# Patient Record
Sex: Male | Born: 1975 | Race: White | Hispanic: No | Marital: Single | Smoking: Current every day smoker
Health system: Southern US, Community
[De-identification: ages and names within clinical notes are randomized; demographics above are authoritative.]

## PROBLEM LIST (undated history)

## (undated) DIAGNOSIS — E785 Hyperlipidemia, unspecified: Secondary | ICD-10-CM

## (undated) DIAGNOSIS — E039 Hypothyroidism, unspecified: Secondary | ICD-10-CM

## (undated) DIAGNOSIS — E079 Disorder of thyroid, unspecified: Secondary | ICD-10-CM

## (undated) DIAGNOSIS — E119 Type 2 diabetes mellitus without complications: Secondary | ICD-10-CM

## (undated) DIAGNOSIS — F900 Attention-deficit hyperactivity disorder, predominantly inattentive type: Secondary | ICD-10-CM

## (undated) HISTORY — DX: Type 2 diabetes mellitus without complications: E11.9

## (undated) HISTORY — DX: Hyperlipidemia, unspecified: E78.5

## (undated) HISTORY — DX: Attention-deficit hyperactivity disorder, predominantly inattentive type: F90.0

## (undated) HISTORY — PX: NO PAST SURGERIES: SHX2092

## (undated) HISTORY — DX: Disorder of thyroid, unspecified: E07.9

## (undated) HISTORY — PX: DENTAL SURGERY: SHX609

---
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
## 2006-11-04 ENCOUNTER — Other Ambulatory Visit: Payer: Self-pay

## 2006-11-04 ENCOUNTER — Emergency Department: Payer: Self-pay | Admitting: Emergency Medicine

## 2007-01-31 ENCOUNTER — Emergency Department: Payer: Self-pay | Admitting: Emergency Medicine

## 2018-02-07 ENCOUNTER — Ambulatory Visit: Payer: Self-pay | Admitting: Family Medicine

## 2018-02-11 ENCOUNTER — Ambulatory Visit: Payer: Medicaid Other | Attending: Family Medicine | Admitting: Family Medicine

## 2018-02-11 ENCOUNTER — Encounter: Payer: Self-pay | Admitting: Family Medicine

## 2018-02-11 ENCOUNTER — Telehealth: Payer: Self-pay | Admitting: Family Medicine

## 2018-02-11 VITALS — BP 108/66 | HR 62 | Temp 98.7°F | Resp 18 | Ht 70.0 in | Wt 228.0 lb

## 2018-02-11 DIAGNOSIS — G8929 Other chronic pain: Secondary | ICD-10-CM | POA: Insufficient documentation

## 2018-02-11 DIAGNOSIS — Z79899 Other long term (current) drug therapy: Secondary | ICD-10-CM | POA: Diagnosis not present

## 2018-02-11 DIAGNOSIS — E1042 Type 1 diabetes mellitus with diabetic polyneuropathy: Secondary | ICD-10-CM

## 2018-02-11 DIAGNOSIS — M25511 Pain in right shoulder: Secondary | ICD-10-CM | POA: Diagnosis not present

## 2018-02-11 DIAGNOSIS — Z833 Family history of diabetes mellitus: Secondary | ICD-10-CM | POA: Diagnosis not present

## 2018-02-11 DIAGNOSIS — E109 Type 1 diabetes mellitus without complications: Secondary | ICD-10-CM | POA: Diagnosis present

## 2018-02-11 DIAGNOSIS — E1065 Type 1 diabetes mellitus with hyperglycemia: Secondary | ICD-10-CM | POA: Diagnosis not present

## 2018-02-11 DIAGNOSIS — F419 Anxiety disorder, unspecified: Secondary | ICD-10-CM

## 2018-02-11 DIAGNOSIS — F17210 Nicotine dependence, cigarettes, uncomplicated: Secondary | ICD-10-CM | POA: Diagnosis not present

## 2018-02-11 DIAGNOSIS — Z794 Long term (current) use of insulin: Secondary | ICD-10-CM | POA: Insufficient documentation

## 2018-02-11 DIAGNOSIS — M5441 Lumbago with sciatica, right side: Secondary | ICD-10-CM

## 2018-02-11 DIAGNOSIS — E039 Hypothyroidism, unspecified: Secondary | ICD-10-CM | POA: Insufficient documentation

## 2018-02-11 DIAGNOSIS — E785 Hyperlipidemia, unspecified: Secondary | ICD-10-CM | POA: Diagnosis not present

## 2018-02-11 HISTORY — DX: Other chronic pain: G89.29

## 2018-02-11 LAB — POCT URINALYSIS DIP (CLINITEK)
Bilirubin, UA: NEGATIVE
Blood, UA: NEGATIVE
Glucose, UA: 500 mg/dL — AB
Ketones, POC UA: NEGATIVE mg/dL
Leukocytes, UA: NEGATIVE
Nitrite, UA: NEGATIVE
POC PROTEIN,UA: NEGATIVE
Spec Grav, UA: 1.015
Urobilinogen, UA: 1 U/dL
pH, UA: 6

## 2018-02-11 LAB — GLUCOSE, POCT (MANUAL RESULT ENTRY): POC Glucose: 300 mg/dL — AB (ref 70–99)

## 2018-02-11 MED ORDER — VENLAFAXINE HCL ER 75 MG PO CP24: 75.0000 mg | ORAL_CAPSULE | Freq: Every day | ORAL | 1 refills | Status: DC

## 2018-02-11 MED ORDER — GLUCOSE BLOOD VI STRP: ORAL_STRIP | 3 refills | Status: DC

## 2018-02-11 MED ORDER — DULOXETINE HCL 60 MG PO CPEP: 60.0000 mg | ORAL_CAPSULE | Freq: Every day | ORAL | 1 refills | Status: DC

## 2018-02-11 MED ORDER — ATORVASTATIN CALCIUM 40 MG PO TABS: 40.0000 mg | ORAL_TABLET | Freq: Every day | ORAL | 1 refills | Status: DC

## 2018-02-11 MED ORDER — METHOCARBAMOL 500 MG PO TABS: 1000.0000 mg | ORAL_TABLET | Freq: Three times a day (TID) | ORAL | 0 refills | Status: DC

## 2018-02-11 MED ORDER — LEVOTHYROXINE SODIUM 175 MCG PO TABS: 175.0000 ug | ORAL_TABLET | Freq: Every day | ORAL | 5 refills | Status: DC

## 2018-02-11 MED ORDER — LISINOPRIL 5 MG PO TABS: 5.0000 mg | ORAL_TABLET | Freq: Every day | ORAL | 3 refills | Status: DC

## 2018-02-11 MED ORDER — GABAPENTIN 300 MG PO CAPS: 300.0000 mg | ORAL_CAPSULE | Freq: Every day | ORAL | 3 refills | Status: DC

## 2018-02-11 MED ORDER — INSULIN NPH ISOPHANE & REGULAR (70-30) 100 UNIT/ML ~~LOC~~ SUSP: SUBCUTANEOUS | 4 refills | Status: DC

## 2018-02-11 NOTE — Telephone Encounter (Signed)
Pt called to request a refill on his test strips, call transferred to [facility]
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
## 2018-02-12 ENCOUNTER — Telehealth: Payer: Self-pay | Admitting: Family Medicine

## 2018-02-12 ENCOUNTER — Other Ambulatory Visit: Payer: Self-pay

## 2018-02-12 DIAGNOSIS — E109 Type 1 diabetes mellitus without complications: Secondary | ICD-10-CM

## 2018-02-12 LAB — COMPREHENSIVE METABOLIC PANEL WITH GFR
ALT: 17 IU/L (ref 0–44)
AST: 31 IU/L (ref 0–40)
Albumin/Globulin Ratio: 1.8 (ref 1.2–2.2)
Albumin: 4.2 g/dL (ref 4.0–5.0)
Alkaline Phosphatase: 55 IU/L (ref 39–117)
BUN/Creatinine Ratio: 11 (ref 9–20)
BUN: 16 mg/dL (ref 6–24)
Bilirubin Total: 0.5 mg/dL (ref 0.0–1.2)
CO2: 25 mmol/L (ref 20–29)
Calcium: 9.5 mg/dL (ref 8.7–10.2)
Chloride: 95 mmol/L — ABNORMAL LOW (ref 96–106)
Creatinine, Ser: 1.4 mg/dL — ABNORMAL HIGH (ref 0.76–1.27)
GFR calc Af Amer: 70 mL/min/1.73
GFR calc non Af Amer: 61 mL/min/1.73
Globulin, Total: 2.4 g/dL (ref 1.5–4.5)
Glucose: 279 mg/dL — ABNORMAL HIGH (ref 65–99)
Potassium: 4.6 mmol/L (ref 3.5–5.2)
Sodium: 134 mmol/L (ref 134–144)
Total Protein: 6.6 g/dL (ref 6.0–8.5)

## 2018-02-12 LAB — HEMOGLOBIN A1C
Est. average glucose Bld gHb Est-mCnc: 229 mg/dL
Hgb A1c MFr Bld: 9.6 % — ABNORMAL HIGH (ref 4.8–5.6)

## 2018-02-12 LAB — TSH+FREE T4
Free T4: <0.11 ng/dL — ABNORMAL LOW (ref 0.82–1.77)
TSH: 124.6 u[IU]/mL — ABNORMAL HIGH (ref 0.450–4.500)

## 2018-02-12 MED ORDER — ONETOUCH VERIO W/DEVICE KIT: PACK | 0 refills | Status: DC

## 2018-02-12 MED ORDER — ONETOUCH DELICA LANCETS 33G MISC: 2 refills | Status: DC

## 2018-02-12 MED ORDER — GLUCOSE BLOOD VI STRP: ORAL_STRIP | 2 refills | Status: DC

## 2018-02-15 ENCOUNTER — Telehealth (INDEPENDENT_AMBULATORY_CARE_PROVIDER_SITE_OTHER): Payer: Self-pay

## 2018-02-15 NOTE — Telephone Encounter (Signed)
Patient is aware of all results per Dr. [name]. Nat Christen, CMA

## 2018-02-15 NOTE — Telephone Encounter (Signed)
-----   Message from [name], MD sent at 02/15/2018 12:36 AM EST ----- Please notify patient that his glucose was elevated at 279 on his recent blood work.  Patient also with an elevation in his serum creatinine which is a measurement of his kidney function.  Patient's creatinine was 1.40 with normal being 0.76-1.27.  Patient's recent hemoglobin A1c was 9.6 which indicates poor control of his blood sugars over the past 90 days.  Patient's goal hemoglobin A1c is 7.0 which is an average blood sugar of around 140.  Patient's TSH is very elevated at 124 with normal being 0.82-4.50.  Patient needs to make sure that he is taking the levothyroxine 175 mcg daily.  If he has been taking his thyroid medication every day then he will need an increase in the dose of this medication.  Patient should make sure that he is taking the medication on an empty stomach and then waiting at least 30 minutes to an hour before he has any other food.  If patient cannot take the medication first thing in the morning then he should wait and take the medication about 2 to 3 hours after eating his evening meal.  Patient should keep follow-up appointment and if no appointment is scheduled and he may wish to make an appointment in the next 4 to 6 weeks

## 2018-02-16 ENCOUNTER — Encounter: Payer: Self-pay | Admitting: Family Medicine

## 2018-02-19 ENCOUNTER — Encounter (INDEPENDENT_AMBULATORY_CARE_PROVIDER_SITE_OTHER): Payer: Self-pay | Admitting: Family Medicine

## 2018-02-19 ENCOUNTER — Telehealth: Payer: Self-pay | Admitting: Licensed Clinical Social Worker

## 2018-02-19 ENCOUNTER — Ambulatory Visit (INDEPENDENT_AMBULATORY_CARE_PROVIDER_SITE_OTHER): Payer: Self-pay

## 2018-02-19 ENCOUNTER — Ambulatory Visit (INDEPENDENT_AMBULATORY_CARE_PROVIDER_SITE_OTHER): Payer: Medicaid Other | Admitting: Family Medicine

## 2018-02-19 DIAGNOSIS — M5441 Lumbago with sciatica, right side: Secondary | ICD-10-CM | POA: Diagnosis not present

## 2018-02-19 DIAGNOSIS — M25511 Pain in right shoulder: Secondary | ICD-10-CM | POA: Diagnosis not present

## 2018-02-19 MED ORDER — BACLOFEN 10 MG PO TABS: 10.0000 mg | ORAL_TABLET | Freq: Three times a day (TID) | ORAL | 1 refills | Status: DC | PRN

## 2018-02-19 NOTE — Telephone Encounter (Signed)
Call placed to patient to follow up on behavioral health consult. LCSWA left a message requesting a return call.

## 2018-02-19 NOTE — Progress Notes (Signed)
I saw and examined the patient with Dr. Okey Dupre and agree with assessment and plan as outlined.  Probable right shoulder impingement and mechanical low back pain.  Diabetes is poorly controlled.  We will avoid injection for now, refer him to physical therapy.  Baclofen as needed.  Could contemplate subacromial injection in the future if pain persist.  Incidentally he notes that his shoulder dislocated several times when he was younger, treated with close reduction.  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  Lumbar x-rays are unremarkable.
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
## 2018-02-22 ENCOUNTER — Telehealth: Payer: Self-pay | Admitting: Licensed Clinical Social Worker

## 2018-02-22 NOTE — Telephone Encounter (Signed)
Call placed to patient. LCSWA introduced self and explained role at [facility]. Pt was informed of consult from PCP to address financial and food insecurity. Pt shared that he has been experiencing financial strain and is interested in resources. LCSWA provided supportive resources for financial stress and food insecurity via mail, per pt request. No additional concerns noted.

## 2018-03-04 ENCOUNTER — Telehealth: Payer: Self-pay | Admitting: Pharmacist

## 2018-03-04 ENCOUNTER — Ambulatory Visit: Payer: Medicaid Other | Attending: Family Medicine | Admitting: Pharmacist

## 2018-03-04 ENCOUNTER — Other Ambulatory Visit: Payer: Self-pay | Admitting: Family Medicine

## 2018-03-04 ENCOUNTER — Encounter: Payer: Self-pay | Admitting: Pharmacist

## 2018-03-04 DIAGNOSIS — Z7901 Long term (current) use of anticoagulants: Secondary | ICD-10-CM | POA: Diagnosis not present

## 2018-03-04 DIAGNOSIS — Z833 Family history of diabetes mellitus: Secondary | ICD-10-CM | POA: Diagnosis not present

## 2018-03-04 DIAGNOSIS — Z23 Encounter for immunization: Secondary | ICD-10-CM | POA: Diagnosis not present

## 2018-03-04 DIAGNOSIS — E109 Type 1 diabetes mellitus without complications: Secondary | ICD-10-CM | POA: Diagnosis present

## 2018-03-04 DIAGNOSIS — Z794 Long term (current) use of insulin: Secondary | ICD-10-CM | POA: Insufficient documentation

## 2018-03-04 DIAGNOSIS — E1040 Type 1 diabetes mellitus with diabetic neuropathy, unspecified: Secondary | ICD-10-CM | POA: Diagnosis not present

## 2018-03-04 DIAGNOSIS — E1042 Type 1 diabetes mellitus with diabetic polyneuropathy: Secondary | ICD-10-CM

## 2018-03-04 DIAGNOSIS — E785 Hyperlipidemia, unspecified: Secondary | ICD-10-CM | POA: Insufficient documentation

## 2018-03-04 MED ORDER — INSULIN NPH ISOPHANE & REGULAR (70-30) 100 UNIT/ML ~~LOC~~ SUSP: SUBCUTANEOUS | 4 refills | Status: DC

## 2018-03-04 MED ORDER — PNEUMOCOCCAL VAC POLYVALENT 25 MCG/0.5ML IJ INJ
0.5000 mL | INJECTION | Freq: Once | INTRAMUSCULAR | Status: AC
  Administered 2018-03-04: 0.5 mL via INTRAMUSCULAR

## 2018-03-04 MED ORDER — TETANUS-DIPHTH-ACELL PERTUSSIS 5-2.5-18.5 LF-MCG/0.5 IM SUSP
0.5000 mL | Freq: Once | INTRAMUSCULAR | Status: AC
  Administered 2018-03-04: 0.5 mL via INTRAMUSCULAR

## 2018-03-04 NOTE — Telephone Encounter (Signed)
OK. I will place the referrals. Thank you for your assistance with the patients

## 2018-03-04 NOTE — Telephone Encounter (Signed)
Dr. [name],   Saw this patient today for DM management. He reported hypoglycemia overnight with higher sugars during the day. Adjusted his Novolin 70/30 accordingly. We also administered Adacel and Pneumovax.   Of note, pt expressed interest in CBGM and an insulin pump. I told him I would make you aware. He would benefit from an Endo referral. Additionally, he is requesting a referral to an ophthalmologist.   Thanks for all you do!

## 2018-03-04 NOTE — Progress Notes (Signed)
    S:    PCP: Dr. [name]  No chief complaint on file.  43 YO caucasian male with PMH of T1DM, hyperlipidemia, neuropathy.  Patient arrives in good spirits. Presents for diabetes management at the request of Dr. [name]. Patient was referred on 02/11/18. He has a history of very labile blood sugars - reports that he has tried Lantus in the past. Reports that his sugars would not come down out of the 600s with Lantus 60 units daily.   Patient reports diabetes was diagnosed between age [age].   Family/Social History:  - DM (sister, mother, MGM) - 0.5 PPD - Denies using alcohol  Insurance coverage/medication affordability:  [place] [employer]  Patient denies adherence with medications.  Current diabetes medications include:  - Novolin 70/30 38 units qAM and 40 units qPM (pt reports taking 45 qAM and 43 qPM) Current hypertension/ASCVD benefit medications include: - Lisinopril 2.5 mg daily - Atorvastatin 20 mg daily (takes 1/2 of the 40 mg tablet)  Patient reports hypoglycemic events. Pt reports overnight sugars 20s - 60s.   Patient reported dietary habits: Eats 3 meals/day - Fast food based diet  Patient-reported exercise habits:  - Walks throughout the day   Patient denies nocturia.  Patient reports neuropathy.  Patient denies visual changes. Patient reports self foot exams.   O:  POCT glucose: 372 Home fasting CBG: 60 - 200s Home before lunch: 300s Home before dinner: 100s - 300s Home before bedtime/overnight: 20s - 100s  Lab Results  Component Value Date   HGBA1C 9.6 (H) 02/11/2018   There were no vitals filed for this visit.  Lipid Panel  No results found for: CHOL, TRIG, HDL, CHOLHDL, VLDL, LDLCALC, LDLDIRECT   Clinical ASCVD: No  The ASCVD Risk score Mikey Bussing DC Jr., et al., 2013) failed to calculate for the following reasons:   Cannot find a previous HDL lab   Cannot find a previous total cholesterol lab   A/P: Diabetes longstanding currently uncontrolled. Patient is  able to verbalize appropriate hypoglycemia management plan. Patient is not adherent with medication and takes insulin differently than prescribed. Control is suboptimal due to dietary indiscretion.   Discussed need for Endo referral with patient. Will message his PCP regarding this. Novolin 70/30 is not ideal but pt does not wish to change insulins at this time.   -Decreased dose of Novolin 70/30 in the evening from 43 to 40 units.  -Increased dose of Novolin 70/30 in the morning from 45 to 47 units.  -Extensively discussed pathophysiology of DM, recommended lifestyle interventions, dietary effects on glycemic control -Counseled on s/sx of and management of hypoglycemia -HM: tetanus, flu, PNA (pneumovax) indicated; Pneumovax and Adacel given -Next A1C anticipated 04/2018.   ASCVD risk - primary prevention in patient with DM. Will get lipid panel - at least moderate intensity indicated at this time. Pt is tolerating atorvastatin well.  -Continued atorvastatin 20 mg.   Written patient instructions provided.  Total time in face to face counseling 30 minutes.   Follow up with PCP 04/08/2018.     Patient seen with: Beckey Rutter, PharmD Candidate  [employer]  Class of 2022  Benard Halsted, PharmD, [employer] [phone]

## 2018-03-04 NOTE — Progress Notes (Signed)
Patient ID: Ronald Banks, male   DOB: [birth year], 43 y.o.   MRN: [id number]   Patient was seen earlier today by clinical pharmacist in follow-up of type 1 diabetes.  Per note from clinical pharmacist, patient is interested in obtaining an insulin pump and patient also expressed need for diabetic eye exam.  Orders will be placed for endocrinology referral and ophthalmology referral.

## 2018-03-04 NOTE — Patient Instructions (Signed)
Thank you for coming to see me today. Please do the following:  1. Please take 47 units in the morning before breakfast and 40 units before dinner.  2. Continue checking blood sugars at home.  3. Continue making the lifestyle changes we've discussed together during our visit. Diet and exercise play a significant role in improving your blood sugars.  4. Follow-up with your PCP.    Hypoglycemia or low blood sugar:   Low blood sugar can happen quickly and may become an emergency if not treated right away.   While this shouldn't happen often, it can be brought upon if you skip a meal or do not eat enough. Also, if your insulin or other diabetes medications are dosed too high, this can cause your blood sugar to go to low.   Warning signs of low blood sugar include: 1. Feeling shaky or dizzy 2. Feeling weak or tired  3. Excessive hunger 4. Feeling anxious or upset  5. Sweating even when you aren't exercising  What to do if I experience low blood sugar? 1. Check your blood sugar with your meter. If lower than 70, proceed to step 2.  2. Treat with 3-4 glucose tablets or 3 packets of regular sugar. If these aren't around, you can try hard candy. Yet another option would be to drink 4 ounces of fruit juice or 6 ounces of REGULAR soda.  3. Re-check your sugar in 15 minutes. If it is still below 70, do what you did in step 2 again. If has come back up, go ahead and eat a snack or small meal at this time.

## 2018-03-05 ENCOUNTER — Other Ambulatory Visit: Payer: Self-pay

## 2018-03-05 ENCOUNTER — Ambulatory Visit: Payer: Medicaid Other | Attending: Family Medicine

## 2018-03-05 ENCOUNTER — Other Ambulatory Visit: Payer: Self-pay | Admitting: Pharmacist

## 2018-03-05 DIAGNOSIS — M25511 Pain in right shoulder: Secondary | ICD-10-CM | POA: Diagnosis not present

## 2018-03-05 DIAGNOSIS — R293 Abnormal posture: Secondary | ICD-10-CM | POA: Insufficient documentation

## 2018-03-05 DIAGNOSIS — M62830 Muscle spasm of back: Secondary | ICD-10-CM

## 2018-03-05 DIAGNOSIS — M545 Low back pain, unspecified: Secondary | ICD-10-CM

## 2018-03-05 DIAGNOSIS — G8929 Other chronic pain: Secondary | ICD-10-CM | POA: Insufficient documentation

## 2018-03-05 LAB — LIPID PANEL
Chol/HDL Ratio: 5.1 ratio — ABNORMAL HIGH (ref 0.0–5.0)
Cholesterol, Total: 298 mg/dL — ABNORMAL HIGH (ref 100–199)
HDL: 59 mg/dL
LDL Calculated: 214 mg/dL — ABNORMAL HIGH (ref 0–99)
Triglycerides: 124 mg/dL (ref 0–149)
VLDL Cholesterol Cal: 25 mg/dL (ref 5–40)

## 2018-03-05 LAB — MICROALBUMIN / CREATININE URINE RATIO
Creatinine, Urine: 91.2 mg/dL
Microalb/Creat Ratio: 7 mg/g{creat} (ref 0–29)
Microalbumin, Urine: 6 ug/mL

## 2018-03-05 MED ORDER — ACCU-CHEK GUIDE W/DEVICE KIT: 1.0000 | PACK | Freq: Every day | 0 refills | Status: DC

## 2018-03-05 MED ORDER — ACCU-CHEK FASTCLIX LANCETS MISC: 11 refills | Status: DC

## 2018-03-05 MED ORDER — GLUCOSE BLOOD VI STRP: ORAL_STRIP | 11 refills | Status: DC

## 2018-03-05 NOTE — Therapy (Signed)
[place], [place], [address] Phone: [phone]   Fax:  [phone]  Physical Therapy Evaluation  Patient Details  Name: Ronald Banks MRN: [id number] Date of Birth: 08-26-1975 Referring Provider (PT): Eunice Blase, MD   Encounter Date: 03/05/2018  PT End of Session - 03/05/18 1057    Visit Number  1    Number of Visits  12    Date for PT Re-Evaluation  04/26/18    Authorization Type  MCD    PT Start Time  1050    PT Stop Time  1140    PT Time Calculation (min)  50 min    Activity Tolerance  Patient tolerated treatment well    Behavior During Therapy  [facility] for tasks assessed/performed       Past Medical History:  Diagnosis Date  . Diabetes mellitus without complication ([place])    type 1     Past Surgical History:  Procedure Laterality Date  . NO PAST SURGERIES      There were no vitals filed for this visit.   Subjective Assessment - 03/05/18 1106    Subjective  He reports no injury.  Pain runs down RT arm.   MRI to assess this.       Limitations  Lifting    How long can you sit comfortably?  10 min    How long can you stand comfortably?  10 min    How long can you walk comfortably?  30-60 min    Diagnostic tests  xray:     Patient Stated Goals  He wants to get it right.     Currently in Pain?  Yes    Pain Score  8     Pain Location  Back    Pain Orientation  Lower   center   Pain Descriptors / Indicators  --   painful   Pain Type  Chronic pain    Pain Onset  More than a month ago    Pain Frequency  Constant    Aggravating Factors   lifting walk/stand /sit    Pain Relieving Factors  Does not do anything.  better with arm across chest    Multiple Pain Sites  Yes    Pain Score  7    Pain Location  Shoulder    Pain Orientation  Right    Pain Descriptors / Indicators  Radiating    Pain Type  Chronic pain    Pain Radiating Towards  RT arm along radial aspe t RT forarm to tips ot RT  fingers    Pain Onset  More than a month ago    Pain Frequency  Constant    Aggravating Factors   reaching and generaly using RT arm    Pain Relieving Factors  nothing         [facility] PT Assessment - 03/05/18 0001      Assessment   Medical Diagnosis  chronic RT shoulder and back pain    Referring Provider (PT)  Eunice Blase, MD    Onset Date/Surgical Date  --   5 months ago shoulder, 9 months ago   Hand Dominance  Right    Next MD Visit  04/08/2018    Prior Therapy  No      Precautions   Precautions  None      Restrictions   Weight Bearing Restrictions  No      Balance Screen   Has the patient fallen  in the past 6 months  Yes    How many times?  1   climbing a ladder   Has the patient had a decrease in activity level because of a fear of falling?   No    Is the patient reluctant to leave their home because of a fear of falling?   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      Cognition   Overall Cognitive Status  Within Functional Limits for tasks assessed      Posture/Postural Control   Posture Comments  LT shoulder higher than RT, appears slight shift to Lt  possible scoliosis      ROM / Strength   AROM / PROM / Strength  AROM;Strength;PROM      AROM   AROM Assessment Site  Lumbar;Shoulder;Cervical    Right/Left Shoulder  Right    Right Shoulder Flexion  110 Degrees    Right Shoulder ABduction  85 Degrees    Right Shoulder Internal Rotation  57 Degrees    Right Shoulder External Rotation  45 Degrees    Cervical Flexion  60    Cervical Extension  50    Cervical - Right Side Bend  42    Cervical - Left Side Bend  40    Cervical - Right Rotation  62   with SB and ext pain RT neck and to shoulder   Cervical - Left Rotation  62    Lumbar Flexion  55    Lumbar Extension  20    Lumbar - Right Side Bend  10     Lumbar - Left Side Bend  10      Strength   Overall Strength Comments  weakness of rT trap with RT trap elongated.    UE strength appears WNL thouugh hesitant with biceps testing  probably due to pain    poor to fair abdominals         Flexibility   Soft Tissue Assessment /Muscle Length  yes    Hamstrings  70 degrees bilaterally                Objective measurements completed on examination: See above findings.              PT Education - 03/05/18 1149    Education Details  POc , HEP    Person(s) Educated  Patient    Methods  Explanation;Tactile cues;Verbal cues;Handout;Demonstration    Comprehension  Verbalized understanding;Returned demonstration       PT Short Term Goals - 03/05/18 1059      PT SHORT TERM GOAL #1   Title  He will be independent with all initial HEP issued    Baseline  no progfram    Time  2    Period  Weeks    Status  New        PT Long Term Goals - 03/05/18 1059      PT LONG TERM GOAL #1   Title  He will be indpendent with all hEp issued    Baseline  He is independent with initial HEP     Time  6    Period  Weeks    Status  New      PT LONG TERM GOAL #2   Title  He will report 50% decr RT shoulder pain and able to reach overhead smoothly    Baseline  unable to each overhead and has severe pain    Time  6    Period  Weeks    Status  New      PT LONG TERM GOAL #3   Title  He will report 50% decr back pain    Baseline  severe back pain with sitting for 5 min    Time  6    Period  Weeks    Status  New      PT LONG TERM GOAL #4   Title  He will be able to sit with support without incr back pain     Baseline  severe at 30 min    Time  6    Period  Weeks    Status  New      PT LONG TERM GOAL #5   Title  He will be able to stand and walk for 20-30 min before back pain increases    Baseline  5 min at eval    Time  6    Period  Weeks    Status  New             Plan - 03/05/18 1057    Clinical Impression  Statement  Mr Reasons  presents with chronic pain limiting use of RT arm and all activity due to back pain.  He has limited motion of RT shoulder and lower back .  He is stiff in lowest lumbar spine but with good mobility in  part of spine above this.  Pelvis and spine appear level.  He  has fair abdominals and strength WNL in arm and legs.   He does have some asymetry with slpoing RT shoulder compared to LT shoulder and forward head posture.  Affect does not indicate high pain level. Skilled PT should help improve function in shoulder and back.     History and Personal Factors relevant to plan of care:  diabetes chronic pain, does not medicate even with higher rpeorted levels of pain.  pevious dislocations RT shoulder when younger.     Clinical Presentation  Unstable    Clinical Presentation due to:  shoulder and back pain limitiing fucntion     Clinical Decision Making  Low    Rehab Potential  Good    PT Frequency  --   3 visits   PT Duration  2 weeks   then 2x/week for 4 weeks    PT Treatment/Interventions  Taping;Passive range of motion;Manual techniques;Patient/family education;Therapeutic exercise;Cryotherapy;Moist Heat;Ultrasound;Iontophoresis 18m/ml Dexamethasone;Electrical Stimulation;Dry needling    PT Next Visit Plan  progress HEP and use modalities /manual as needed for pain    PT Home Exercise Plan  shoulder table and wall slides, reaching behind back       Consulted and Agree with Plan of Care  Patient       Patient will benefit from skilled therapeutic intervention in order to improve the following deficits and impairments:  Pain, Postural dysfunction, Impaired UE functional use, Decreased activity tolerance, Decreased strength, Increased muscle spasms, Improper body mechanics, Decreased range of motion  Visit Diagnosis: Chronic right shoulder pain  Chronic bilateral low back pain without sciatica  Muscle spasm of back  Abnormal posture     Problem List Patient Active  Problem List   Diagnosis Date Noted  . Diabetic peripheral neuropathy associated with type 1 diabetes mellitus ([place] 02/11/2018  .  Hyperlipidemia LDL goal <70 02/11/2018  . Hypothyroidism 02/11/2018  . Chronic right shoulder pain 02/11/2018  . Chronic midline low back pain with right-sided sciatica 02/11/2018  . Anxiety 02/11/2018    Darrel Hoover  PT 03/05/2018, 11:50 AM  [facility] [address] [place], [place], [address] Phone: [phone]   Fax:  [phone]  Name: Ozan Maclay MRN: [id number] Date of Birth: 1976/01/05

## 2018-03-05 NOTE — Patient Instructions (Signed)
Wall and table slides x 3-5 rps 3x/day for 5-10 sec,  Behind back 3-5 reps  RT arm  3x/day 5-010 sec

## 2018-03-07 ENCOUNTER — Other Ambulatory Visit: Payer: Self-pay | Admitting: Family Medicine

## 2018-03-07 DIAGNOSIS — E785 Hyperlipidemia, unspecified: Secondary | ICD-10-CM

## 2018-03-07 MED ORDER — ATORVASTATIN CALCIUM 40 MG PO TABS: 40.0000 mg | ORAL_TABLET | Freq: Every day | ORAL | 1 refills | Status: DC

## 2018-03-07 NOTE — Progress Notes (Signed)
Patient ID: Ronald Banks, male   DOB: April 09, 1975, 43 y.o.   MRN: [id number]   Patient's recent lipid panel with elevated total cholesterol and LDL and patient will be notified by CMA to increase his lipitor to 40 mg and new RX will be sent into patient's pharmacy.

## 2018-03-08 ENCOUNTER — Telehealth: Payer: Self-pay | Admitting: *Deleted

## 2018-03-08 NOTE — Telephone Encounter (Signed)
-----   Message from [name], MD sent at 03/07/2018 11:02 PM EST ----- Normal urine microalbumin; lipid panel with elevated cholesterol of 298 and LDLelevated at 214. Please increase atorvastatin to 40 mg -one whole pill daily. New RX will be sent to patient's pharmacy

## 2018-03-08 NOTE — Telephone Encounter (Signed)
Patient verified DOB Patient is aware of kidney function being normal but needing to increase to 40 mg of atorvastatin daily. Patient is aware of this change and will follow up as planned.

## 2018-03-12 ENCOUNTER — Ambulatory Visit: Payer: Medicaid Other

## 2018-03-14 ENCOUNTER — Ambulatory Visit: Payer: Medicaid Other

## 2018-03-14 LAB — HM DIABETES EYE EXAM

## 2018-03-19 ENCOUNTER — Ambulatory Visit: Payer: Medicaid Other

## 2018-03-19 DIAGNOSIS — M545 Low back pain: Secondary | ICD-10-CM

## 2018-03-19 DIAGNOSIS — M25511 Pain in right shoulder: Secondary | ICD-10-CM | POA: Diagnosis not present

## 2018-03-19 DIAGNOSIS — G8929 Other chronic pain: Secondary | ICD-10-CM

## 2018-03-19 DIAGNOSIS — M62830 Muscle spasm of back: Secondary | ICD-10-CM

## 2018-03-19 DIAGNOSIS — R293 Abnormal posture: Secondary | ICD-10-CM

## 2018-03-19 NOTE — Therapy (Addendum)
[place], [place], [address] Phone: [phone]   Fax:  [phone]  Physical Therapy Treatment  Patient Details  Name: Ronald Banks MRN: [id number] Date of Birth: [birth year] Referring Provider (PT): Eunice Blase, MD   Encounter Date: 03/19/2018  PT End of Session - 03/19/18 1014    Visit Number  2   Number of Visits  12    Date for PT Re-Evaluation  04/26/18    Authorization Type  MCD    Authorization Time Period  2/17 to 03/31/18    Authorization - Visit Number  1    Authorization - Number of Visits  3    PT Start Time  5188    PT Stop Time  1112    PT Time Calculation (min)  57 min    Activity Tolerance  Patient tolerated treatment well    Behavior During Therapy  [facility] for tasks assessed/performed       Past Medical History:  Diagnosis Date  . Diabetes mellitus without complication ([place])    type 1     Past Surgical History:  Procedure Laterality Date  . NO PAST SURGERIES      There were no vitals filed for this visit.  Subjective Assessment - 03/19/18 1017    Subjective  Shoulder is some better but still limited in ROM.  HAve worked on exercise    Pain Score  7     Pain Location  Back    Pain Orientation  Lower    Pain Descriptors / Indicators  --   pain   Pain Type  Chronic pain    Pain Onset  More than a month ago    Pain Frequency  Constant    Aggravating Factors   lift stand walk    Pain Relieving Factors  None    Pain Score  6    Pain Location  Shoulder    Pain Orientation  Right    Pain Descriptors / Indicators  Radiating    Pain Type  Chronic pain    Pain Onset  More than a month ago    Pain Frequency  Constant    Aggravating Factors   using R arm    Pain Relieving Factors  exercise                       [facility] Adult PT Treatment/Exercise - 03/19/18 0001      Exercises   Exercises  Shoulder;Lumbar      Lumbar Exercises: Stretches   Single Knee to  Chest Stretch  Right;Left;30 seconds      Lumbar Exercises: Supine   Ab Set  10 reps    Pelvic Tilt  10 reps    Pelvic Tilt Limitations  much cuing initially    Clam  10 reps    Clam Limitations  RT and left cued to keep abs set      Lumbar Exercises: Prone   Straight Leg Raise  10 reps    Straight Leg Raises Limitations  RT/Lt       Lumbar Exercises: Quadruped   Madcat/Old Horse  10 reps    Madcat/Old Horse Limitations  verbal and manual cues.       Shoulder Exercises: Prone   Other Prone Exercises  scapula retraction  x 12 and scapula elevatioth arm over side of bed and with arm by side n x 12 RT  Shoulder Exercises: Standing   Extension  15 reps    Theraband Level (Shoulder Extension)  Level 2 (Red)    Row  Both;15 reps    Theraband Level (Shoulder Row)  Level 2 (Red)    Other Standing Exercises  scapula elevations and retraction x 15 each      Shoulder Exercises: Pulleys   Flexion  2 minutes      Shoulder Exercises: ROM/Strengthening   UBE (Upper Arm Bike)  L1 5 min forward      Modalities   Modalities  Moist Heat      Moist Heat Therapy   Number Minutes Moist Heat  15 Minutes    Moist Heat Location  Shoulder;Lumbar Spine   supine     Manual Therapy   Manual Therapy  Joint mobilization    Manual therapy comments  Gr 2-3  throacic and lumbar spine               PT Short Term Goals - 03/05/18 1059      PT SHORT TERM GOAL #1   Title  He will be independent with all initial HEP issued    Baseline  no progfram    Time  2    Period  Weeks    Status  New        PT Long Term Goals - 03/05/18 1059      PT LONG TERM GOAL #1   Title  He will be indpendent with all hEp issued    Baseline  He is independent with initial HEP     Time  6    Period  Weeks    Status  New      PT LONG TERM GOAL #2   Title  He will report 50% decr RT shoulder pain and able to reach overhead smoothly    Baseline  unable to each overhead and has severe pain    Time   6    Period  Weeks    Status  New      PT LONG TERM GOAL #3   Title  He will report 50% decr back pain    Baseline  severe back pain with sitting for 5 min    Time  6    Period  Weeks    Status  New      PT LONG TERM GOAL #4   Title  He will be able to sit with support without incr back pain     Baseline  severe at 30 min    Time  6    Period  Weeks    Status  New      PT LONG TERM GOAL #5   Title  He will be able to stand and walk for 20-30 min before back pain increases    Baseline  5 min at eval    Time  6    Period  Weeks    Status  New            Plan - 03/19/18 1015    Clinical Impression Statement  He was able to do all exercise without incr pain. Shoulder ROm improved    Will add to HEp for back and shoulder next session    PT Treatment/Interventions  Taping;Passive range of motion;Manual techniques;Patient/family education;Therapeutic exercise;Cryotherapy;Moist Heat;Ultrasound;Iontophoresis 54m/ml Dexamethasone;Electrical Stimulation;Dry needling    PT Next Visit Plan  Cont heat and strengthening , Sdd to HEP for back and  bands to shoulder ,  prone scapula    PT Home Exercise Plan  shoulder table and wall slides, reaching behind back       Consulted and Agree with Plan of Care  Patient       Patient will benefit from skilled therapeutic intervention in order to improve the following deficits and impairments:  Pain, Postural dysfunction, Impaired UE functional use, Decreased activity tolerance, Decreased strength, Increased muscle spasms, Improper body mechanics, Decreased range of motion, Impaired vision/preception  Visit Diagnosis: Chronic right shoulder pain  Chronic bilateral low back pain without sciatica  Muscle spasm of back  Abnormal posture     Problem List Patient Active Problem List   Diagnosis Date Noted  . Diabetic peripheral neuropathy associated with type 1 diabetes mellitus ([place]) 02/11/2018  . Hyperlipidemia LDL goal <70 02/11/2018   . Hypothyroidism 02/11/2018  . Chronic right shoulder pain 02/11/2018  . Chronic midline low back pain with right-sided sciatica 02/11/2018  . Anxiety 02/11/2018    Darrel Hoover  PT 03/19/2018, 11:05 AM  [facility] [address] [place], [place], [address] Phone: [phone]   Fax:  [phone]  Name: Ronald Banks MRN: [id number] Date of Birth: 30-Oct-1975

## 2018-03-21 ENCOUNTER — Ambulatory Visit: Payer: Medicaid Other

## 2018-03-21 DIAGNOSIS — M62830 Muscle spasm of back: Secondary | ICD-10-CM

## 2018-03-21 DIAGNOSIS — R293 Abnormal posture: Secondary | ICD-10-CM

## 2018-03-21 DIAGNOSIS — M25511 Pain in right shoulder: Secondary | ICD-10-CM | POA: Diagnosis not present

## 2018-03-21 DIAGNOSIS — M545 Low back pain: Secondary | ICD-10-CM

## 2018-03-21 DIAGNOSIS — G8929 Other chronic pain: Secondary | ICD-10-CM

## 2018-03-21 NOTE — Therapy (Addendum)
[place], [place], [address] Phone: [phone]   Fax:  [phone]  Physical Therapy Treatment/Discharge   Patient Details  Name: Ronald Banks MRN: [id number] Date of Birth: Sep 18, 1975 Referring Provider (PT): Eunice Blase, MD   Encounter Date: 03/21/2018  PT End of Session - 03/21/18 0919    Visit Number  3    Number of Visits  12    Date for PT Re-Evaluation  04/26/18    Authorization Type  MCD    Authorization Time Period  2/17 to 03/31/18    Authorization - Visit Number  2    Authorization - Number of Visits  3    PT Start Time  0920    PT Stop Time  1012    PT Time Calculation (min)  52 min    Activity Tolerance  Patient tolerated treatment well    Behavior During Therapy  [name] for tasks assessed/performed       Past Medical History:  Diagnosis Date  . Diabetes mellitus without complication ([place])    type 1     Past Surgical History:  Procedure Laterality Date  . NO PAST SURGERIES      There were no vitals filed for this visit.  Subjective Assessment - 03/21/18 0921    Subjective  Shoulder still better. Did the exercises back about same. Sore from exercises. Stretches don't help much for back.  Dislocated RT shoulder many times but not in past 5 years.          [facility] PT Assessment - 03/21/18 0001      Assessment   Medical Diagnosis  chronic RT shoulder and back pain    Referring Provider (PT)  Eunice Blase, MD      AROM   Right Shoulder Flexion  125 Degrees    Right Shoulder ABduction  95 Degrees    Right Shoulder Internal Rotation  60 Degrees    Right Shoulder External Rotation  52 Degrees                   [facility] Adult PT Treatment/Exercise - 03/21/18 0001      Lumbar Exercises: Aerobic   UBE (Upper Arm Bike)  l2 3 min forward , 3 min back      Lumbar Exercises: Supine   Other Supine Lumbar Exercises  isometric adduction x 10 10 sec post manual      Shoulder Exercises: Supine   Protraction  Right    Protraction Weight (lbs)  5    Protraction Limitations  25 reps      Shoulder Exercises: Standing   ABduction  Both;20 reps    Theraband Level (Shoulder ABduction)  Level 2 (Red)    Extension  Both;20 reps    Theraband Level (Shoulder Extension)  Level 3 (Green)    Row  Both;20 reps    Theraband Level (Shoulder Row)  Level 3 (Green)      Moist Heat Therapy   Number Minutes Moist Heat  12 Minutes   lumbar also with exercises   Moist Heat Location  Shoulder;Lumbar Spine      Manual Therapy   Manual Therapy  Muscle Energy Technique;Manual Traction    Manual therapy comments  Gr 2-3  lower lumbar spine, and LT lateral sacrum for rotated sacrum    Manual Traction  each leg long axis  both x 100 reps and RT 30 extra.     Muscle Energy Technique  For RT  anterior rotation , andd out flare             PT Education - 03/21/18 0932    Education Details  Improtance of strength to orient Gibbon joint and decr tissue strain in shoulder    Person(s) Educated  Patient    Methods  Explanation;Other (comment)   used model   Comprehension  Verbalized understanding       PT Short Term Goals - 03/21/18 1007      PT SHORT TERM GOAL #1   Title  He will be independent with all initial HEP issued    Status  Achieved        PT Long Term Goals - 03/21/18 1008      PT LONG TERM GOAL #1   Title  He will be indpendent with all hEp issued    Baseline  independnet with initial HEp  Need to progress strength RT shoulder and add for lower back    Time  6    Period  Weeks    Status  On-going      PT LONG TERM GOAL #2   Title  He will report 50% decr RT shoulder pain and able to reach overhead smoothly    Baseline  able to reach overhead but still painful and reports easy fatigue    Time  6    Period  Weeks    Status  On-going      PT LONG TERM GOAL #3   Title  He will report 50% decr back pain    Baseline  no change    Time  6     Period  Weeks    Status  On-going      PT LONG TERM GOAL #4   Title  He will be able to sit with support without incr back pain     Baseline  severe at 30 min    Status  On-going      PT LONG TERM GOAL #5   Title  He will be able to stand and walk for 20-30 min before back pain increases    Baseline  5 min at eval    Status  On-going            Plan - 03/21/18 0920    Clinical Impression Statement  Shoulder improving and he needs to continue strength and scapula stability . Lower back no change. Pelvic asymetries noted and treated. No worse.   May benefit from  dry needling.  Should benefitr from continued PT . 8-12 sessions    PT Treatment/Interventions  Taping;Passive range of motion;Manual techniques;Patient/family education;Therapeutic exercise;Cryotherapy;Moist Heat;Ultrasound;Iontophoresis 35m/ml Dexamethasone;Electrical Stimulation;Dry needling    PT Next Visit Plan  Cont heat and strengthening , add to HEP for back and  bands to shoulder , prone scapula    PT Home Exercise Plan  shoulder table and wall slides, reaching behind back       Consulted and Agree with Plan of Care  Patient       Patient will benefit from skilled therapeutic intervention in order to improve the following deficits and impairments:  Pain, Postural dysfunction, Impaired UE functional use, Decreased activity tolerance, Decreased strength, Increased muscle spasms, Improper body mechanics, Decreased range of motion, Impaired vision/preception  Visit Diagnosis: Chronic right shoulder pain  Chronic bilateral low back pain without sciatica  Muscle spasm of back  Abnormal posture   PHYSICAL THERAPY DISCHARGE SUMMARY  Visits from Start of Care: 3  Current  functional level related to goals / functional outcomes: Patient reports shoulder was better     Remaining deficits: Unknown/ declined to come back    Education / Equipment: HEP   Plan: Patient agrees to discharge.  Patient goals were met.  Patient is being discharged due to not returning since the last visit.  ?????       Problem List Patient Active Problem List   Diagnosis Date Noted  . Diabetic peripheral neuropathy associated with type 1 diabetes mellitus ([place]) 02/11/2018  . Hyperlipidemia LDL goal <70 02/11/2018  . Hypothyroidism 02/11/2018  . Chronic right shoulder pain 02/11/2018  . Chronic midline low back pain with right-sided sciatica 02/11/2018  . Anxiety 02/11/2018    Ronald Banks  PT 03/21/2018, 10:11 AM  [facility] [address] [place], [place], [address] Phone: [phone]   Fax:  [phone]  Name: Ronald Banks MRN: [id number] Date of Birth: [birth year]

## 2018-04-01 ENCOUNTER — Encounter: Payer: Self-pay | Admitting: Emergency Medicine

## 2018-04-01 ENCOUNTER — Emergency Department
Admission: EM | Admit: 2018-04-01 | Discharge: 2018-04-01 | Disposition: A | Discharge status: Home / Self Care | Payer: Medicaid Other | Attending: Emergency Medicine | Admitting: Emergency Medicine

## 2018-04-01 ENCOUNTER — Other Ambulatory Visit: Payer: Self-pay

## 2018-04-01 ENCOUNTER — Emergency Department: Payer: Medicaid Other

## 2018-04-01 DIAGNOSIS — Z79899 Other long term (current) drug therapy: Secondary | ICD-10-CM | POA: Diagnosis not present

## 2018-04-01 DIAGNOSIS — B349 Viral infection, unspecified: Secondary | ICD-10-CM | POA: Diagnosis not present

## 2018-04-01 DIAGNOSIS — E109 Type 1 diabetes mellitus without complications: Secondary | ICD-10-CM | POA: Diagnosis not present

## 2018-04-01 DIAGNOSIS — F17200 Nicotine dependence, unspecified, uncomplicated: Secondary | ICD-10-CM | POA: Diagnosis not present

## 2018-04-01 DIAGNOSIS — R0789 Other chest pain: Secondary | ICD-10-CM | POA: Diagnosis not present

## 2018-04-01 DIAGNOSIS — E039 Hypothyroidism, unspecified: Secondary | ICD-10-CM | POA: Insufficient documentation

## 2018-04-01 LAB — BASIC METABOLIC PANEL
Anion gap: 9 (ref 5–15)
BUN: 13 mg/dL (ref 6–20)
CO2: 25 mmol/L (ref 22–32)
Calcium: 8.7 mg/dL — ABNORMAL LOW (ref 8.9–10.3)
Chloride: 98 mmol/L (ref 98–111)
Creatinine, Ser: 1.23 mg/dL (ref 0.61–1.24)
GFR calc Af Amer: >60 mL/min (ref >60)
GFR calc non Af Amer: >60 mL/min (ref >60)
Glucose, Bld: 277 mg/dL — ABNORMAL HIGH (ref 70–99)
Potassium: 4.4 mmol/L (ref 3.5–5.1)
SODIUM: 132 mmol/L — AB (ref 135–145)

## 2018-04-01 LAB — CBC
HCT: 49 % (ref 39.0–52.0)
Hemoglobin: 16.5 g/dL (ref 13.0–17.0)
MCH: 31.6 pg (ref 26.0–34.0)
MCHC: 33.7 g/dL (ref 30.0–36.0)
MCV: 93.9 fL (ref 80.0–100.0)
NRBC: 0 % (ref 0.0–0.2)
Platelets: 265 10*3/uL (ref 150–400)
RBC: 5.22 MIL/uL (ref 4.22–5.81)
RDW: 13.3 % (ref 11.5–15.5)
WBC: 16.7 10*3/uL — AB (ref 4.0–10.5)

## 2018-04-01 LAB — GLUCOSE, CAPILLARY: Glucose-Capillary: 287 mg/dL — ABNORMAL HIGH (ref 70–99)

## 2018-04-01 LAB — TROPONIN I: Troponin I: <0.03 ng/mL (ref <0.03)

## 2018-04-01 IMAGING — CR CHEST - 2 VIEW
1 series · 2 of 2 positions shown · non-contrast
Comparison: None.

CLINICAL DATA: Chest pain

EXAM:
CHEST - 2 VIEW

[Series 1: dg chest 2 view · 0.14mm/px · 2 of 2 slices shown]
[im 1/2]
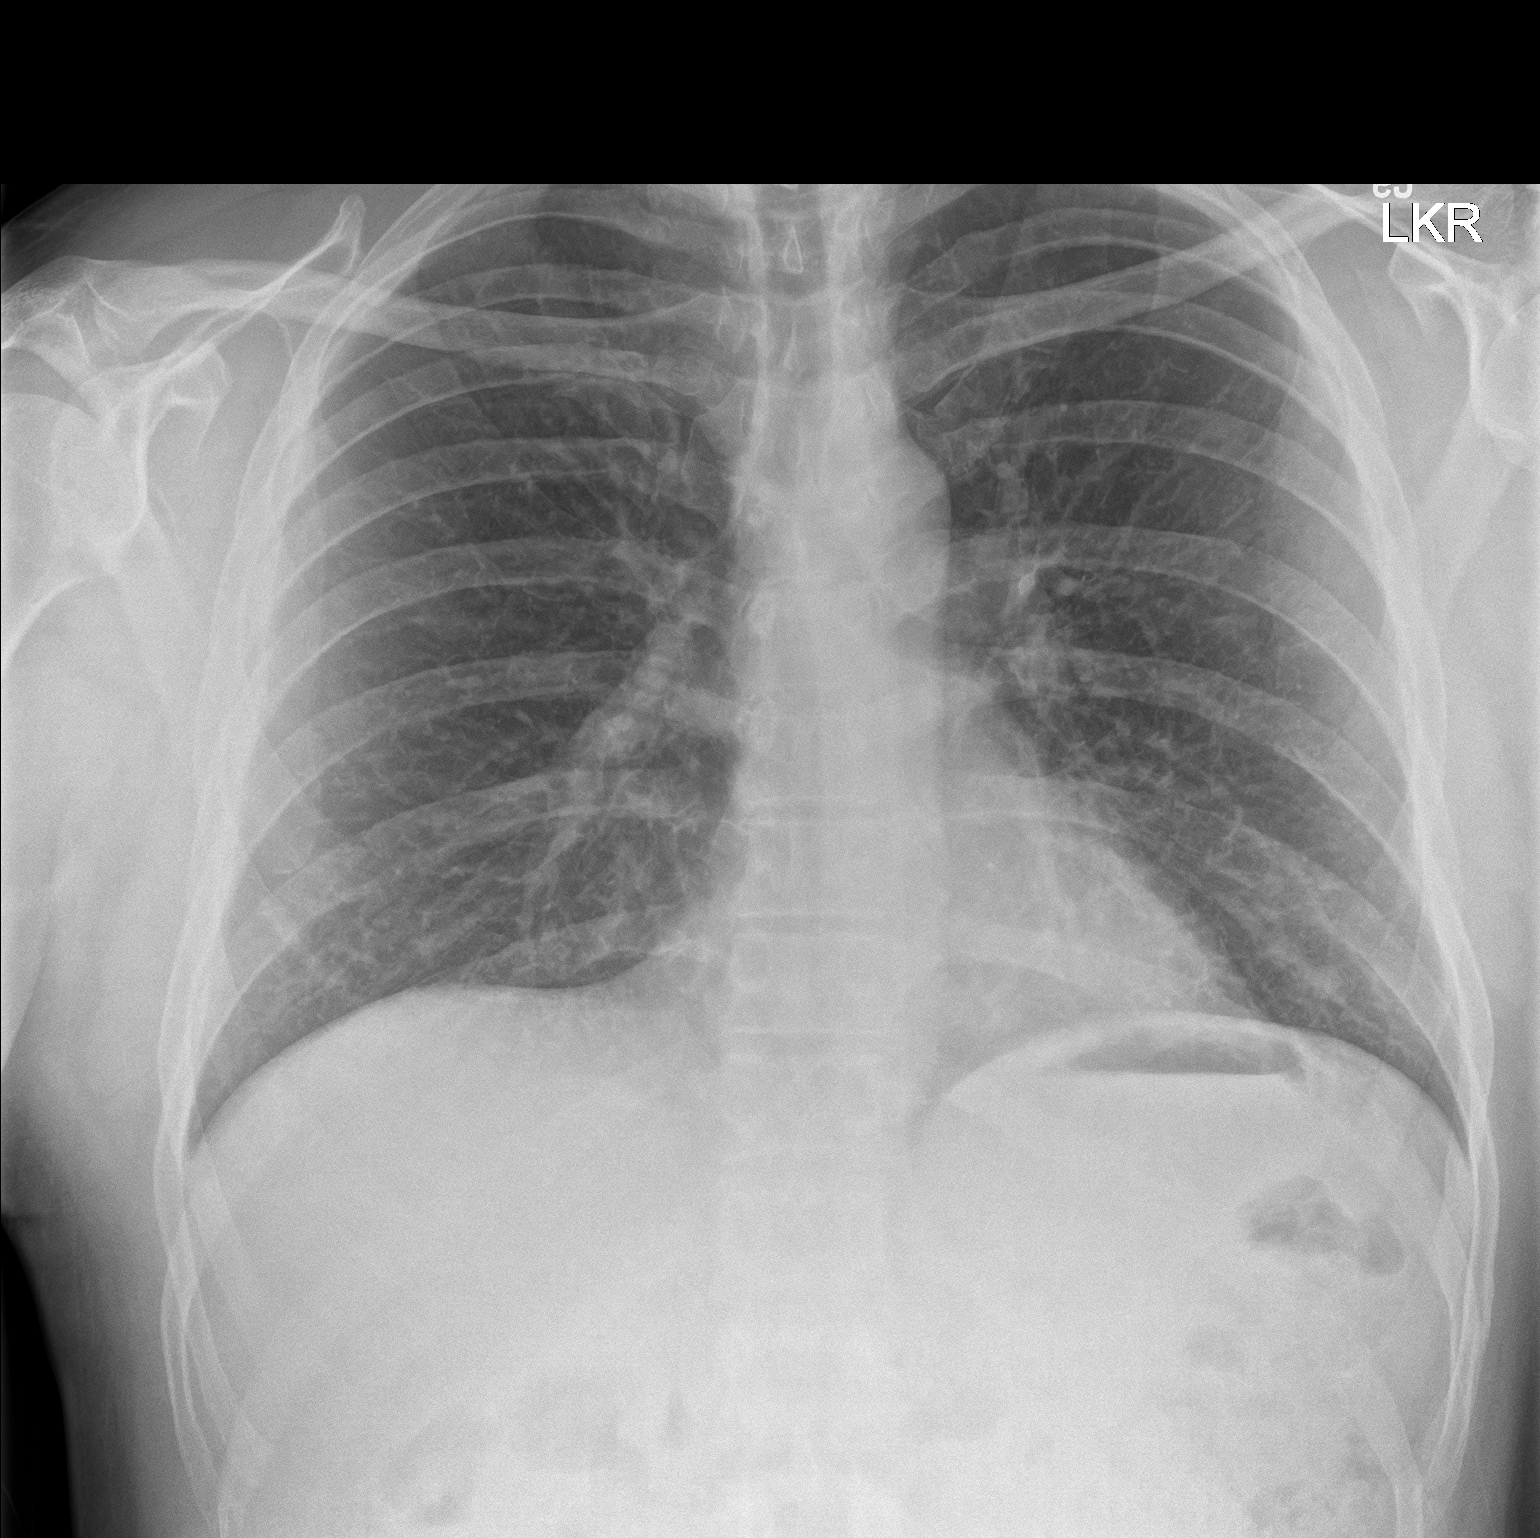
[im 2/2]
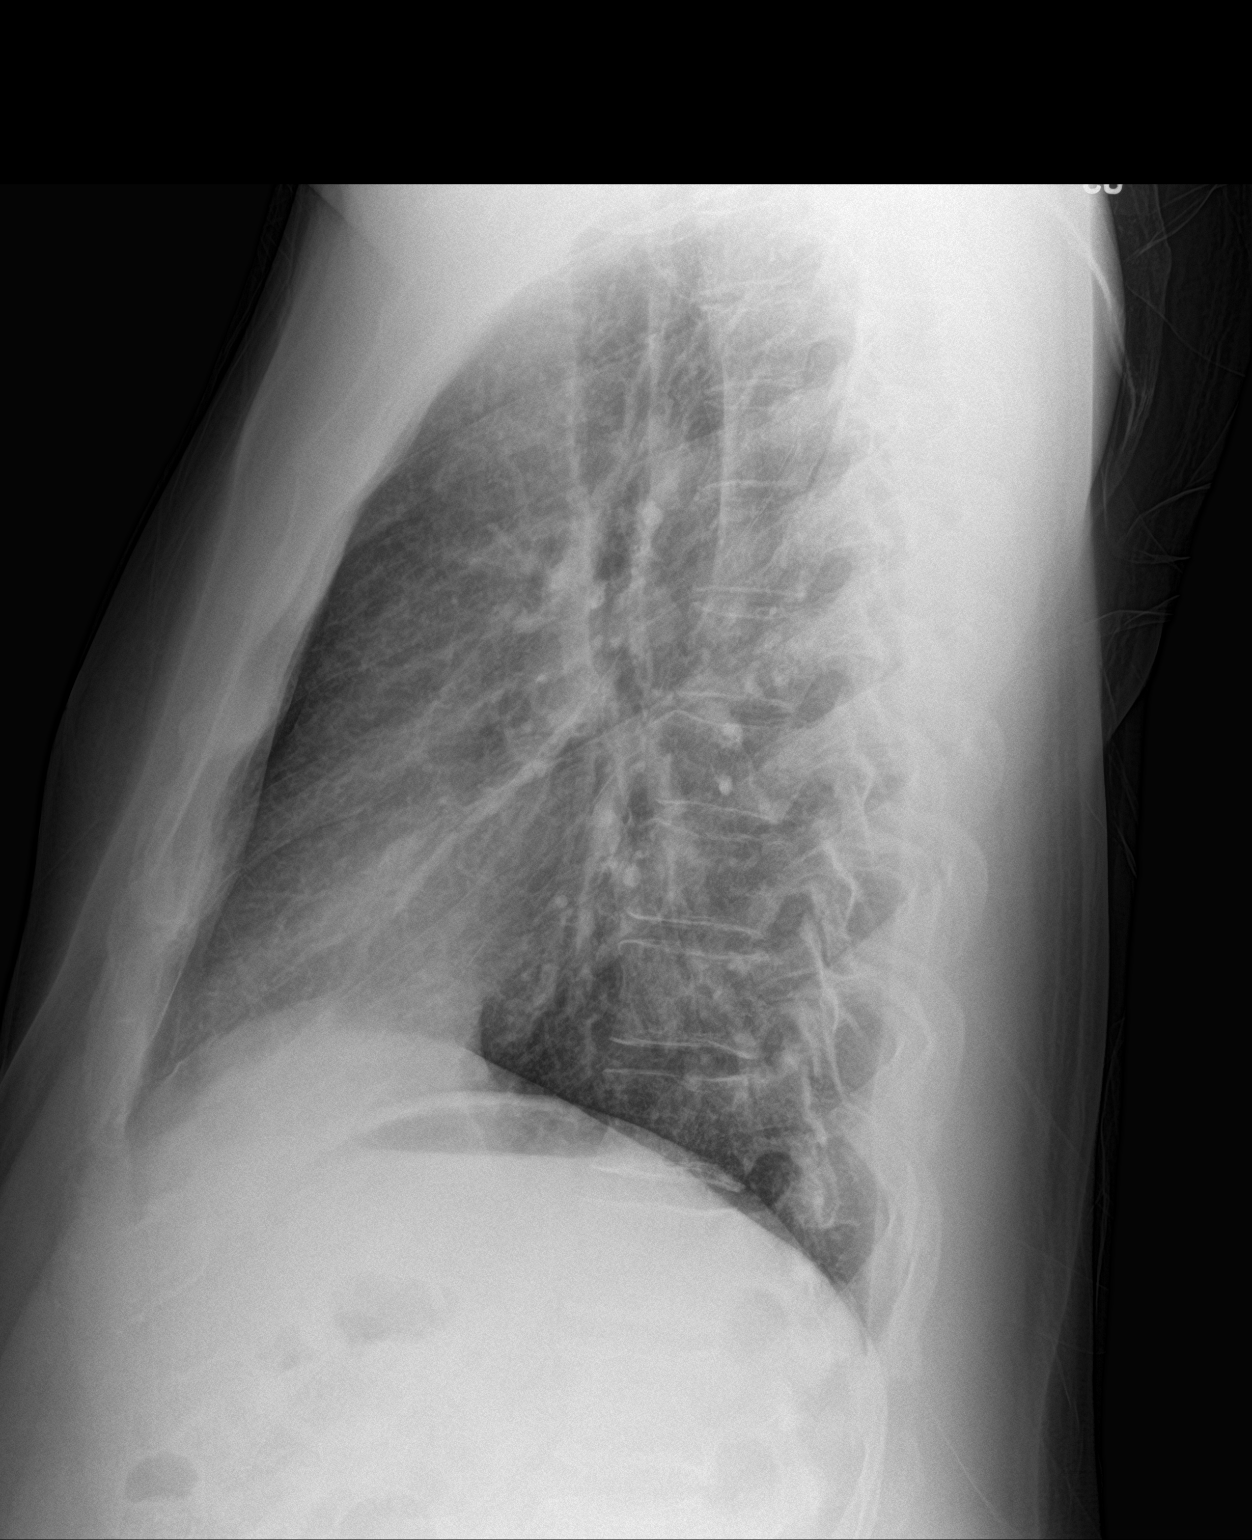

[2 of 2 positions shown; findings below may reference images not displayed]

FINDINGS: Lungs are clear. Heart size and pulmonary vascularity are normal. No
adenopathy. No pneumothorax. No bone lesions.
IMPRESSION: No edema or consolidation.

## 2018-04-01 MED ORDER — SODIUM CHLORIDE 0.9% FLUSH: 3.0000 mL | Freq: Once | INTRAVENOUS | Status: DC

## 2018-04-01 MED ORDER — SODIUM CHLORIDE 0.9 % IV SOLN
1000.0000 mL | Freq: Once | INTRAVENOUS | Status: AC
  Administered 2018-04-01: 1000 mL via INTRAVENOUS

## 2018-04-01 MED ORDER — ONDANSETRON HCL 4 MG/2ML IJ SOLN
4.0000 mg | Freq: Once | INTRAMUSCULAR | Status: AC
  Administered 2018-04-01: 4 mg via INTRAVENOUS
  Filled 2018-04-01: qty 2

## 2018-04-01 MED ORDER — KETOROLAC TROMETHAMINE 30 MG/ML IJ SOLN
30.0000 mg | Freq: Once | INTRAMUSCULAR | Status: AC
  Administered 2018-04-01: 30 mg via INTRAVENOUS
  Filled 2018-04-01: qty 1

## 2018-04-01 NOTE — ED Triage Notes (Signed)
Pt to ED via POV c/o chest pain x 1 hour and feeling like his blood sugar is high. Pt states that the pain is located in the upper right side of his chest. Pt denies radiation of pain. Pt denies N/V. Pt states that he is short of breath, pt is able to speak in complete sentences at this time. Pt is in NAD.

## 2018-04-01 NOTE — ED Provider Notes (Signed)
[facility] Emergency Department Provider Note   ____________________________________________    I have reviewed the triage vital signs and the nursing notes.   HISTORY  Chief Complaint Chest Pain     HPI Ronald Banks is a 43 y.o. male who presents with multiple complaints.  Patient describes chills, body aches, mild cough, mild nausea as well as some right-sided chest discomfort which apparently i occurs whenever his glucose gets elevated.  Denies pleurisy.  No recent travel.  No calf pain or swelling.  Has not take anything for the symptoms.  No sick contacts reported.  Did not get a flu shot this year.  Did get a pneumonia shot  Past Medical History:  Diagnosis Date  . Diabetes mellitus without complication ([place])    type 1     Patient Active Problem List   Diagnosis Date Noted  . Diabetic peripheral neuropathy associated with type 1 diabetes mellitus ([place]) 02/11/2018  . Hyperlipidemia LDL goal <70 02/11/2018  . Hypothyroidism 02/11/2018  . Chronic right shoulder pain 02/11/2018  . Chronic midline low back pain with right-sided sciatica 02/11/2018  . Anxiety 02/11/2018    Past Surgical History:  Procedure Laterality Date  . NO PAST SURGERIES      Prior to Admission medications   Medication Sig Start Date End Date Taking? Authorizing Provider  ACCU-CHEK FASTCLIX LANCETS MISC Use to check blood sugar up to 5 times daily. 03/05/18   Fulp, Cammie, MD  atorvastatin (LIPITOR) 40 MG tablet Take 1 tablet (40 mg total) by mouth at bedtime. 03/07/18   Fulp, Cammie, MD  baclofen (LIORESAL) 10 MG tablet Take 1 tablet (10 mg total) by mouth 3 (three) times daily as needed for muscle spasms. 02/19/18   Hilts, Legrand Como, MD  Blood Glucose Monitoring Suppl (ACCU-CHEK GUIDE) w/Device KIT 1 kit by Does not apply route 5 (five) times daily. 03/05/18   Fulp, Cammie, MD  DULoxetine (CYMBALTA) 60 MG capsule Take 1 capsule (60 mg total) by mouth daily. 02/11/18   Fulp,  Cammie, MD  gabapentin (NEURONTIN) 300 MG capsule Take 1 capsule (300 mg total) by mouth at bedtime. To help with pain in feet/hands; may increase to twice daily after 7 days 02/11/18   Fulp, Cammie, MD  glucose blood (ACCU-CHEK GUIDE) test strip Use as instructed to check blood sugar up to 5 times daily. 03/05/18   Fulp, Cammie, MD  ibuprofen (ADVIL,MOTRIN) 600 MG tablet Take 600 mg by mouth every 6 (six) hours as needed.    [provider]  insulin NPH-regular Human (70-30) 100 UNIT/ML injection 47 units in the morning and 40 units before evening meal 03/04/18   Fulp, Cammie, MD  levothyroxine (SYNTHROID, LEVOTHROID) 175 MCG tablet Take 1 tablet (175 mcg total) by mouth daily before breakfast. 02/11/18   Fulp, Cammie, MD  lisinopril (PRINIVIL,ZESTRIL) 5 MG tablet Take 1 tablet (5 mg total) by mouth daily. 1/2 TABLET daily 02/11/18   Fulp, Cammie, MD  methocarbamol (ROBAXIN) 500 MG tablet Take 2 tablets (1,000 mg total) by mouth 3 (three) times daily. As needed for muscle spasm 02/11/18   Fulp, Cammie, MD  venlafaxine XR (EFFEXOR-XR) 75 MG 24 hr capsule Take 1 capsule (75 mg total) by mouth daily with breakfast. 02/11/18   [name], MD     Allergies Patient has no known allergies.  Family History  Problem Relation Age of Onset  . Diabetes Mother   . Cancer Mother   . Diabetes Sister   .  Diabetes Maternal Grandmother     Social History Social History   Tobacco Use  . Smoking status: Current Every Day Smoker    Packs/day: 0.50    Types: Cigarettes  . Smokeless tobacco: Never Used  Substance Use Topics  . Alcohol use: Not Currently  . Drug use: Yes    Types: Marijuana    Comment: Several times per week    Review of Systems  Constitutional: Chills Eyes: No visual changes.  ENT: No sore throat. Cardiovascular: Right-sided aching chest discomfort, mild Respiratory: Denies shortness of breath. Gastrointestinal: As above Genitourinary: Negative for  dysuria. Musculoskeletal: Myalgias Skin: Negative for rash. Neurological: Negative for headaches    ____________________________________________   PHYSICAL EXAM:  VITAL SIGNS: ED Triage Vitals  Enc Vitals Group     BP 04/01/18 1326 103/64     Pulse Rate 04/01/18 1326 (!) 103     Resp 04/01/18 1326 16     Temp 04/01/18 1326 98 F (36.7 C)     Temp Source 04/01/18 1326 Oral     SpO2 04/01/18 1326 100 %     Weight 04/01/18 1324 102.1 kg (225 lb)     Height 04/01/18 1324 1.778 m (5' 10")     Head Circumference --      Peak Flow --      Pain Score 04/01/18 1324 7     Pain Loc --      Pain Edu? --      Excl. in [place]? --     Constitutional: Alert and oriented.  Eyes: Conjunctivae are normal.   Nose: No congestion/rhinnorhea. Mouth/Throat: Mucous membranes are moist.    Cardiovascular: Normal rate, regular rhythm. Grossly normal heart sounds.  Good peripheral circulation. Respiratory: Normal respiratory effort.  No retractions. Lungs CTAB. Gastrointestinal: Soft and nontender. No distention.  No CVA tenderness.  Musculoskeletal: No lower extremity tenderness nor edema.  Warm and well perfused Neurologic:  Normal speech and language. No gross focal neurologic deficits are appreciated.  Skin:  Skin is warm, dry and intact. No rash noted. Psychiatric: Mood and affect are normal. Speech and behavior are normal.  ____________________________________________   LABS (all labs ordered are listed, but only abnormal results are displayed)  Labs Reviewed  BASIC METABOLIC PANEL - Abnormal; Notable for the following components:      Result Value   Sodium 132 (*)    Glucose, Bld 277 (*)    Calcium 8.7 (*)    All other components within normal limits  CBC - Abnormal; Notable for the following components:   WBC 16.7 (*)    All other components within normal limits  GLUCOSE, CAPILLARY - Abnormal; Notable for the following components:   Glucose-Capillary 287 (*)    All other  components within normal limits  TROPONIN I   ____________________________________________  EKG  ED ECG REPORT I, Lavonia Drafts, the attending physician, personally viewed and interpreted this ECG.  Date: 04/01/2018  Rhythm: normal sinus rhythm QRS Axis: normal Intervals: normal ST/T Wave abnormalities: normal Narrative Interpretation: no evidence of acute ischemia  ____________________________________________  RADIOLOGY  X-ray normal ____________________________________________   PROCEDURES  Procedure(s) performed: No  Procedures   Critical Care performed: No ____________________________________________   INITIAL IMPRESSION / ASSESSMENT AND PLAN / ED COURSE  Pertinent labs & imaging results that were available during my care of the patient were reviewed by me and considered in my medical decision making (see chart for details).  Overall well-appearing in no acute distress.  Symptoms most  consistent with likely viral illness causing his chills myalgias fatigue.  Glucose is minimally elevated, normal anion gap.  Patient treated with IV fluids, IV Zofran and IV Toradol with significant symptom improvement  Lab work overall is reassuring, normal troponin, kidney function normal, nonspecific elevation in white blood cell count likely related to viral illness.  After treatment the patient reported that he wanted to go home and get in bed to rest which seems appropriate, we discussed return precautions   ____________________________________________   FINAL CLINICAL IMPRESSION(S) / ED DIAGNOSES  Final diagnoses:  Atypical chest pain  Viral syndrome        Note:  This document was prepared using Dragon voice recognition software and may include unintentional dictation errors.   Lavonia Drafts, MD 04/01/18 2135

## 2018-04-03 ENCOUNTER — Ambulatory Visit: Payer: Medicaid Other | Attending: Family Medicine

## 2018-04-05 ENCOUNTER — Ambulatory Visit: Payer: Medicaid Other | Admitting: Physical Therapy

## 2018-04-05 ENCOUNTER — Encounter: Payer: Self-pay | Admitting: Physical Therapy

## 2018-04-05 ENCOUNTER — Telehealth: Payer: Self-pay | Admitting: Physical Therapy

## 2018-04-05 NOTE — Telephone Encounter (Signed)
Spoke with patient regarding missed visit. Patient reports his shoulder is better and no longer needs PT. He requested therapy D/C the rest of his visits.

## 2018-04-08 ENCOUNTER — Ambulatory Visit: Payer: Medicaid Other | Attending: Family Medicine | Admitting: Family Medicine

## 2018-04-08 ENCOUNTER — Other Ambulatory Visit: Payer: Self-pay

## 2018-04-08 ENCOUNTER — Encounter: Payer: Self-pay | Admitting: Family Medicine

## 2018-04-08 VITALS — BP 122/68 | HR 57 | Temp 98.6°F | Resp 18 | Ht 70.0 in | Wt 219.0 lb

## 2018-04-08 DIAGNOSIS — E871 Hypo-osmolality and hyponatremia: Secondary | ICD-10-CM

## 2018-04-08 DIAGNOSIS — G8929 Other chronic pain: Secondary | ICD-10-CM

## 2018-04-08 DIAGNOSIS — M25511 Pain in right shoulder: Secondary | ICD-10-CM

## 2018-04-08 DIAGNOSIS — R0789 Other chest pain: Secondary | ICD-10-CM

## 2018-04-08 DIAGNOSIS — E109 Type 1 diabetes mellitus without complications: Secondary | ICD-10-CM | POA: Diagnosis not present

## 2018-04-08 DIAGNOSIS — M6530 Trigger finger, unspecified finger: Secondary | ICD-10-CM

## 2018-04-08 DIAGNOSIS — D72829 Elevated white blood cell count, unspecified: Secondary | ICD-10-CM

## 2018-04-08 LAB — GLUCOSE, POCT (MANUAL RESULT ENTRY): POC Glucose: 215 mg/dL — AB (ref 70–99)

## 2018-04-08 NOTE — Progress Notes (Signed)
Established Patient Office Visit  Subjective:  Patient ID: Ronald Banks, male    DOB: 11/27/1975  Age: 43 y.o. MRN: [id number]  CC:  Chief Complaint  Patient presents with  . Follow-up    HPI Ronald Banks presents for ED follow-up of atypical chest pain on 04/01/2018.  Patient reports that on the day of his emergency department visit he had acute onset of sharp pain in his right upper chest.  Patient denies any shortness of breath or cough.  Patient does have history of smoking approximately half pack per day of cigarettes since the age of 61 or 87.  Patient reports that he did have chest x-ray done during his emergency department evaluation.  Patient this morning however had acute onset of pain in his left upper chest.  Patient states that the pain was brief lasting less than a minute.  Patient does not believe that changes in position of his arms increases pain but patient does have difficulty with range of motion in the right shoulder especially if he attempts to reach behind his back.  Patient reports that he believes he did see a specialist about this in the past and had physical therapy however he continues to have chronic pain in his right shoulder and difficulty with range of motion of his right shoulder/arm.  Patient also reports that a few months ago he fell off of a ladder.  Patient is not sure if he had any significant injuries as he got back up quickly after falling.      Patient also with complaint of numbness in his middle and ring finger of the left hand as well as decreased ability to bend these fingers.  Patient states that the colder he gets, the more difficulty has been doing these 2 fingers.  Patient has had past treatment/steroid injection into the palm of the left hand as he would have issues in the past with the middle and index finger becoming stuck in a downward position and patient also had a nodule in the palm of his left hand.        Patient reports his blood sugars  continue to be up and down.  Blood sugars are generally in the mid 100s - 200s.  Patient states that he would like to have an insulin pump.  Patient received a phone call regarding an appointment in [place] to see someone about an insulin pump however he lost this information.  Patient states that he lives close to the [place] and as his mother provides transportation for him, he would like to see a specialist closer to the [place] area if possible.  Past Medical History:  Diagnosis Date  . Diabetes mellitus without complication ([place])    type 1     Past Surgical History:  Procedure Laterality Date  . NO PAST SURGERIES      Family History  Problem Relation Age of Onset  . Diabetes Mother   . Cancer Mother   . Diabetes Sister   . Diabetes Maternal Grandmother     Social History   Tobacco Use  . Smoking status: Current Every Day Smoker    Packs/day: 0.50    Types: Cigarettes  . Smokeless tobacco: Never Used  Substance Use Topics  . Alcohol use: Not Currently  . Drug use: Yes    Types: Marijuana    Comment: Several times per week    Outpatient Medications Prior to Visit  Medication Sig Dispense Refill  . ACCU-CHEK FASTCLIX LANCETS MISC  Use to check blood sugar up to 5 times daily. 102 each 11  . atorvastatin (LIPITOR) 40 MG tablet Take 1 tablet (40 mg total) by mouth at bedtime. 90 tablet 1  . baclofen (LIORESAL) 10 MG tablet Take 1 tablet (10 mg total) by mouth 3 (three) times daily as needed for muscle spasms. 90 tablet 1  . Blood Glucose Monitoring Suppl (ACCU-CHEK GUIDE) w/Device KIT 1 kit by Does not apply route 5 (five) times daily. 1 kit 0  . DULoxetine (CYMBALTA) 60 MG capsule Take 1 capsule (60 mg total) by mouth daily. 30 capsule 1  . gabapentin (NEURONTIN) 300 MG capsule Take 1 capsule (300 mg total) by mouth at bedtime. To help with pain in feet/hands; may increase to twice daily after 7 days 60 capsule 3  . glucose blood (ACCU-CHEK GUIDE) test strip Use  as instructed to check blood sugar up to 5 times daily. 100 each 11  . ibuprofen (ADVIL,MOTRIN) 600 MG tablet Take 600 mg by mouth every 6 (six) hours as needed.    . insulin NPH-regular Human (70-30) 100 UNIT/ML injection 47 units in the morning and 40 units before evening meal 10 mL 4  . levothyroxine (SYNTHROID, LEVOTHROID) 175 MCG tablet Take 1 tablet (175 mcg total) by mouth daily before breakfast. 30 tablet 5  . lisinopril (PRINIVIL,ZESTRIL) 5 MG tablet Take 1 tablet (5 mg total) by mouth daily. 1/2 TABLET daily 45 tablet 3  . methocarbamol (ROBAXIN) 500 MG tablet Take 2 tablets (1,000 mg total) by mouth 3 (three) times daily. As needed for muscle spasm 90 tablet 0  . venlafaxine XR (EFFEXOR-XR) 75 MG 24 hr capsule Take 1 capsule (75 mg total) by mouth daily with breakfast. 30 capsule 1   No facility-administered medications prior to visit.     No Known Allergies  ROS Review of Systems  Constitutional: Negative for chills, fatigue and fever.  HENT: Negative for sore throat and trouble swallowing.   Eyes: Negative for photophobia and visual disturbance.  Respiratory: Negative for cough and shortness of breath.   Cardiovascular: Positive for chest pain. Negative for palpitations and leg swelling.  Gastrointestinal: Negative for abdominal pain, constipation, diarrhea and nausea.  Endocrine: Positive for polydipsia (when blood sugar is high) and polyuria (when glucose is high). Negative for polyphagia.  Genitourinary: Negative for dysuria and flank pain.  Musculoskeletal: Positive for arthralgias and myalgias. Negative for neck pain and neck stiffness.  Neurological: Negative for dizziness and headaches.  Hematological: Negative for adenopathy. Does not bruise/bleed easily.      Objective:    Physical Exam  BP 122/68 (BP Location: Left Arm, Patient Position: Sitting, Cuff Size: Normal)   Pulse (!) 57   Temp 98.6 F (37 C) (Oral)   Resp 18   Ht _0  (1.778 m)   Wt 219 lb  (99.3 kg)   SpO2 99%   BMI 31.42 kg/m Nurse's notes and vital signs reviewed General-well-nourished, well-developed overweight for height male in no acute distress Neck-supple, no lymphadenopathy, no thyromegaly Lungs-clear to auscultation bilaterally, breathing is nonlabored Cardiovascular-regular rate and regular rhythm Abdomen-mild truncal obesity, soft and nontender Back- no CVA tenderness, patient with mild lumbosacral discomfort to palpation and mild lumbar paraspinous spasm Musculoskeletal- patient with decreased ability to lift the right arm above horizontal and patient with decreased range of motion when attempting to reach the right arm behind his back.  Patient with discomfort to palpation at the right lateral posterior shoulder.  Patient with some contracture  of the tendons in the left palm.  Patient is able to flex and extend all fingers but does have trouble flexing and extending the middle and index fingers as quickly as the other fingers on the left hand Extremities-no edema Psych-normal mood and judgment  Wt Readings from Last 3 Encounters:  04/08/18 219 lb (99.3 kg)  04/01/18 225 lb (102.1 kg)  02/11/18 228 lb (103.4 kg)     Health Maintenance Due  Topic Date Due  . FOOT EXAM  09/20/1983  . OPHTHALMOLOGY EXAM  09/20/1983  . HIV Screening  09/19/1988  . INFLUENZA VACCINE  08/23/2017      Lab Results  Component Value Date   TSH 124.600 (H) 02/11/2018   Lab Results  Component Value Date   WBC 16.7 (H) 04/01/2018   HGB 16.5 04/01/2018   HCT 49.0 04/01/2018   MCV 93.9 04/01/2018   PLT 265 04/01/2018   Lab Results  Component Value Date   NA 132 (L) 04/01/2018   K 4.4 04/01/2018   CO2 25 04/01/2018   GLUCOSE 277 (H) 04/01/2018   BUN 13 04/01/2018   CREATININE 1.23 04/01/2018   BILITOT 0.5 02/11/2018   ALKPHOS 55 02/11/2018   AST 31 02/11/2018   ALT 17 02/11/2018   PROT 6.6 02/11/2018   ALBUMIN 4.2 02/11/2018   CALCIUM 8.7 (L) 04/01/2018   ANIONGAP  9 04/01/2018   Lab Results  Component Value Date   CHOL 298 (H) 03/04/2018   Lab Results  Component Value Date   HDL 59 03/04/2018   Lab Results  Component Value Date   LDLCALC 214 (H) 03/04/2018   Lab Results  Component Value Date   TRIG 124 03/04/2018   Lab Results  Component Value Date   CHOLHDL 5.1 (H) 03/04/2018   Lab Results  Component Value Date   HGBA1C 9.6 (H) 02/11/2018      Assessment & Plan:  1. Type 1 diabetes mellitus without complication ([place]) Patient reports that his blood sugars remain up and down.  Patient has been referred to endocrinology as patient is interested in having an insulin pump.  Patient will have BMP at today's visit in follow-up of his blood sugar and electrolytes.  Patient will follow-up next month for hemoglobin A1c, diabetic foot exam, referral for diabetic eye exam unless any of these have already been performed by endocrinology. - Glucose (CBG) - Basic Metabolic Panel - Ambulatory referral to Endocrinology  2. Leukocytosis, unspecified type We will repeat CBC as patient had leukocytosis with white blood cell count of 16.7 at recent emergency department visit on 04/01/2018. - CBC with Differential  3. Atypical chest pain Patient with atypical chest pain.  I discussed with the patient that I suspect the chest pain is musculoskeletal based on the location.  Patient did have work-up for chest pain and emergency department visit on 04/01/2018 in [place] at [facility].  Patient with negative troponin and normal EKG.  Chest x-ray was also done and negative.  Patient did have leukocytosis with white blood cell count of 16.7 and patient was told that he may have a viral illness as he also reported some chills, body aches and fatigue at that time patient was given IV fluids, IV Zofran and IV Toradol with significant symptom relief.  Patient also with glucose of 287 at his ED visit.  Patient reports that he did feel better after the  emergency department visit but was concerned today after brief episode of left-sided chest pain.  4. Hyponatremia On review of past blood work, patient with sodium level of 132 during his recent emergency department visit and BMP will be repeated.  Patient denies any dizziness or loss of balance at today's visit. - Basic Metabolic Panel  5. Chronic right shoulder pain Patient with chronic right shoulder pain with decreased range of motion.  Patient will be referred to orthopedics for further evaluation and treatment - AMB referral to orthopedics  6. Trigger finger of left hand, unspecified finger Patient with a trigger finger of the left hand and palmar contraction.  Patient will be referred to orthopedic/hand specialist - Ambulatory referral to Orthopedic Surgery  An After Visit Summary was printed and given to the patient.  Allergies as of 04/08/2018   No Known Allergies     Medication List       Accurate as of April 08, 2018  5:41 PM. Always use your most recent med list.        Accu-Chek FastClix Lancets Misc Use to check blood sugar up to 5 times daily.   Accu-Chek Guide w/Device Kit 1 kit by Does not apply route 5 (five) times daily.   atorvastatin 40 MG tablet Commonly known as:  LIPITOR Take 1 tablet (40 mg total) by mouth at bedtime.   baclofen 10 MG tablet Commonly known as:  LIORESAL Take 1 tablet (10 mg total) by mouth 3 (three) times daily as needed for muscle spasms.   DULoxetine 60 MG capsule Commonly known as:  CYMBALTA Take 1 capsule (60 mg total) by mouth daily.   gabapentin 300 MG capsule Commonly known as:  NEURONTIN Take 1 capsule (300 mg total) by mouth at bedtime. To help with pain in feet/hands; may increase to twice daily after 7 days   glucose blood test strip Commonly known as:  Accu-Chek Guide Use as instructed to check blood sugar up to 5 times daily.   ibuprofen 600 MG tablet Commonly known as:  ADVIL,MOTRIN Take 600 mg by mouth  every 6 (six) hours as needed.   insulin NPH-regular Human (70-30) 100 UNIT/ML injection 47 units in the morning and 40 units before evening meal   levothyroxine 175 MCG tablet Commonly known as:  SYNTHROID, LEVOTHROID Take 1 tablet (175 mcg total) by mouth daily before breakfast.   lisinopril 5 MG tablet Commonly known as:  PRINIVIL,ZESTRIL Take 1 tablet (5 mg total) by mouth daily. 1/2 TABLET daily   methocarbamol 500 MG tablet Commonly known as:  ROBAXIN Take 2 tablets (1,000 mg total) by mouth 3 (three) times daily. As needed for muscle spasm   venlafaxine XR 75 MG 24 hr capsule Commonly known as:  EFFEXOR-XR Take 1 capsule (75 mg total) by mouth daily with breakfast.        Follow-up: Return in about 4 weeks (around 05/06/2018) for labs/follow-up DM/thyroid/chest pain.   [name], MD

## 2018-04-09 LAB — CBC WITH DIFFERENTIAL/PLATELET
Basophils Absolute: 0.1 x10E3/uL (ref 0.0–0.2)
Basos: 1 %
EOS (ABSOLUTE): 0.1 x10E3/uL (ref 0.0–0.4)
Eos: 1 %
Hematocrit: 45.5 % (ref 37.5–51.0)
Hemoglobin: 15.4 g/dL (ref 13.0–17.7)
Immature Grans (Abs): 0.1 x10E3/uL (ref 0.0–0.1)
Immature Granulocytes: 1 %
Lymphocytes Absolute: 3.7 x10E3/uL — ABNORMAL HIGH (ref 0.7–3.1)
Lymphs: 27 %
MCH: 32 pg (ref 26.6–33.0)
MCHC: 33.8 g/dL (ref 31.5–35.7)
MCV: 94 fL (ref 79–97)
Monocytes Absolute: 0.4 x10E3/uL (ref 0.1–0.9)
Monocytes: 3 %
Neutrophils Absolute: 9.1 x10E3/uL — ABNORMAL HIGH (ref 1.4–7.0)
Neutrophils: 67 %
Platelets: 356 x10E3/uL (ref 150–450)
RBC: 4.82 x10E6/uL (ref 4.14–5.80)
RDW: 13 % (ref 11.6–15.4)
WBC: 13.6 x10E3/uL — ABNORMAL HIGH (ref 3.4–10.8)

## 2018-04-09 LAB — BASIC METABOLIC PANEL WITH GFR
BUN/Creatinine Ratio: 4 — ABNORMAL LOW (ref 9–20)
BUN: 6 mg/dL (ref 6–24)
CO2: 23 mmol/L (ref 20–29)
Calcium: 8.9 mg/dL (ref 8.7–10.2)
Chloride: 96 mmol/L (ref 96–106)
Creatinine, Ser: 1.38 mg/dL — ABNORMAL HIGH (ref 0.76–1.27)
GFR calc Af Amer: 71 mL/min/1.73
GFR calc non Af Amer: 62 mL/min/1.73
Glucose: 218 mg/dL — ABNORMAL HIGH (ref 65–99)
Potassium: 4.3 mmol/L (ref 3.5–5.2)
Sodium: 135 mmol/L (ref 134–144)

## 2018-04-10 ENCOUNTER — Telehealth: Payer: Self-pay | Admitting: *Deleted

## 2018-04-10 NOTE — Telephone Encounter (Signed)
On hold just means the RX was sent to the pharmacy and it was placed on hold on the patients profile. This is usually done because the prescription it is too soon to fill the prescription. They should call the pharmacy when they are ready for the refill.

## 2018-04-10 NOTE — Telephone Encounter (Signed)
He called.... he only has a half a bottle. I just got off the phone with him. He was here yesterday to be seen and went to pick them up.Marland Kitchen He needs to be advised on if he can come and get them today.

## 2018-04-10 NOTE — Telephone Encounter (Signed)
Patient verified DOB Patient is aware of WBC being improved but still elevated and creatinine also being elevated and needing to remain hydrated. Patient states the pharmacy still has his strips and insulin on hold. MA will reach out to Pharm tech and inquire.

## 2018-04-12 ENCOUNTER — Encounter: Payer: Medicaid Other | Admitting: Physical Therapy

## 2018-04-12 NOTE — Telephone Encounter (Signed)
The insulin an test strips

## 2018-04-16 ENCOUNTER — Telehealth: Payer: Self-pay | Admitting: Family Medicine

## 2018-04-16 DIAGNOSIS — E109 Type 1 diabetes mellitus without complications: Secondary | ICD-10-CM

## 2018-04-16 NOTE — Telephone Encounter (Signed)
Patient called to let us know that Medicaid don't cover  insulin NPH-regular Human (70-30) 100 UNIT/ML injection . Thank You

## 2018-04-17 ENCOUNTER — Telehealth (INDEPENDENT_AMBULATORY_CARE_PROVIDER_SITE_OTHER): Payer: Self-pay | Admitting: Radiology

## 2018-04-17 ENCOUNTER — Telehealth (INDEPENDENT_AMBULATORY_CARE_PROVIDER_SITE_OTHER): Payer: Self-pay | Admitting: Orthopedic Surgery

## 2018-04-17 MED ORDER — INSULIN NPH ISOPHANE & REGULAR (70-30) 100 UNIT/ML ~~LOC~~ SUSP: SUBCUTANEOUS | 4 refills | Status: DC

## 2018-04-17 NOTE — Telephone Encounter (Signed)
RX resent for Medicaid preferred product, pharmacy was given my direct line to call with any further issues.

## 2018-04-17 NOTE — Telephone Encounter (Signed)
Patient returned call and I did Screening Questions and ALL answers NO per pt. Adv of time of appt

## 2018-04-17 NOTE — Telephone Encounter (Signed)
Unable to contact to ask screening questions. Cell phone no longer available. Home phone no voicemail set up, only rings.  Do you have now or have you had in the past 7 days a fever and/or chills? Do you have now or have you had in the past 7 days a cough? Do you have now or have you had in the last 7 days nausea, vomiting or abdominal pain? Have you been exposed to anyone who has tested positive for COVID-19? Have you or anyone who lives with you traveled within the last month?

## 2018-04-18 ENCOUNTER — Telehealth (INDEPENDENT_AMBULATORY_CARE_PROVIDER_SITE_OTHER): Payer: Self-pay | Admitting: *Deleted

## 2018-04-18 ENCOUNTER — Other Ambulatory Visit: Payer: Self-pay

## 2018-04-18 ENCOUNTER — Ambulatory Visit: Payer: Medicaid Other

## 2018-04-18 ENCOUNTER — Other Ambulatory Visit: Payer: Self-pay | Admitting: Pharmacist

## 2018-04-18 ENCOUNTER — Encounter (INDEPENDENT_AMBULATORY_CARE_PROVIDER_SITE_OTHER): Payer: Self-pay | Admitting: Orthopedic Surgery

## 2018-04-18 ENCOUNTER — Ambulatory Visit (INDEPENDENT_AMBULATORY_CARE_PROVIDER_SITE_OTHER): Payer: Medicaid Other | Admitting: Orthopedic Surgery

## 2018-04-18 DIAGNOSIS — M25511 Pain in right shoulder: Secondary | ICD-10-CM

## 2018-04-18 DIAGNOSIS — I10 Essential (primary) hypertension: Secondary | ICD-10-CM | POA: Insufficient documentation

## 2018-04-18 DIAGNOSIS — E785 Hyperlipidemia, unspecified: Secondary | ICD-10-CM

## 2018-04-18 DIAGNOSIS — E1159 Type 2 diabetes mellitus with other circulatory complications: Secondary | ICD-10-CM | POA: Insufficient documentation

## 2018-04-18 DIAGNOSIS — E1042 Type 1 diabetes mellitus with diabetic polyneuropathy: Secondary | ICD-10-CM | POA: Insufficient documentation

## 2018-04-18 DIAGNOSIS — E039 Hypothyroidism, unspecified: Secondary | ICD-10-CM

## 2018-04-18 DIAGNOSIS — E1069 Type 1 diabetes mellitus with other specified complication: Secondary | ICD-10-CM | POA: Insufficient documentation

## 2018-04-18 DIAGNOSIS — F17210 Nicotine dependence, cigarettes, uncomplicated: Secondary | ICD-10-CM

## 2018-04-18 DIAGNOSIS — R202 Paresthesia of skin: Secondary | ICD-10-CM

## 2018-04-18 DIAGNOSIS — E1040 Type 1 diabetes mellitus with diabetic neuropathy, unspecified: Secondary | ICD-10-CM

## 2018-04-18 DIAGNOSIS — M7501 Adhesive capsulitis of right shoulder: Secondary | ICD-10-CM | POA: Diagnosis not present

## 2018-04-18 DIAGNOSIS — I152 Hypertension secondary to endocrine disorders: Secondary | ICD-10-CM

## 2018-04-18 HISTORY — DX: Type 1 diabetes mellitus with diabetic polyneuropathy: E10.42

## 2018-04-18 HISTORY — DX: Type 1 diabetes mellitus with other specified complication: E10.69

## 2018-04-18 HISTORY — DX: Hyperlipidemia, unspecified: E78.5

## 2018-04-18 HISTORY — DX: Hypertension secondary to endocrine disorders: I15.2

## 2018-04-18 HISTORY — DX: Type 2 diabetes mellitus with other circulatory complications: E11.59

## 2018-04-18 MED ORDER — "INSULIN SYRINGE 29G X 1/2"" 0.5 ML MISC": 11 refills | Status: DC

## 2018-04-18 MED ORDER — LIDOCAINE HCL 1 % IJ SOLN
5.0000 mL | INTRAMUSCULAR | Status: AC | PRN
  Administered 2018-04-18: 5 mL

## 2018-04-18 MED ORDER — BUPIVACAINE HCL 0.5 % IJ SOLN
9.0000 mL | INTRAMUSCULAR | Status: AC | PRN
  Administered 2018-04-18: 9 mL via INTRA_ARTICULAR

## 2018-04-18 NOTE — Telephone Encounter (Signed)
Lee with [facility] pharmacy called stating he just received a rx for tramadol but there was not signature or mg written on it and just needs clarification as to what is needed.   CB [phone]

## 2018-04-18 NOTE — Telephone Encounter (Signed)
IC advised ultram 75m

## 2018-04-18 NOTE — Progress Notes (Signed)
Office Visit Note   Patient: Ronald Banks           Date of Birth: Apr 22, 1975           MRN: [id number] Visit Date: 04/18/2018 Requested by: [name], MD [place], [place] [address] PCP: [name], MD  Subjective: Chief Complaint  Patient presents with  . Right Shoulder - Pain    HPI: Ronald Banks is a 43 year old patient with right shoulder pain.  Is been going on for several months but worse over the past month.  He describes a history remotely of shoulder dislocations but currently he does not report any instability.  He reports primarily pain and stiffness in that right arm along with some mild numbness and tingling in the fingertips.  He does have diabetes which runs in the 200-250 blood glucose range.  He was seen in December.  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  No significant glenohumeral arthritis is present on his radiographs.  He does work with [employer].  Takes ibuprofen for symptoms.              ROS: All systems reviewed are negative as they relate to the chief complaint within the history of present illness.  Patient denies  fevers or chills.   Assessment & Plan: Visit Diagnoses:  1. Adhesive capsulitis of right shoulder     Plan: Impression is difference in passive range of motion right shoulder versus left consistent with early frozen shoulder.  Rotator cuff strength is intact and there is no real instability issues.  Plan is I would like to do a cortisone injection into the glenohumeral joint but because of his blood glucose I do not think that is practical.  We are going to try a Toradol injection along with Marcaine into the shoulder joint to see if that helps.  That will be performed and then will see him back in about 3 months to decide if we need to do any type of further intervention.  He does do daily home stretching.  Follow-Up Instructions: Return in about 3 months (around 07/19/2018).   Orders:  No  orders of the defined types were placed in this encounter.  No orders of the defined types were placed in this encounter.     Procedures: Large Joint Inj: R glenohumeral on 04/18/2018 11:31 AM Indications: diagnostic evaluation and pain Details: 18 G 1.5 in needle, posterior approach  Arthrogram: No  Medications: 9 mL bupivacaine 0.5 %; 5 mL lidocaine 1 % Outcome: tolerated well, no immediate complications Procedure, treatment alternatives, risks and benefits explained, specific risks discussed. Consent was given by the patient. Immediately prior to procedure a time out was called to verify the correct patient, procedure, equipment, support staff and site/side marked as required. Patient was prepped and draped in the usual sterile fashion.     1 cc of Toradol is injected into the shoulder along with the 9 cc of Marcaine.  Clinical Data: No additional findings.  Objective: Vital Signs: There were no vitals taken for this visit.  Physical Exam:   Constitutional: Patient appears well-developed HEENT:  Head: Normocephalic Eyes:EOM are normal Neck: Normal range of motion Cardiovascular: Normal rate Pulmonary/chest: Effort normal Neurologic: Patient is alert Skin: Skin is warm Psychiatric: Patient has normal mood and affect    Ortho Exam: Ortho exam demonstrates forward flexion on the left 180 on the right 165.  External rotation on the left at 15 degrees of abduction  is about 45 compared to 20 on the right.  Rotator cuff strength is excellent infraspinatus supraspinatus subscap muscle testing on the right left-hand side.  No coarse grinding or crepitus.  He has good shoulder strength.  No AC joint tenderness to direct palpation.  Motor sensory function to the hand is intact.  Does describe mild paresthesias on the fingertips.  This is on the right-hand side.  Specialty Comments:  No specialty comments available.  Imaging: No results found.   PMFS History: Patient Active  Problem List   Diagnosis Date Noted  . Diabetic peripheral neuropathy associated with type 1 diabetes mellitus ([place]) 02/11/2018  . Hyperlipidemia LDL goal <70 02/11/2018  . Hypothyroidism 02/11/2018  . Chronic right shoulder pain 02/11/2018  . Chronic midline low back pain with right-sided sciatica 02/11/2018  . Anxiety 02/11/2018   Past Medical History:  Diagnosis Date  . Diabetes mellitus without complication ([place])    type 1     Family History  Problem Relation Age of Onset  . Diabetes Mother   . Cancer Mother   . Diabetes Sister   . Diabetes Maternal Grandmother     Past Surgical History:  Procedure Laterality Date  . NO PAST SURGERIES     Social History   Occupational History  . Not on file  Tobacco Use  . Smoking status: Current Every Day Smoker    Packs/day: 0.50    Types: Cigarettes  . Smokeless tobacco: Never Used  Substance and Sexual Activity  . Alcohol use: Not Currently  . Drug use: Yes    Types: Marijuana    Comment: Several times per week  . Sexual activity: Yes

## 2018-05-10 ENCOUNTER — Encounter: Payer: Self-pay | Admitting: Family Medicine

## 2018-05-10 ENCOUNTER — Other Ambulatory Visit: Payer: Self-pay

## 2018-05-10 ENCOUNTER — Ambulatory Visit: Payer: Medicaid Other | Attending: Family Medicine | Admitting: Family Medicine

## 2018-05-10 DIAGNOSIS — E1069 Type 1 diabetes mellitus with other specified complication: Secondary | ICD-10-CM | POA: Diagnosis not present

## 2018-05-10 DIAGNOSIS — M25511 Pain in right shoulder: Secondary | ICD-10-CM | POA: Diagnosis not present

## 2018-05-10 DIAGNOSIS — G8929 Other chronic pain: Secondary | ICD-10-CM

## 2018-05-10 DIAGNOSIS — M5441 Lumbago with sciatica, right side: Secondary | ICD-10-CM

## 2018-05-10 DIAGNOSIS — E1042 Type 1 diabetes mellitus with diabetic polyneuropathy: Secondary | ICD-10-CM

## 2018-05-10 MED ORDER — INSULIN NPH ISOPHANE & REGULAR (70-30) 100 UNIT/ML ~~LOC~~ SUSP: SUBCUTANEOUS | 1 refills | Status: DC

## 2018-05-10 NOTE — Progress Notes (Signed)
DM Med refill  Per pt he just checked his sugar and it was 48. Per pt his sugar check for yesterday was   16th AM 188         7:38pm 297          9:30 pm 254    17th 8:59am 307          11:23am246           2:56pm(have not eaten): 48 (patient just drank a Mtn Dew at 3:03pm today while staff on the phone)  Blood sugar at 3:39pm today (04/09/18) is 139  Back (center) and shoulder(rt) pain   Per pt the doctor that he see for his diabetes is wanting to change the Humulin but have not done so

## 2018-05-10 NOTE — Progress Notes (Signed)
Virtual Visit via Telephone Note  I connected with Ronald Banks on 05/10/18 at  3:10 PM EDT by telephone and verified that I am speaking with the correct person using two identifiers.   I discussed the limitations, risks, security and privacy concerns of performing an evaluation and management service by telephone and the availability of in person appointments. I also discussed with the patient that there may be a patient responsible charge related to this service. The patient expressed understanding and agreed to proceed.   History of Present Illness:       43 yo male with type 1 Diabetes with peripheral neuropathy with most recent Hgb A1c of 9.6 on 02/11/2018. Patient reports that he has followed up with endocrinology and he thought that he was going to have a new type of insulin sent in but he has not heard anything from pharmacy and he is about to run out of his current 70/30 and needs a refill.  Patient continues to have issues with painful numbness in his feet but states that the use of gabapentin has helped.       Patient reports that he has seen orthopedics regarding his chronic right shoulder pain as well as chronic low back pain.  Gabapentin is helping with the pain that radiates down his right leg.  Patient reports some improvement in his shoulder pain after injection was done by orthopedics.  Patient continues to take ibuprofen as needed for back and shoulder pain.  He denies any issues with stomach upset and no blood in the stool or dark stools with his use of ibuprofen.  Patient also has a medicine for muscle spasm but he is not sure which medication he is currently taking to help with muscle spasms.  Past Medical History:  Diagnosis Date  . Diabetes mellitus without complication ([place])    type 1    Past Surgical History:  Procedure Laterality Date  . NO PAST SURGERIES     Family History  Problem Relation Age of Onset  . Diabetes Mother   . Cancer Mother   . Diabetes Sister   .  Diabetes Maternal Grandmother    Social History   Tobacco Use  . Smoking status: Current Every Day Smoker    Packs/day: 0.50    Types: Cigarettes  . Smokeless tobacco: Never Used  Substance Use Topics  . Alcohol use: Not Currently  . Drug use: Yes    Types: Marijuana    Comment: Several times per week   No Known Allergies  Review of Systems  Constitutional: Positive for malaise/fatigue (occasional). Negative for chills and fever.  HENT: Negative for congestion and sore throat.   Eyes: Negative for blurred vision and double vision.  Respiratory: Negative for cough and shortness of breath.   Cardiovascular: Negative for chest pain and palpitations.  Gastrointestinal: Negative for abdominal pain, constipation, diarrhea, nausea and vomiting.  Musculoskeletal: Positive for back pain and joint pain.  Skin: Negative for itching and rash.  Neurological: Positive for tingling. Negative for dizziness and headaches.  Endo/Heme/Allergies: Negative for polydipsia. Does not bruise/bleed easily.     Observations/Objective: No vital signs or physical exam done at today's visit as visit is done via telephone due to limitations/restrictions due to current COVID-19 pandemic  Assessment and Plan: 1. Type 1 diabetes mellitus with other specified complication [facility]) Patient is provided with refill of his 70/30 Humulin and patient is encouraged to contact his endocrinologist regarding change in dose.  Continue monitoring blood sugars.  Patient apparently was to be switched to Levemir per his note from his endocrinologist but patient should call to confirm medication and dose. - insulin NPH-regular Human (HUMULIN 70/30) (70-30) 100 UNIT/ML injection; Inject 47 units into the skin in the morning and 40 units before the evening meal; decrease nighttime dose to 35 units if blood sugar is 70 or less overnight  Dispense: 30 mL; Refill: 1  2. Diabetic peripheral neuropathy associated with type 1 diabetes  mellitus ([place]) Continue use of gabapentin for treatment of peripheral neuropathy  3. Chronic right shoulder pain Continue Orthopedic follow-up of adhesive capsulitis as per Dr. Marlou Sa in Orthopedics  4. Chronic midline low back pain with right-sided sciatica Continue use of currently prescribed medications for pain and muscle relaxants.  Follow-up with orthopedics   Follow Up Instructions:Return in about 4 months (around 09/09/2018) for chronic issues and as needed.    I discussed the assessment and treatment plan with the patient. The patient was provided an opportunity to ask questions and all were answered. The patient agreed with the plan and demonstrated an understanding of the instructions.   The patient was advised to call back or seek an in-person evaluation if the symptoms worsen or if the condition fails to improve as anticipated.  I provided 14  minutes of non-face-to-face time during this encounter.   [name], MD

## 2018-06-10 ENCOUNTER — Encounter: Payer: Medicaid Other | Attending: "Endocrinology | Admitting: Dietician

## 2018-06-10 ENCOUNTER — Encounter: Payer: Self-pay | Admitting: Dietician

## 2018-06-10 ENCOUNTER — Other Ambulatory Visit: Payer: Self-pay

## 2018-06-10 VITALS — BP 128/76 | Ht 70.0 in | Wt 218.9 lb

## 2018-06-10 DIAGNOSIS — E1042 Type 1 diabetes mellitus with diabetic polyneuropathy: Secondary | ICD-10-CM | POA: Insufficient documentation

## 2018-06-10 NOTE — Progress Notes (Signed)
Sent email to [facility] asking them to contact patient to schedule a counseling appointment

## 2018-06-10 NOTE — Patient Instructions (Addendum)
  Check blood sugars 4 x day before each meal and before bed every day  Bring blood sugar records to the next appointment  Eat 3 meals day and 1-2  snacks a day in afternoon and at bedtime  Eat 3 carbohydrate servings/meal + protein  Eat 1 carbohydrate serving/snack + protein  Space meals 4-5 hours apart  Avoid sugar sweetened drinks (soda, tea, coffee, sports drinks, juices)  Limit intake of sweets, fried foods and snack foods  Drink plenty of water  Complete 3 Day Food Record and bring to next appt-estimate carb grams  Make a dentist appointment as soon as possible   Carry fast acting glucose and a snack at all times  Carry medical alert ID at all times  Rotate injection sites  Take R sliding scale insulin before meals as directed by MD  Get urine ketone strips and test if BG's >300  Review insulin pump information  Consider free smoking cessation classes at [facility]  Return for appointment on:  06-26-18 at 10:30a

## 2018-06-10 NOTE — Progress Notes (Addendum)
Diabetes Self-Management Education  Visit Type: First/Initial  Appt. Start Time: 1315 Appt. End Time: 2446  06/10/2018  [name], identified by name and date of birth, is a 43 y.o. male with a diagnosis of Diabetes: Type 1.   ASSESSMENT  Blood pressure 128/76, height 5' 10"  (1.778 m), weight 218 lb 14.4 oz (99.3 kg). Body mass index is 31.41 kg/m.  Diabetes Self-Management Education - 06/10/18 1554      Visit Information   Visit Type  First/Initial      Initial Visit   Diabetes Type  Type 1      Psychosocial Assessment   Patient Belief/Attitude about Diabetes  Motivated to manage diabetes    Self-care barriers  None    Self-management support  Doctor's office;Family    Other persons present  Patient    Patient Concerns  Healthy Lifestyle;Glycemic Control   prevent complications, become more fit   Special Needs  None    Preferred Learning Style  Hands on    Learning Readiness  Ready    What is the last grade level you completed in school?  9th      Pre-Education Assessment   Patient understands the diabetes disease and treatment process.  Demonstrates understanding / competency    Patient understands incorporating nutritional management into lifestyle.  Needs Review    Patient undertands incorporating physical activity into lifestyle.  Needs Review    Patient understands using medications safely.  Needs Review -currently takes humulin 70/30 BID and R sliding scale-reports Medicaid has not approved Levemir and Novolog yet   Patient understands monitoring blood glucose, interpreting and using results  Needs Review    Patient understands prevention, detection, and treatment of acute complications.  Needs Review    Patient understands prevention, detection, and treatment of chronic complications.  Needs Review    Patient understands how to develop strategies to address psychosocial issues.  Needs Review    Patient understands how to develop strategies to promote  health/change behavior.  Needs Review      Complications   Last HgB A1C per patient/outside source  9.6 %   01-2018   How often do you check your blood sugar?  1-2 times/day   2-3x/day   Fasting Blood glucose range (mg/dL)  70-129;>200;180-200; ac lunch and supper varies 50's-200's    Postprandial Blood glucose range (mg/dL)  >200    Have you had a dilated eye exam in the past 12 months?  Yes   04-2018   Have you had a dental exam in the past 12 months?  Yes   2019   Are you checking your feet?  Yes    How many days per week are you checking your feet?  7      Dietary Intake   Breakfast  eats breakfast at 8a-8:30a; eats fast food 4x/wk    Snack (morning)  eats chips or crackers with peanut butter for AM snack    Lunch  eats lunch at 12:30p-1p; eats fast foods 4x/wk     Snack (afternoon)  eats chips or crackers with peanut butter in afternoon    Dinner  eats supper at 6:30p; eats fast food 3x/wk    Snack (evening)  eats chips or crackers with peanut butter     Beverage(s)  drinks water 8+x/day, diet sodas 2-3x/day, milk 1x/day and 0-1 fruit juice/day for treating low BG       Exercise   Exercise Type  ADL's  Patient Education   Previous Diabetes Education  Yes (please comment)   at [facility]   Nutrition management   Role of diet in the treatment of diabetes and the relationship between the three main macronutrients and blood glucose level;Carbohydrate counting;Food label reading, portion sizes and measuring food.    Medications  Taught/reviewed insulin injection, site rotation, insulin storage and needle disposal.;Reviewed patients medication for diabetes, action, purpose, timing of dose and side effects.   currently taking humulin 70/30 BID and R sliding scale; discussed basics of insulin pump therapy-gave pt infor packets on Omnipod, Tandem 2x, Medtronic pumps    Monitoring  Purpose and frequency of SMBG.;Identified appropriate SMBG and/or A1C goals.;Taught/discussed recording of test  results and interpretation of SMBG.;Taught/evaluated SMBG meter.    Acute complications  Taught treatment of hypoglycemia - the 15 rule.;Discussed and identified patients' treatment of hyperglycemia.    Chronic complications  Relationship between chronic complications and blood glucose control    Personal strategies to promote health  Lifestyle issues that need to be addressed for better diabetes care;Review risk of smoking and offered smoking cessation;Helped patient develop diabetes management plan for (enter comment)      Outcomes   Expected Outcomes  Demonstrated interest in learning. Expect positive outcomes       Individualized Plan for Diabetes Self-Management Training:   Learning Objective:  Patient will have a greater understanding of diabetes self-management. Patient education plan is to attend individual and/or group sessions per assessed needs and concerns.   Plan:   Patient Instructions   Check blood sugars 4 x day before each meal and before bed every day and record on log sheet  Bring blood sugar records to the next appointment  Eat 3 meals day and 1-2  snacks a day in afternoon and at bedtime  Eat 3 carbohydrate servings/meal + protein  Eat 1 carbohydrate serving/snack + protein  Space meals 4-5 hours apart  Avoid sugar sweetened drinks (soda, tea, coffee, sports drinks, juices)  Limit intake of sweets, fried foods and snack foods  Drink plenty of water  Complete 3 Day Food Record and bring to next appt-estimate carb grams  Make a dentist appointment as soon as possible   Carry fast acting glucose and a snack at all times  Carry medical alert ID at all times  Rotate injection sites  Take R sliding scale insulin before meals as directed by MD  Get urine ketone strips and test if BG's >300  Review insulin pump information  Quit smoking-consider free smoking cessation classes at [facility]  Return for appointment on:  06-26-18 at 10:30a   Expected  Outcomes:  Demonstrated interest in learning. Expect positive outcomes  Education material provided: General meal planning guidelines, Food Group handout, low and high BG handouts, Type 1 Diabetes 101 booklet, medical alert ID card and necklace coupon, smoking cessation class info handout, 1 tube glucose tablets for PRN use, info packets on Medtronic, Omnipod and Tandem 2x insulin pumps  If problems or questions, patient to contact team via: [phone]  Future DSME appointment:  06-26-18

## 2018-06-16 ENCOUNTER — Other Ambulatory Visit: Payer: Self-pay | Admitting: Family Medicine

## 2018-06-16 DIAGNOSIS — F419 Anxiety disorder, unspecified: Secondary | ICD-10-CM

## 2018-06-26 ENCOUNTER — Ambulatory Visit: Payer: Medicaid Other | Admitting: Dietician

## 2018-07-03 ENCOUNTER — Telehealth: Payer: Self-pay | Admitting: Dietician

## 2018-07-03 NOTE — Telephone Encounter (Signed)
Called patient to reschedule his cancelled appointment from 06/26/18, no answer x2.

## 2018-07-05 ENCOUNTER — Telehealth: Payer: Self-pay | Admitting: Dietician

## 2018-07-05 NOTE — Telephone Encounter (Signed)
Called patient's home to reschedule his cancelled appointment from 06/26/18. Spoke with his mother who rescheduled Ronald Banks appointment to 07/18/18 at 11:00am.

## 2018-07-18 ENCOUNTER — Encounter: Payer: Self-pay | Admitting: Dietician

## 2018-07-18 ENCOUNTER — Encounter: Payer: Medicaid Other | Attending: "Endocrinology | Admitting: Dietician

## 2018-07-18 ENCOUNTER — Other Ambulatory Visit: Payer: Self-pay

## 2018-07-18 VITALS — Ht 70.0 in | Wt 217.5 lb

## 2018-07-18 DIAGNOSIS — E1042 Type 1 diabetes mellitus with diabetic polyneuropathy: Secondary | ICD-10-CM

## 2018-07-18 NOTE — Progress Notes (Signed)
Diabetes Self-Management Education  Visit Type:   1:1  Appt. Start Time: 1045 Appt. End Time: 5885  07/18/2018  [name], identified by name and date of birth, is a 43 y.o. male with a diagnosis of Diabetes:  Type 1   ASSESSMENT Patient reports hypoglycemic episode at 1:30 am 07/18/18 -- woke up to his dog licking his face and tested BG at 31m/dl with no physical symptoms. He treated with 1/2 can soda and 2 cupcakes, then retested at 83, resumed sleep, and reports pre-breakfast BG of 142.   Height 5' 10"  (1.778 m), weight 217 lb 8 oz (98.7 kg). Body mass index is 31.21 kg/m.   Diabetes Self-Management Education - [id number]     Complications   How often do you check your blood sugar?  1-2 times/day  (Pended)     Fasting Blood glucose range (mg/dL)  70-129;>200;130-179;180-200   48-291   Postprandial Blood glucose range (mg/dL)  130-179;180-200;>200   usually elevated at nighttime, highest recently was 315 per patient; lowest 146   Number of hypoglycemic episodes per month  2    Can you tell when your blood sugar is low?  No   dog wakes him up   What do you do if your blood sugar is low?  drink regular soda followed by carbs cake, candy    Have you had a dilated eye exam in the past 12 months?  Yes    Have you had a dental exam in the past 12 months?  Yes    Are you checking your feet?  Yes    How many days per week are you checking your feet?  7   numbness in feet at times, and cramping in legs     Dietary Intake   Breakfast  2-3 meals and 3 snacks daily    Beverage(s)  water, diet soda      Exercise   Exercise Type  Light (walking / raking leaves)   works in [employer](mom has cancer)   How many days per week to you exercise?  4    How many minutes per day do you exercise?  120    Total minutes per week of exercise  480       Learning Objective:  Patient will have a greater understanding of diabetes self-management. Patient education plan is to attend  individual and/or group sessions per assessed needs and concerns. NOTE:  Patient reports estimating carb portions using his hand; estimates 10-15g carbohydrate per "handful".   Instructed patient on carb counting and advised using standard measurements for portions and accurate references including food lists, food labels, and online nutrition facts.   Discussed factors affecting BGs and insulin requirement, and importance of preventing rather than treating low and high BGs; discussed hypoglycmeia unawareness. Advised consistent and adequate carbohydrate intake and balanced meals to help lessen BG fluctuations.   Plan:   Patient Instructions   Use a measuring cup to measure out portions of starchy foods. Aim for 1/2 - 1 cup. For potatoes corn, peas, and beans and cereal, each 1/2 cup = 15 grams carb (1 cup = 30 grams).       For rice and pasta/ noodles, each 1/3 cup = 15grams carb (1 cup = 45grams).  Aim for about 45 grams of carbohydrate with each meal, 3 times a day.   Eat an extra snack on days you are doing gardening work.      Expected Outcomes:  (  P) Demonstrated interest in learning. Expect positive outcomes  Education material provided: Planning a Balanced Meal  If problems or questions, patient to contact team via:  Phone and Email  Future DSME appointment: - (P) 2 wks

## 2018-07-18 NOTE — Patient Instructions (Signed)
   Use a measuring cup to measure out portions of starchy foods. Aim for 1/2 - 1 cup. For potatoes corn, peas, and beans and cereal, each 1/2 cup = 15 grams carb (1 cup = 30 grams).       For rice and pasta/ noodles, each 1/3 cup = 15grams carb (1 cup = 45grams).  Aim for about 45 grams of carbohydrate with each meal, 3 times a day.   Eat an extra snack on days you are doing gardening work.

## 2018-07-19 ENCOUNTER — Encounter: Payer: Self-pay | Admitting: Orthopedic Surgery

## 2018-07-19 ENCOUNTER — Ambulatory Visit (INDEPENDENT_AMBULATORY_CARE_PROVIDER_SITE_OTHER): Payer: Medicaid Other | Admitting: Orthopedic Surgery

## 2018-07-19 DIAGNOSIS — M79641 Pain in right hand: Secondary | ICD-10-CM | POA: Diagnosis not present

## 2018-07-19 DIAGNOSIS — M79642 Pain in left hand: Secondary | ICD-10-CM

## 2018-07-19 DIAGNOSIS — G5603 Carpal tunnel syndrome, bilateral upper limbs: Secondary | ICD-10-CM | POA: Diagnosis not present

## 2018-07-19 DIAGNOSIS — M720 Palmar fascial fibromatosis [Dupuytren]: Secondary | ICD-10-CM

## 2018-07-19 NOTE — Progress Notes (Signed)
Office Visit Note   Patient: Ronald Banks           Date of Birth: 1975/02/21           MRN: [id number] Visit Date: 07/19/2018 Requested by: [name], MD [place],  [place] [address] PCP: [name], MD  Subjective: Chief Complaint  Patient presents with  . Right Shoulder - Pain    HPI: [name] is a patient here for follow-up of right frozen shoulder.  Had an injection last clinic visit and he is doing much better with that.  His range of motion has improved some and this pain has decreased.  Patient also describes bilateral hand numbness and tingling left worse than right.  He is right-hand dominant but he does a lot of things with the left hand.  He is concerned about some contractures developing in his palm.  Also reports numbness and tingling and loss of dexterity left more than right.  States that he has both volar and dorsal numbness in the hands but denies any neck pain or radicular symptoms.              ROS: All systems reviewed are negative as they relate to the chief complaint within the history of present illness.  Patient denies  fevers or chills.   Assessment & Plan: Visit Diagnoses:  1. Bilateral hand pain   2. Carpal tunnel syndrome, bilateral upper limbs   3. Dupuytren's disease of palm of both hands     Plan: Impression is bilateral hand Dupuytren's disease and carpal tunnel syndrome.  Right shoulder has improved motion and increasingly functional pain-free motion.  Plan is no further action on the shoulder but continue with his daily rehab exercises of primarily stretching.  For the hands he has fairly debilitating carpal tunnel syndromes.  I think he needs nerve conduction study both hands to evaluate carpal tunnel.  He is got little pitting in early cord formation in the palm but no significant flexion contracture yet of the MCP or PIP joints.  That something we can follow.  Follow-Up Instructions: No follow-ups on file.   Orders:   Orders Placed This Encounter  Procedures  . Ambulatory referral to Physical Medicine Rehab   No orders of the defined types were placed in this encounter.     Procedures: No procedures performed   Clinical Data: No additional findings.  Objective: Vital Signs: There were no vitals taken for this visit.  Physical Exam:   Constitutional: Patient appears well-developed HEENT:  Head: Normocephalic Eyes:EOM are normal Neck: Normal range of motion Cardiovascular: Normal rate Pulmonary/chest: Effort normal Neurologic: Patient is alert Skin: Skin is warm Psychiatric: Patient has normal mood and affect    Ortho Exam: Ortho exam demonstrates good shoulder range of motion lacking only about 15 degrees of full flexion on the right compared to the left.  External rotation is also only about 15 degrees difference side to side.  He also has some early Dupuytren's contractures in the palm affecting MCP joints 3 and 4 in both hands.  It is only about 5 to 10 degrees most of a flexion contracture with no PIP or DIP joint involvement.  He also reports numbness and paresthesias on both the dorsal and palmar aspect of the hands.  He does have bilateral subluxating ulnar nerve however negative Tinel's in this region is present.  Radial pulse intact bilaterally.  Specialty Comments:  No specialty comments available.  Imaging: No results found.  PMFS History: Patient Active Problem List   Diagnosis Date Noted  . Diabetic peripheral neuropathy associated with type 1 diabetes mellitus ([place]) 02/11/2018  . Hyperlipidemia LDL goal <70 02/11/2018  . Hypothyroidism 02/11/2018  . Chronic right shoulder pain 02/11/2018  . Chronic midline low back pain with right-sided sciatica 02/11/2018  . Anxiety 02/11/2018   Past Medical History:  Diagnosis Date  . Diabetes mellitus without complication ([place])    type 1   . Hyperlipidemia   . Thyroid disease     Family History  Problem Relation Age of  Onset  . Diabetes Mother   . Cancer Mother   . Diabetes Sister   . Diabetes Maternal Grandmother     Past Surgical History:  Procedure Laterality Date  . NO PAST SURGERIES     Social History   Occupational History  . Not on file  Tobacco Use  . Smoking status: Current Every Day Smoker    Packs/day: 0.50    Types: Cigarettes  . Smokeless tobacco: Never Used  Substance and Sexual Activity  . Alcohol use: Not Currently  . Drug use: Yes    Types: Marijuana    Comment: Several times per week  . Sexual activity: Yes

## 2018-08-08 ENCOUNTER — Telehealth: Payer: Self-pay | Admitting: Dietician

## 2018-08-08 ENCOUNTER — Encounter: Payer: Self-pay | Admitting: Dietician

## 2018-08-08 NOTE — Progress Notes (Signed)
Sent letter to patient along with review of his food dairy. Will schedule RN follow-up.

## 2018-08-08 NOTE — Telephone Encounter (Signed)
Called patient to schedule follow-up visit with RN, no answer.

## 2018-08-09 ENCOUNTER — Encounter: Payer: Self-pay | Admitting: Physical Medicine and Rehabilitation

## 2018-08-09 ENCOUNTER — Ambulatory Visit (INDEPENDENT_AMBULATORY_CARE_PROVIDER_SITE_OTHER): Payer: Medicaid Other | Admitting: Physical Medicine and Rehabilitation

## 2018-08-09 ENCOUNTER — Other Ambulatory Visit: Payer: Self-pay

## 2018-08-09 DIAGNOSIS — R202 Paresthesia of skin: Secondary | ICD-10-CM | POA: Diagnosis not present

## 2018-08-09 NOTE — Progress Notes (Signed)
 .  Numeric Pain Rating Scale and Functional Assessment Average Pain 8   In the last MONTH (on 0-10 scale) has pain interfered with the following?  1. General activity like being  able to carry out your everyday physical activities such as walking, climbing stairs, carrying groceries, or moving a chair?  Rating(8)

## 2018-08-12 ENCOUNTER — Encounter: Payer: Self-pay | Admitting: Dietician

## 2018-08-12 NOTE — Procedures (Signed)
EMG & NCV Findings: Evaluation of the left median motor nerve showed decreased conduction velocity (Elbow-Wrist, 42 m/s).  The right median motor nerve showed prolonged distal onset latency (4.4 ms) and decreased conduction velocity (Elbow-Wrist, 49 m/s).  The left ulnar motor nerve showed reduced amplitude (1.5 mV) and decreased conduction velocity (B Elbow-Wrist, 44 m/s).  The left median (across palm) sensory nerve showed no response (Palm) and prolonged distal peak latency (3.7 ms).  The right median (across palm) sensory and the right radial sensory nerves showed prolonged distal peak latency (R3.9, R4.1 ms).  The left ulnar sensory nerve showed prolonged distal peak latency (4.2 ms), reduced amplitude (5.8 V), and decreased conduction velocity (Wrist-5th Digit, 33 m/s).  The right ulnar sensory nerve showed reduced amplitude (13.8 V).  All remaining nerves (as indicated in the following tables) were within normal limits.  Left vs. Right side comparison data for the ulnar motor nerve indicates abnormal L-R latency difference (0.9 ms), abnormal L-R amplitude difference (80.3 %), and abnormal L-R velocity difference (B Elbow-Wrist, 12 m/s).  The ulnar sensory nerve indicates abnormal L-R latency difference (0.7 ms).  All remaining left vs. right side differences were within normal limits.    Needle evaluation of the left first dorsal interosseous muscle showed diminished recruitment.  All remaining muscles (as indicated in the following table) showed no evidence of electrical instability.    Impression: The above electrodiagnostic study is ABNORMAL and reveals evidence of:  1.  A moderate right median nerve entrapment at the wrist (carpal tunnel syndrome) affecting sensory and motor components.    2. A mild to moderate left median nerve entrapment at the wrist which seems to be asymptomatic given that his left-sided symptoms are more ring finger and fifth digit.    3.  **Highly suggestive of  underlying peripheral neuropathy of bilateral upper extremities.   There is no significant electrodiagnostic evidence of any other focal nerve entrapment, brachial plexopathy or cervical radiculopathy.   Recommendations: 1.  Follow-up with referring physician. 2.  Continue current management of symptoms. 3.  Suggest potential for diagnostic carpal tunnel injection and surgical evaluation but clinical correlation is paramount given the likely underlying peripheral polyneuropathy.  ___________________________ Laurence Spates FAAPMR Board Certified, American Board of Physical Medicine and Rehabilitation    Nerve Conduction Studies Anti Sensory Summary Table   Stim Site NR Peak (ms) Norm Peak (ms) P-T Amp (V) Norm P-T Amp Site1 Site2 Delta-P (ms) Dist (cm) Vel (m/s) Norm Vel (m/s)  Left Median Acr Palm Anti Sensory (2nd Digit)  32.5C  Wrist    *3.7 <3.6 14.8 >10 Wrist Palm  0.0    Palm *NR  <2.0          Right Median Acr Palm Anti Sensory (2nd Digit)  31.6C  Wrist    *3.9 <3.6 10.8 >10        Left Radial Anti Sensory (Base 1st Digit)  35.3C  Wrist    2.5 <3.1 8.4  Wrist Base 1st Digit 2.5 0.0    Right Radial Anti Sensory (Base 1st Digit)  32.4C  Wrist    *4.1 <3.1 3.9  Wrist Base 1st Digit 4.1 0.0    Left Ulnar Anti Sensory (5th Digit)  33.2C  Wrist    *4.2 <3.7 *5.8 >15.0 Wrist 5th Digit 4.2 14.0 *33 >38  Right Ulnar Anti Sensory (5th Digit)  32.3C  Wrist    3.5 <3.7 *13.8 >15.0 Wrist 5th Digit 3.5 14.0 40 >38   Motor Summary  Table   Stim Site NR Onset (ms) Norm Onset (ms) O-P Amp (mV) Norm O-P Amp Site1 Site2 Delta-0 (ms) Dist (cm) Vel (m/s) Norm Vel (m/s)  Left Median Motor (Abd Poll Brev)  35.6C  Wrist    3.9 <4.2 7.8 >5 Elbow Wrist 5.2 22.0 *42 >50  Elbow    9.1  7.3         Right Median Motor (Abd Poll Brev)  32.3C  Wrist    *4.4 <4.2 5.3 >5 Elbow Wrist 4.8 23.5 *49 >50  Elbow    9.2  4.7         Left Ulnar Motor (Abd Dig Min)  34.9C  Wrist    4.1 <4.2 *1.5 >3 B  Elbow Wrist 5.0 22.0 *44 >53  B Elbow    9.1  1.2  A Elbow B Elbow 1.7 9.0 53 >53  A Elbow    10.8  1.4         Right Ulnar Motor (Abd Dig Min)  31.7C  Wrist    3.2 <4.2 7.6 >3 B Elbow Wrist 4.3 24.0 56 >53  B Elbow    7.5  6.6  A Elbow B Elbow 1.6 10.0 63 >53  A Elbow    9.1  6.2          EMG   Side Muscle Nerve Root Ins Act Fibs Psw Amp Dur Poly Recrt Int Fraser Din Comment  Left Abd Poll Brev Median C8-T1 Nml Nml Nml Nml Nml 0 Nml Nml   Left 1stDorInt Ulnar C8-T1 Nml Nml Nml Nml Nml 0 *Reduced Nml   Left PronatorTeres Median C6-7 Nml Nml Nml Nml Nml 0 Nml Nml   Left Biceps Musculocut C5-6 Nml Nml Nml Nml Nml 0 Nml Nml   Left Deltoid Axillary C5-6 Nml Nml Nml Nml Nml 0 Nml Nml     Nerve Conduction Studies Anti Sensory Left/Right Comparison   Stim Site L Lat (ms) R Lat (ms) L-R Lat (ms) L Amp (V) R Amp (V) L-R Amp (%) Site1 Site2 L Vel (m/s) R Vel (m/s) L-R Vel (m/s)  Median Acr Palm Anti Sensory (2nd Digit)  32.5C  Wrist *3.7 *3.9 0.2 14.8 10.8 27.0 Wrist Palm     Palm             Radial Anti Sensory (Base 1st Digit)  35.3C  Wrist 2.5 *4.1 1.6 8.4 3.9 53.6 Wrist Base 1st Digit     Ulnar Anti Sensory (5th Digit)  33.2C  Wrist *4.2 3.5 *0.7 *5.8 *13.8 58.0 Wrist 5th Digit *33 40 7   Motor Left/Right Comparison   Stim Site L Lat (ms) R Lat (ms) L-R Lat (ms) L Amp (mV) R Amp (mV) L-R Amp (%) Site1 Site2 L Vel (m/s) R Vel (m/s) L-R Vel (m/s)  Median Motor (Abd Poll Brev)  35.6C  Wrist 3.9 *4.4 0.5 7.8 5.3 32.1 Elbow Wrist *42 *49 7  Elbow 9.1 9.2 0.1 7.3 4.7 35.6       Ulnar Motor (Abd Dig Min)  34.9C  Wrist 4.1 3.2 *0.9 *1.5 7.6 *80.3 B Elbow Wrist *44 56 *12  B Elbow 9.1 7.5 1.6 1.2 6.6 81.8 A Elbow B Elbow 53 63 10  A Elbow 10.8 9.1 1.7 1.4 6.2 77.4          Waveforms:

## 2018-08-12 NOTE — Progress Notes (Signed)
Called pt's home number  for FU but no answer x2 and unable to leave a message. Also called pt's cell number x3 but line busy x3

## 2018-08-12 NOTE — Progress Notes (Signed)
Ronald Banks - 43 y.o. male MRN [id number]  Date of birth: 1975-01-25  Office Visit Note: Visit Date: 08/09/2018 PCP: [name], MD Referred by: [name], MD  Subjective: Chief Complaint  Patient presents with  . Right Arm - Numbness, Pain  . Left Arm - Numbness, Pain  . Right Hand - Numbness, Pain  . Left Hand - Pain, Numbness   HPI: Ronald Banks is a 43 y.o. male who comes in today At the request of Dr. Anderson Malta for electrodiagnostic study of the bilateral upper limbs.  Patient reports 10 months of significant symptoms in both hands and in particular the arm and left hand into the fourth and fifth digits.  Patient is left-hand dominant.  He also reports right hand symptoms and feels this is in all of his fingers nondermatomal he.  He reports cold weather makes the pain worse and nothing is really helped with the symptoms.  He currently takes Cymbalta as well as gabapentin.  He is a type I diabetic.  Last hemoglobin A1c was 9.0.  He does report burning and numbness in the feet.  ROS Otherwise per HPI.  Assessment & Plan: Visit Diagnoses:  1. Paresthesia of skin     Plan: Impression: The above electrodiagnostic study is ABNORMAL and reveals evidence of:  1.  A moderate right median nerve entrapment at the wrist (carpal tunnel syndrome) affecting sensory and motor components.    2. A mild to moderate left median nerve entrapment at the wrist which seems to be asymptomatic given that his left-sided symptoms are more ring finger and fifth digit.    3.  **Highly suggestive of underlying peripheral neuropathy of bilateral upper extremities.   There is no significant electrodiagnostic evidence of any other focal nerve entrapment, brachial plexopathy or cervical radiculopathy.   Recommendations: 1.  Follow-up with referring physician. 2.  Continue current management of symptoms. 3.  Suggest potential for diagnostic carpal tunnel injection and surgical evaluation but  clinical correlation is paramount given the likely underlying peripheral polyneuropathy.  Meds & Orders: No orders of the defined types were placed in this encounter.   Orders Placed This Encounter  Procedures  . NCV with EMG (electromyography)    Follow-up: Return for  Anderson Malta, M.D..   Procedures: No procedures performed  EMG & NCV Findings: Evaluation of the left median motor nerve showed decreased conduction velocity (Elbow-Wrist, 42 m/s).  The right median motor nerve showed prolonged distal onset latency (4.4 ms) and decreased conduction velocity (Elbow-Wrist, 49 m/s).  The left ulnar motor nerve showed reduced amplitude (1.5 mV) and decreased conduction velocity (B Elbow-Wrist, 44 m/s).  The left median (across palm) sensory nerve showed no response (Palm) and prolonged distal peak latency (3.7 ms).  The right median (across palm) sensory and the right radial sensory nerves showed prolonged distal peak latency (R3.9, R4.1 ms).  The left ulnar sensory nerve showed prolonged distal peak latency (4.2 ms), reduced amplitude (5.8 V), and decreased conduction velocity (Wrist-5th Digit, 33 m/s).  The right ulnar sensory nerve showed reduced amplitude (13.8 V).  All remaining nerves (as indicated in the following tables) were within normal limits.  Left vs. Right side comparison data for the ulnar motor nerve indicates abnormal L-R latency difference (0.9 ms), abnormal L-R amplitude difference (80.3 %), and abnormal L-R velocity difference (B Elbow-Wrist, 12 m/s).  The ulnar sensory nerve indicates abnormal L-R latency difference (0.7 ms).  All remaining left vs. right side differences were  within normal limits.    Needle evaluation of the left first dorsal interosseous muscle showed diminished recruitment.  All remaining muscles (as indicated in the following table) showed no evidence of electrical instability.    Impression: The above electrodiagnostic study is ABNORMAL and reveals evidence  of:  1.  A moderate right median nerve entrapment at the wrist (carpal tunnel syndrome) affecting sensory and motor components.    2. A mild to moderate left median nerve entrapment at the wrist which seems to be asymptomatic given that his left-sided symptoms are more ring finger and fifth digit.    3.  **Highly suggestive of underlying peripheral neuropathy of bilateral upper extremities.   There is no significant electrodiagnostic evidence of any other focal nerve entrapment, brachial plexopathy or cervical radiculopathy.   Recommendations: 1.  Follow-up with referring physician. 2.  Continue current management of symptoms. 3.  Suggest potential for diagnostic carpal tunnel injection and surgical evaluation but clinical correlation is paramount given the likely underlying peripheral polyneuropathy.  ___________________________ Laurence Spates FAAPMR Board Certified, American Board of Physical Medicine and Rehabilitation    Nerve Conduction Studies Anti Sensory Summary Table   Stim Site NR Peak (ms) Norm Peak (ms) P-T Amp (V) Norm P-T Amp Site1 Site2 Delta-P (ms) Dist (cm) Vel (m/s) Norm Vel (m/s)  Left Median Acr Palm Anti Sensory (2nd Digit)  32.5C  Wrist    *3.7 <3.6 14.8 >10 Wrist Palm  0.0    Palm *NR  <2.0          Right Median Acr Palm Anti Sensory (2nd Digit)  31.6C  Wrist    *3.9 <3.6 10.8 >10        Left Radial Anti Sensory (Base 1st Digit)  35.3C  Wrist    2.5 <3.1 8.4  Wrist Base 1st Digit 2.5 0.0    Right Radial Anti Sensory (Base 1st Digit)  32.4C  Wrist    *4.1 <3.1 3.9  Wrist Base 1st Digit 4.1 0.0    Left Ulnar Anti Sensory (5th Digit)  33.2C  Wrist    *4.2 <3.7 *5.8 >15.0 Wrist 5th Digit 4.2 14.0 *33 >38  Right Ulnar Anti Sensory (5th Digit)  32.3C  Wrist    3.5 <3.7 *13.8 >15.0 Wrist 5th Digit 3.5 14.0 40 >38   Motor Summary Table   Stim Site NR Onset (ms) Norm Onset (ms) O-P Amp (mV) Norm O-P Amp Site1 Site2 Delta-0 (ms) Dist (cm) Vel (m/s) Norm Vel  (m/s)  Left Median Motor (Abd Poll Brev)  35.6C  Wrist    3.9 <4.2 7.8 >5 Elbow Wrist 5.2 22.0 *42 >50  Elbow    9.1  7.3         Right Median Motor (Abd Poll Brev)  32.3C  Wrist    *4.4 <4.2 5.3 >5 Elbow Wrist 4.8 23.5 *49 >50  Elbow    9.2  4.7         Left Ulnar Motor (Abd Dig Min)  34.9C  Wrist    4.1 <4.2 *1.5 >3 B Elbow Wrist 5.0 22.0 *44 >53  B Elbow    9.1  1.2  A Elbow B Elbow 1.7 9.0 53 >53  A Elbow    10.8  1.4         Right Ulnar Motor (Abd Dig Min)  31.7C  Wrist    3.2 <4.2 7.6 >3 B Elbow Wrist 4.3 24.0 56 >53  B Elbow    7.5  6.6  A  Elbow B Elbow 1.6 10.0 63 >53  A Elbow    9.1  6.2          EMG   Side Muscle Nerve Root Ins Act Fibs Psw Amp Dur Poly Recrt Int Fraser Din Comment  Left Abd Poll Brev Median C8-T1 Nml Nml Nml Nml Nml 0 Nml Nml   Left 1stDorInt Ulnar C8-T1 Nml Nml Nml Nml Nml 0 *Reduced Nml   Left PronatorTeres Median C6-7 Nml Nml Nml Nml Nml 0 Nml Nml   Left Biceps Musculocut C5-6 Nml Nml Nml Nml Nml 0 Nml Nml   Left Deltoid Axillary C5-6 Nml Nml Nml Nml Nml 0 Nml Nml     Nerve Conduction Studies Anti Sensory Left/Right Comparison   Stim Site L Lat (ms) R Lat (ms) L-R Lat (ms) L Amp (V) R Amp (V) L-R Amp (%) Site1 Site2 L Vel (m/s) R Vel (m/s) L-R Vel (m/s)  Median Acr Palm Anti Sensory (2nd Digit)  32.5C  Wrist *3.7 *3.9 0.2 14.8 10.8 27.0 Wrist Palm     Palm             Radial Anti Sensory (Base 1st Digit)  35.3C  Wrist 2.5 *4.1 1.6 8.4 3.9 53.6 Wrist Base 1st Digit     Ulnar Anti Sensory (5th Digit)  33.2C  Wrist *4.2 3.5 *0.7 *5.8 *13.8 58.0 Wrist 5th Digit *33 40 7   Motor Left/Right Comparison   Stim Site L Lat (ms) R Lat (ms) L-R Lat (ms) L Amp (mV) R Amp (mV) L-R Amp (%) Site1 Site2 L Vel (m/s) R Vel (m/s) L-R Vel (m/s)  Median Motor (Abd Poll Brev)  35.6C  Wrist 3.9 *4.4 0.5 7.8 5.3 32.1 Elbow Wrist *42 *49 7  Elbow 9.1 9.2 0.1 7.3 4.7 35.6       Ulnar Motor (Abd Dig Min)  34.9C  Wrist 4.1 3.2 *0.9 *1.5 7.6 *80.3 B Elbow Wrist *44  56 *12  B Elbow 9.1 7.5 1.6 1.2 6.6 81.8 A Elbow B Elbow 53 63 10  A Elbow 10.8 9.1 1.7 1.4 6.2 77.4          Waveforms:                     Clinical History: No specialty comments available.   He reports that he has been smoking cigarettes. He has been smoking about 0.50 packs per day. He has never used smokeless tobacco.  Recent Labs    02/11/18 1228  HGBA1C 9.6*    Objective:  VS:  HT:    WT:   BMI:     BP:   HR: bpm  TEMP: ( )  RESP:  Physical Exam Musculoskeletal:        General: No tenderness.     Comments: Inspection reveals no atrophy of the bilateral APB or FDI or hand intrinsics. There is no swelling, color changes, allodynia or dystrophic changes. There is 5 out of 5 strength in the bilateral wrist extension, finger abduction and long finger flexion. There is intact sensation to light touch in all dermatomal and peripheral nerve distributions. There is a negative Froment's test bilaterally.  There is a negative Hoffmann's test bilaterally.  Skin:    General: Skin is warm and dry.     Findings: No erythema or rash.  Neurological:     General: No focal deficit present.     Mental Status: He is alert and oriented to person, place, and time.  Sensory: No sensory deficit.     Motor: No weakness or abnormal muscle tone.     Coordination: Coordination normal.     Gait: Gait normal.  Psychiatric:        Mood and Affect: Mood normal.        Behavior: Behavior normal.        Thought Content: Thought content normal.     Ortho Exam Imaging: No results found.  Past Medical/Family/Surgical/Social History: Medications & Allergies reviewed per EMR, new medications updated. Patient Active Problem List   Diagnosis Date Noted  . Diabetic peripheral neuropathy associated with type 1 diabetes mellitus ([place]) 02/11/2018  . Hyperlipidemia LDL goal <70 02/11/2018  . Hypothyroidism 02/11/2018  . Chronic right shoulder pain 02/11/2018  . Chronic midline low back  pain with right-sided sciatica 02/11/2018  . Anxiety 02/11/2018   Past Medical History:  Diagnosis Date  . Diabetes mellitus without complication ([place])    type 1   . Hyperlipidemia   . Thyroid disease    Family History  Problem Relation Age of Onset  . Diabetes Mother   . Cancer Mother   . Diabetes Sister   . Diabetes Maternal Grandmother    Past Surgical History:  Procedure Laterality Date  . NO PAST SURGERIES     Social History   Occupational History  . Not on file  Tobacco Use  . Smoking status: Current Every Day Smoker    Packs/day: 0.50    Types: Cigarettes  . Smokeless tobacco: Never Used  Substance and Sexual Activity  . Alcohol use: Not Currently  . Drug use: Yes    Types: Marijuana    Comment: Several times per week  . Sexual activity: Yes

## 2018-08-15 ENCOUNTER — Ambulatory Visit (INDEPENDENT_AMBULATORY_CARE_PROVIDER_SITE_OTHER): Payer: Medicaid Other | Admitting: Orthopedic Surgery

## 2018-08-15 ENCOUNTER — Encounter: Payer: Self-pay | Admitting: Orthopedic Surgery

## 2018-08-15 DIAGNOSIS — G5603 Carpal tunnel syndrome, bilateral upper limbs: Secondary | ICD-10-CM

## 2018-08-15 NOTE — Progress Notes (Signed)
Office Visit Note   Patient: Ronald Banks           Date of Birth: 15-Jan-1976           MRN: [id number] Visit Date: 08/15/2018 Requested by: [name], MD [place],  [place] [id number] PCP: [name], MD  Subjective: Chief Complaint  Patient presents with  . Follow-up    HPI: Ronald Banks is a 43 y.o. male who presents to the office complaining of bilateral hand pain. Pt states that his left hand hurts worse than his right hand.  He complains of constant numbness and tingling in his hand and fingers, worst in the middle and ring fingers.  Symptoms are present all the time, and occasionally wake him up at night.  He has not tried any splinting, injections.  No history of surgery on either hand.  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  He has a history of T1DM with an A1c of 9.6 six months ago.  He has a physical job, working in Teacher, adult education care.                ROS: All systems reviewed are negative as they relate to the chief complaint within the history of present illness.  Patient denies  fevers or chills.   Assessment & Plan: Visit Diagnoses: No diagnosis found.  Plan: Pt is a 43 y.o. 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 The left is causing significantly more discomfort relative to the right hand.  Discussed options including splinting, injections, and carpal tunnel release.  His A1c is too high to consider injections at this time.   I do not think that surgery would be a great option at this point either, due to reduced healing potential in an individual with less-controlled DM.    Pt understands. Recommended that patient try splinting for the left wrist, especially during the night.  Pt agrees with plan and will f/u in 8 weeks.    Patient may require surgery at some time but this is his busy season.  His clinical symptoms are slightly worse than his EMG nerve study findings of  moderate carpal tunnel syndrome on the left.  He does not desire surgery at this time which is understandable due to his busy season.  Follow-Up Instructions: No follow-ups on file.   Orders:  No orders of the defined types were placed in this encounter.  No orders of the defined types were placed in this encounter.     Procedures: No procedures performed   Clinical Data: No additional findings.  Objective: Vital Signs: There were no vitals taken for this visit.  Physical Exam:   Constitutional: Patient appears well-developed HEENT:  Head: Normocephalic Eyes:EOM are normal Neck: Normal range of motion Cardiovascular: Normal rate Pulmonary/chest: Effort normal Neurologic: Patient is alert Skin: Skin is warm Psychiatric: Patient has normal mood and affect    Ortho Exam:  Right hand exam: Reduced sensation of all 5 fingers No atrophy of the thenar muscles No worsening of symptoms with Tinels or Durkins  Left hand exam Reduced sensation of all 5 fingers No atrophy of the thenar muscles Worsening of symptoms with Tinels   Specialty Comments:  No specialty comments available.  Imaging: No results found.   PMFS History: Patient Active Problem List   Diagnosis Date Noted  . Diabetic peripheral neuropathy associated with type  1 diabetes mellitus ([place]) 02/11/2018  . Hyperlipidemia LDL goal <70 02/11/2018  . Hypothyroidism 02/11/2018  . Chronic right shoulder pain 02/11/2018  . Chronic midline low back pain with right-sided sciatica 02/11/2018  . Anxiety 02/11/2018   Past Medical History:  Diagnosis Date  . Diabetes mellitus without complication ([place])    type 1   . Hyperlipidemia   . Thyroid disease     Family History  Problem Relation Age of Onset  . Diabetes Mother   . Cancer Mother   . Diabetes Sister   . Diabetes Maternal Grandmother     Past Surgical History:  Procedure Laterality Date  . NO PAST SURGERIES     Social History    Occupational History  . Not on file  Tobacco Use  . Smoking status: Current Every Day Smoker    Packs/day: 0.50    Types: Cigarettes  . Smokeless tobacco: Never Used  Substance and Sexual Activity  . Alcohol use: Not Currently  . Drug use: Yes    Types: Marijuana    Comment: Several times per week  . Sexual activity: Yes

## 2018-08-26 ENCOUNTER — Encounter: Payer: Self-pay | Admitting: Dietician

## 2018-09-11 ENCOUNTER — Other Ambulatory Visit: Payer: Self-pay

## 2018-09-11 ENCOUNTER — Encounter: Payer: Self-pay | Admitting: Family Medicine

## 2018-09-11 ENCOUNTER — Ambulatory Visit: Payer: Medicaid Other | Attending: Family Medicine | Admitting: Family Medicine

## 2018-09-11 VITALS — BP 122/72 | HR 62 | Ht 70.0 in | Wt 212.8 lb

## 2018-09-11 DIAGNOSIS — R413 Other amnesia: Secondary | ICD-10-CM

## 2018-09-11 DIAGNOSIS — E039 Hypothyroidism, unspecified: Secondary | ICD-10-CM

## 2018-09-11 DIAGNOSIS — Z79899 Other long term (current) drug therapy: Secondary | ICD-10-CM | POA: Diagnosis not present

## 2018-09-11 DIAGNOSIS — Z794 Long term (current) use of insulin: Secondary | ICD-10-CM | POA: Insufficient documentation

## 2018-09-11 DIAGNOSIS — Z79890 Hormone replacement therapy (postmenopausal): Secondary | ICD-10-CM | POA: Diagnosis not present

## 2018-09-11 DIAGNOSIS — E1065 Type 1 diabetes mellitus with hyperglycemia: Secondary | ICD-10-CM

## 2018-09-11 DIAGNOSIS — D72829 Elevated white blood cell count, unspecified: Secondary | ICD-10-CM

## 2018-09-11 DIAGNOSIS — Z833 Family history of diabetes mellitus: Secondary | ICD-10-CM | POA: Diagnosis not present

## 2018-09-11 DIAGNOSIS — M545 Low back pain, unspecified: Secondary | ICD-10-CM

## 2018-09-11 DIAGNOSIS — E785 Hyperlipidemia, unspecified: Secondary | ICD-10-CM | POA: Diagnosis not present

## 2018-09-11 DIAGNOSIS — F17210 Nicotine dependence, cigarettes, uncomplicated: Secondary | ICD-10-CM | POA: Diagnosis not present

## 2018-09-11 DIAGNOSIS — G8929 Other chronic pain: Secondary | ICD-10-CM | POA: Diagnosis not present

## 2018-09-11 DIAGNOSIS — E1042 Type 1 diabetes mellitus with diabetic polyneuropathy: Secondary | ICD-10-CM | POA: Diagnosis not present

## 2018-09-11 NOTE — Progress Notes (Signed)
Established Patient Office Visit  Subjective:  Patient ID: Ronald Banks, male    DOB: 1975-10-05  Age: 43 y.o. MRN: [id number]  CC: No chief complaint on file.   HPI Ronald Banks presents for follow-up of chronic medical issues.  Patient was last seen in this office on 05/10/2018 patient states that he sees an endocrinologist in [place].  Patient believes that he has an upcoming appointment within the next 1 to 2 weeks with his endocrinologist.  Patient cannot recall when he last had blood work done.  He reports that his blood sugars continue to fluctuate and are often 200 or greater when he checks them.  He continues to have some issues with his painful numbness in his feet related to his diabetes.  He also has some numbness in his hands but states that he recently saw orthopedics and was told that he has carpal tunnel syndrome.  Patient is not interested in surgery at this time.  Patient believes he has had a diabetic eye exam within the past 12 months.  1 of patient's complaints at today's visit is that he feels as if he is having issues with his memory.  He sometimes cannot recall names and faces of people whom he knows well and sometimes forgets whether or not he is taking his medications.  He believes that this issue has been going on for more than a year but has become worse over the last 3 to 4 months.       He also continues to have issues with chronic low back pain which can radiate down either leg.  He does not currently have any radiation of his back pain which is currently centered in his low back.  He would like to see a specialist regarding his back pain.  Back pain ranges from an 8-10 or greater on a 0-to-10 scale with 10 being the worst imaginable pain.  He has been prescribed muscle relaxants in the past as he states that this also helps him to sleep secondary to his back pain as well as nerve pain in his feet and patient also takes gabapentin.  He takes duloxetine daily to  help with pain.  Patient also takes over-the-counter ibuprofen as needed for pain.  He denies any abdominal pain, no blood in the stools and no black stools.  He does not believe that his use of cholesterol medication is contributing to muscle or joint pain.  He reports that he does take his lisinopril without difficulty.  No issues with a dry cough.  He also continues to take medication for hypothyroidism.  He does have fatigue but denies peripheral edema or constipation at this time.  Past Medical History:  Diagnosis Date   Diabetes mellitus without complication ([place])    type 1    Hyperlipidemia    Thyroid disease     Past Surgical History:  Procedure Laterality Date   NO PAST SURGERIES      Family History  Problem Relation Age of Onset   Diabetes Mother    Cancer Mother    Diabetes Sister    Diabetes Maternal Grandmother     Social History   Socioeconomic History   Marital status: Single    Spouse name: Not on file   Number of children: Not on file   Years of education: Not on file   Highest education level: Not on file  Occupational History   Not on file  Social Needs   Financial resource strain: Not  on file   Food insecurity    Worry: Not on file    Inability: Not on file   Transportation needs    Medical: Not on file    Non-medical: Not on file  Tobacco Use   Smoking status: Current Every Day Smoker    Packs/day: 0.50    Types: Cigarettes   Smokeless tobacco: Never Used  Substance and Sexual Activity   Alcohol use: Not Currently   Drug use: Yes    Types: Marijuana    Comment: Several times per week   Sexual activity: Yes  Lifestyle   Physical activity    Days per week: Not on file    Minutes per session: Not on file   Stress: Not on file  Relationships   Social connections    Talks on phone: Not on file    Gets together: Not on file    Attends religious service: Not on file    Active member of club or organization: Not on  file    Attends meetings of clubs or organizations: Not on file    Relationship status: Not on file   Intimate partner violence    Fear of current or ex partner: Not on file    Emotionally abused: Not on file    Physically abused: Not on file    Forced sexual activity: Not on file  Other Topics Concern   Not on file  Social History Narrative   Not on file    Outpatient Medications Prior to Visit  Medication Sig Dispense Refill   ACCU-CHEK FASTCLIX LANCETS MISC Use to check blood sugar up to 5 times daily. 102 each 11   atorvastatin (LIPITOR) 40 MG tablet Take 1 tablet (40 mg total) by mouth at bedtime. 90 tablet 1   baclofen (LIORESAL) 10 MG tablet Take 1 tablet (10 mg total) by mouth 3 (three) times daily as needed for muscle spasms. 90 tablet 1   Blood Glucose Monitoring Suppl (ACCU-CHEK GUIDE) w/Device KIT 1 kit by Does not apply route 5 (five) times daily. 1 kit 0   DULoxetine (CYMBALTA) 60 MG capsule Take 1 capsule (60 mg total) by mouth daily. 30 capsule 1   gabapentin (NEURONTIN) 300 MG capsule Take 1 capsule (300 mg total) by mouth at bedtime. To help with pain in feet/hands; may increase to twice daily after 7 days 60 capsule 3   glucose blood (ACCU-CHEK GUIDE) test strip Use as instructed to check blood sugar up to 5 times daily. 100 each 11   ibuprofen (ADVIL,MOTRIN) 600 MG tablet Take 600 mg by mouth every 6 (six) hours as needed.     Insulin Detemir (LEVEMIR FLEXTOUCH) 100 UNIT/ML Pen Inject into the skin.     Insulin Pen Needle (FIFTY50 PEN NEEDLES) 31G X 5 MM MISC Use as directed     INSULIN SYRINGE .5CC/29G (B-D INSULIN SYRINGE) 29G X 1/2" 0.5 ML MISC Use to inject insulin every day. 100 each 11   levothyroxine (SYNTHROID, LEVOTHROID) 175 MCG tablet Take 1 tablet (175 mcg total) by mouth daily before breakfast. 30 tablet 5   lisinopril (PRINIVIL,ZESTRIL) 5 MG tablet Take 1 tablet (5 mg total) by mouth daily. 1/2 TABLET daily 45 tablet 3   methocarbamol  (ROBAXIN) 500 MG tablet Take 2 tablets (1,000 mg total) by mouth 3 (three) times daily. As needed for muscle spasm 90 tablet 0   NOVOLOG FLEXPEN 100 UNIT/ML FlexPen INJECT 15 UNITS SUBCUTANEOUSLY 3TIMES DAILY W/MEALS. ADD SLIDING SCALE IF SUGAR  100. MAX 75UNITS/DAY     rosuvastatin (CRESTOR) 40 MG tablet Take 40 mg by mouth daily.     traMADol (ULTRAM) 50 MG tablet Take 50 mg by mouth every 8 (eight) hours as needed.     venlafaxine XR (EFFEXOR-XR) 75 MG 24 hr capsule TAKE 1 CAPSULE (75 MG TOTAL) BY MOUTH DAILY WITH BREAKFAST. 30 capsule 2   No facility-administered medications prior to visit.     No Known Allergies  ROS Review of Systems  Constitutional: Positive for fatigue. Negative for chills and fever.  HENT: Negative for sore throat and trouble swallowing.   Eyes: Positive for visual disturbance. Negative for photophobia.       Wears glasses  Respiratory: Negative for cough and shortness of breath.   Cardiovascular: Negative for chest pain, palpitations and leg swelling.  Gastrointestinal: Negative for abdominal pain, constipation, diarrhea and nausea.  Endocrine: Positive for polydipsia, polyphagia and polyuria. Negative for cold intolerance and heat intolerance.  Genitourinary: Positive for frequency (When blood sugars are higher). Negative for dysuria.  Musculoskeletal: Positive for arthralgias and back pain.  Skin: Negative for rash and wound.  Neurological: Positive for numbness. Negative for dizziness and headaches.  Hematological: Negative for adenopathy. Does not bruise/bleed easily.  Psychiatric/Behavioral: Positive for sleep disturbance. Negative for self-injury and suicidal ideas. The patient is nervous/anxious.       Objective:    Physical Exam  Constitutional: He appears well-developed and well-nourished.  Overweight for height older male in no acute distress  Neck: Normal range of motion. Neck supple. No JVD present. No thyromegaly present.  Cardiovascular:  Normal rate, regular rhythm and intact distal pulses.  Pulmonary/Chest: Effort normal and breath sounds normal.  Abdominal: Soft. There is no abdominal tenderness. There is no rebound and no guarding.  Musculoskeletal:        General: Tenderness present. No edema.     Comments: Patient with L3-4 through L5-S1 discomfort with palpation and mild thoracolumbar paraspinous spasm.  No CVA tenderness  Lymphadenopathy:    He has no cervical adenopathy.  Skin: Skin is warm and dry.  No active skin breakdown on the feet.  Good nail care.  Psychiatric: He has a normal mood and affect. His behavior is normal.  Nursing note and vitals reviewed.   BP 122/72    Pulse 62    Ht _0  (1.778 m)    Wt 212 lb 12.8 oz (96.5 kg)    BMI 30.53 kg/m  Wt Readings from Last 3 Encounters:  09/11/18 212 lb 12.8 oz (96.5 kg)  07/18/18 217 lb 8 oz (98.7 kg)  06/10/18 218 lb 14.4 oz (99.3 kg)     Health Maintenance Due  Topic Date Due   FOOT EXAM  09/20/1983   HIV Screening  09/19/1988   HEMOGLOBIN A1C  08/12/2018   INFLUENZA VACCINE  08/24/2018    Lab Results  Component Value Date   TSH 124.600 (H) 02/11/2018   Lab Results  Component Value Date   WBC 13.6 (H) 04/08/2018   HGB 15.4 04/08/2018   HCT 45.5 04/08/2018   MCV 94 04/08/2018   PLT 356 04/08/2018   Lab Results  Component Value Date   NA 135 04/08/2018   K 4.3 04/08/2018   CO2 23 04/08/2018   GLUCOSE 218 (H) 04/08/2018   BUN 6 04/08/2018   CREATININE 1.38 (H) 04/08/2018   BILITOT 0.5 02/11/2018   ALKPHOS 55 02/11/2018   AST 31 02/11/2018   ALT 17 02/11/2018  PROT 6.6 02/11/2018   ALBUMIN 4.2 02/11/2018   CALCIUM 8.9 04/08/2018   ANIONGAP 9 04/01/2018   Lab Results  Component Value Date   CHOL 298 (H) 03/04/2018   Lab Results  Component Value Date   HDL 59 03/04/2018   Lab Results  Component Value Date   LDLCALC 214 (H) 03/04/2018   Lab Results  Component Value Date   TRIG 124 03/04/2018   Lab Results    Component Value Date   CHOLHDL 5.1 (H) 03/04/2018   Lab Results  Component Value Date   HGBA1C 9.6 (H) 02/11/2018      Assessment & Plan:  1. Uncontrolled type 1 diabetes mellitus with hyperglycemia ([place]); diabetic peripheral neuropathy associated with type 1 diabetes Patient reports an upcoming visit with his endocrinologist, Dr. Jenetta DownerLelon Huh, patient will have blood work done at today's visit including CMP, lipid panel, hemoglobin A1c in follow-up of his diabetes so that the results will be available for his endocrinologist.  He is encouraged to continue efforts at being compliant with diabetic diet, exercise as tolerated and medication changes as per endocrinology.  Reminded patient of need for yearly diabetic eye exam.  Patient appears to have good foot care at today's visit.  Continue daily foot inspection due to neuropathy and continue use of gabapentin to help with neuropathic pain.  Referral to neurology in follow-up of memory difficulty.  This could be related to patient's hyperglycemia and patient may have some issues with microvascular disease due to his uncontrolled diabetes.  We will also check TSH in follow-up of his hypothyroidism as this can cause some issues with memory/depression if not well controlled.  Influenza immunization offered but declined by patient at today's visit. -Comprehensive metabolic panel - Lipid panel - Hemoglobin A1c - Ambulatory referral to Neurology   3. Hyperlipidemia LDL goal <70 Currently on atorvastatin.  Will check lipid panel and additionally will check electrolytes/liver enzymes in follow-up of long-term use of statin medication. - Lipid panel  4. Hypothyroidism, unspecified type TSH in follow-up of hypothyroidism as well as memory issues.  Patient has upcoming endocrinology appointment and should make sure that his endocrinologist is aware of the results. - TSH  5. Encounter for long-term (current) use of medications Patient will have  complete metabolic panel in follow-up of long-term use of medications including insulin, lisinopril and statin medication.  Patient has also been on pain medication such as ibuprofen which can affect creatinine. -Comprehensive metabolic panel  6. Leukocytosis, unspecified type Patient has had history of leukocytosis which has been trending down towards normal.  Will recheck CBC in follow-up - CBC with Differential  7. Memory difficulty Patient with report of increasing difficulty with his memory.  He will be referred to neurology for further evaluation and treatment but also discussed with patient that his poorly controlled blood sugars as well as his hypothyroidism can also contribute - Ambulatory referral to Neurology  8. Chronic midline low back pain without sciatica Patient will be referred to orthopedics for further evaluation and treatment of his chronic low back pain - AMB referral to orthopedics  An After Visit Summary was printed and given to the patient.   Follow-up: Return in about 4 months (around 01/11/2019).    [name], MD

## 2018-09-11 NOTE — Patient Instructions (Signed)
Chronic Back Pain When back pain lasts longer than 3 months, it is called chronic back pain. Pain may get worse at certain times (flare-ups). There are things you can do at home to manage your pain. Follow these instructions at home: Activity      Avoid bending and other activities that make pain worse.  When standing: ? Keep your upper back and neck straight. ? Keep your shoulders pulled back. ? Avoid slouching.  When sitting: ? Keep your back straight. ? Relax your shoulders. Do not round your shoulders or pull them backward.  Do not sit or stand in one place for long periods of time.  Take short rest breaks during the day. Lying down or standing is usually better than sitting. Resting can help relieve pain.  When sitting or lying down for a long time, do some mild activity or stretching. This will help to prevent stiffness and pain.  Get regular exercise. Ask your doctor what activities are safe for you.  Do not lift anything that is heavier than 10 lb (4.5 kg). To prevent injury when you lift things: ? Bend your knees. ? Keep the weight close to your body. ? Avoid twisting. Managing pain  If told, put ice on the painful area. Your doctor may tell you to use ice for 24-48 hours after a flare-up starts. ? Put ice in a plastic bag. ? Place a towel between your skin and the bag. ? Leave the ice on for 20 minutes, 2-3 times a day.  If told, put heat on the painful area as often as told by your doctor. Use the heat source that your doctor recommends, such as a moist heat pack or a heating pad. ? Place a towel between your skin and the heat source. ? Leave the heat on for 20-30 minutes. ? Remove the heat if your skin turns bright red. This is especially important if you are unable to feel pain, heat, or cold. You may have a greater risk of getting burned.  Soak in a warm bath. This can help relieve pain.  Take over-the-counter and prescription medicines only as told by your  doctor. General instructions  Sleep on a firm mattress. Try lying on your side with your knees slightly bent. If you lie on your back, put a pillow under your knees.  Keep all follow-up visits as told by your doctor. This is important. Contact a doctor if:  You have pain that does not get better with rest or medicine. Get help right away if:  One or both of your arms or legs feel weak.  One or both of your arms or legs lose feeling (numbness).  You have trouble controlling when you poop (bowel movement) or pee (urinate).  You feel sick to your stomach (nauseous).  You throw up (vomit).  You have belly (abdominal) pain.  You have shortness of breath.  You pass out (faint). Summary  When back pain lasts longer than 3 months, it is called chronic back pain.  Pain may get worse at certain times (flare-ups).  Use ice and heat as told by your doctor. Your doctor may tell you to use ice after flare-ups. This information is not intended to replace advice given to you by your health care provider. Make sure you discuss any questions you have with your health care provider. Document Released: 06/28/2007 Document Revised: 05/02/2018 Document Reviewed: 08/24/2016 Elsevier Patient Education  [place].  Dyslipidemia Dyslipidemia is an imbalance of waxy,  fat-like substances (lipids) in the blood. The body needs lipids in small amounts. Dyslipidemia often involves a high level of cholesterol or triglycerides, which are types of lipids. Common forms of dyslipidemia include:  High levels of LDL cholesterol. LDL is the type of cholesterol that causes fatty deposits (plaques) to build up in the blood vessels that carry blood away from your heart (arteries).  Low levels of HDL cholesterol. HDL cholesterol is the type of cholesterol that protects against heart disease. High levels of HDL remove the LDL buildup from arteries.  High levels of triglycerides. Triglycerides are a fatty  substance in the blood that is linked to a buildup of plaques in the arteries. What are the causes? Primary dyslipidemia is caused by changes (mutations) in genes that are passed down through families (inherited). These mutations cause several types of dyslipidemia. Secondary dyslipidemia is caused by lifestyle choices and diseases that lead to dyslipidemia, such as:  Eating a diet that is high in animal fat.  Not getting enough exercise.  Having diabetes, kidney disease, liver disease, or thyroid disease.  Drinking large amounts of alcohol.  Using certain medicines. What increases the risk? You are more likely to develop this condition if you are an older man or if you are a woman who has gone through menopause. Other risk factors include:  Having a family history of dyslipidemia.  Taking certain medicines, including birth control pills, steroids, some diuretics, and beta-blockers.  Smoking cigarettes.  Eating a high-fat diet.  Having certain medical conditions such as diabetes, polycystic ovary syndrome (PCOS), kidney disease, liver disease, or hypothyroidism.  Not exercising regularly.  Being overweight or obese with too much belly fat. What are the signs or symptoms? In most cases, dyslipidemia does not usually cause any symptoms. In severe cases, very high lipid levels can cause:  Fatty bumps under the skin (xanthomas).  White or gray ring around the black center (pupil) of the eye. Very high triglyceride levels can cause inflammation of the pancreas (pancreatitis). How is this diagnosed? Your health care provider may diagnose dyslipidemia based on a routine blood test (fasting blood test). Because most people do not have symptoms of the condition, this blood testing (lipid profile) is done on adults age 45 and older and is repeated every 5 years. This test checks:  Total cholesterol. This measures the total amount of cholesterol in your blood, including LDL cholesterol,  HDL cholesterol, and triglycerides. A healthy number is below 200.  LDL cholesterol. The target number for LDL cholesterol is different for each person, depending on individual risk factors. Ask your health care provider what your LDL cholesterol should be.  HDL cholesterol. An HDL level of 60 or higher is best because it helps to protect against heart disease. A number below 70 for men or below 96 for women increases the risk for heart disease.  Triglycerides. A healthy triglyceride number is below 150. If your lipid profile is abnormal, your health care provider may do other blood tests. How is this treated? Treatment depends on the type of dyslipidemia that you have and your other risk factors for heart disease and stroke. Your health care provider will have a target range for your lipid levels based on this information. For many people, this condition may be treated by lifestyle changes, such as diet and exercise. Your health care provider may recommend that you:  Get regular exercise.  Make changes to your diet.  Quit smoking if you smoke. If diet changes and exercise  do not help you reach your goals, your health care provider may also prescribe medicine to lower lipids. The most commonly prescribed type of medicine lowers your LDL cholesterol (statin drug). If you have a high triglyceride level, your provider may prescribe another type of drug (fibrate) or an omega-3 fish oil supplement, or both. Follow these instructions at home:  Eating and drinking  Follow instructions from your health care provider or dietitian about eating or drinking restrictions.  Eat a healthy diet as told by your health care provider. This can help you reach and maintain a healthy weight, lower your LDL cholesterol, and raise your HDL cholesterol. This may include: ? Limiting your calories, if you are overweight. ? Eating more fruits, vegetables, whole grains, fish, and lean meats. ? Limiting saturated fat,  trans fat, and cholesterol.  If you drink alcohol: ? Limit how much you use. ? Be aware of how much alcohol is in your drink. In the [place] one drink equals one 12 oz bottle of beer (355 mL), one 5 oz glass of wine (148 mL), or one 1 oz glass of hard liquor (44 mL).  Do not drink alcohol if: ? Your health care provider tells you not to drink. ? You are pregnant, may be pregnant, or are planning to become pregnant. Activity  Get regular exercise. Start an exercise and strength training program as told by your health care provider. Ask your health care provider what activities are safe for you. Your health care provider may recommend: ? 30 minutes of aerobic activity 4-6 days a week. Brisk walking is an example of aerobic activity. ? Strength training 2 days a week. General instructions  Do not use any products that contain nicotine or tobacco, such as cigarettes, e-cigarettes, and chewing tobacco. If you need help quitting, ask your health care provider.  Take over-the-counter and prescription medicines only as told by your health care provider. This includes supplements.  Keep all follow-up visits as told by your health care provider. Contact a health care provider if:  You are: ? Having trouble sticking to your exercise or diet plan. ? Struggling to quit smoking or control your use of alcohol. Summary  Dyslipidemia often involves a high level of cholesterol or triglycerides, which are types of lipids.  Treatment depends on the type of dyslipidemia that you have and your other risk factors for heart disease and stroke.  For many people, treatment starts with lifestyle changes, such as diet and exercise.  Your health care provider may prescribe medicine to lower lipids. This information is not intended to replace advice given to you by your health care provider. Make sure you discuss any questions you have with your health care provider. Document Released: 01/14/2013 Document  Revised: 09/03/2017 Document Reviewed: 08/10/2017 Elsevier Patient Education  [place].

## 2018-09-12 LAB — COMPREHENSIVE METABOLIC PANEL WITH GFR
ALT: 15 IU/L (ref 0–44)
AST: 24 IU/L (ref 0–40)
Albumin/Globulin Ratio: 1.9 (ref 1.2–2.2)
Albumin: 4.2 g/dL (ref 4.0–5.0)
Alkaline Phosphatase: 70 IU/L (ref 39–117)
BUN/Creatinine Ratio: 6 — ABNORMAL LOW (ref 9–20)
BUN: 7 mg/dL (ref 6–24)
Bilirubin Total: 0.4 mg/dL (ref 0.0–1.2)
CO2: 26 mmol/L (ref 20–29)
Calcium: 8.9 mg/dL (ref 8.7–10.2)
Chloride: 94 mmol/L — ABNORMAL LOW (ref 96–106)
Creatinine, Ser: 1.2 mg/dL (ref 0.76–1.27)
GFR calc Af Amer: 85 mL/min/1.73
GFR calc non Af Amer: 73 mL/min/1.73
Globulin, Total: 2.2 g/dL (ref 1.5–4.5)
Glucose: 364 mg/dL — ABNORMAL HIGH (ref 65–99)
Potassium: 4.8 mmol/L (ref 3.5–5.2)
Sodium: 134 mmol/L (ref 134–144)
Total Protein: 6.4 g/dL (ref 6.0–8.5)

## 2018-09-12 LAB — LIPID PANEL
Chol/HDL Ratio: 3.5 ratio (ref 0.0–5.0)
Cholesterol, Total: 226 mg/dL — ABNORMAL HIGH (ref 100–199)
HDL: 65 mg/dL
LDL Calculated: 143 mg/dL — ABNORMAL HIGH (ref 0–99)
Triglycerides: 89 mg/dL (ref 0–149)
VLDL Cholesterol Cal: 18 mg/dL (ref 5–40)

## 2018-09-12 LAB — TSH: TSH: 64.6 u[IU]/mL — ABNORMAL HIGH (ref 0.450–4.500)

## 2018-09-12 LAB — CBC WITH DIFFERENTIAL/PLATELET
Basophils Absolute: 0.1 x10E3/uL (ref 0.0–0.2)
Basos: 1 %
EOS (ABSOLUTE): 0.2 x10E3/uL (ref 0.0–0.4)
Eos: 2 %
Hematocrit: 46.8 % (ref 37.5–51.0)
Hemoglobin: 16.1 g/dL (ref 13.0–17.7)
Immature Grans (Abs): 0 x10E3/uL (ref 0.0–0.1)
Immature Granulocytes: 0 %
Lymphocytes Absolute: 2.5 x10E3/uL (ref 0.7–3.1)
Lymphs: 24 %
MCH: 32.5 pg (ref 26.6–33.0)
MCHC: 34.4 g/dL (ref 31.5–35.7)
MCV: 95 fL (ref 79–97)
Monocytes Absolute: 0.5 x10E3/uL (ref 0.1–0.9)
Monocytes: 5 %
Neutrophils Absolute: 7 x10E3/uL (ref 1.4–7.0)
Neutrophils: 68 %
Platelets: 284 x10E3/uL (ref 150–450)
RBC: 4.95 x10E6/uL (ref 4.14–5.80)
RDW: 12.5 % (ref 11.6–15.4)
WBC: 10.4 x10E3/uL (ref 3.4–10.8)

## 2018-09-12 LAB — HEMOGLOBIN A1C
Est. average glucose Bld gHb Est-mCnc: 200 mg/dL
Hgb A1c MFr Bld: 8.6 % — ABNORMAL HIGH (ref 4.8–5.6)

## 2018-09-26 ENCOUNTER — Other Ambulatory Visit: Payer: Self-pay | Admitting: Family Medicine

## 2018-09-26 DIAGNOSIS — E1042 Type 1 diabetes mellitus with diabetic polyneuropathy: Secondary | ICD-10-CM

## 2018-10-09 ENCOUNTER — Encounter: Payer: Self-pay | Admitting: Orthopedic Surgery

## 2018-10-09 ENCOUNTER — Ambulatory Visit (INDEPENDENT_AMBULATORY_CARE_PROVIDER_SITE_OTHER): Payer: Medicaid Other | Admitting: Orthopedic Surgery

## 2018-10-09 DIAGNOSIS — M5416 Radiculopathy, lumbar region: Secondary | ICD-10-CM | POA: Diagnosis not present

## 2018-10-09 MED ORDER — TRAMADOL HCL 50 MG PO TABS: ORAL_TABLET | ORAL | 0 refills | Status: DC

## 2018-10-09 MED ORDER — METHOCARBAMOL 500 MG PO TABS: 500.0000 mg | ORAL_TABLET | Freq: Three times a day (TID) | ORAL | 0 refills | Status: DC | PRN

## 2018-10-10 ENCOUNTER — Ambulatory Visit: Payer: Medicaid Other | Admitting: Orthopedic Surgery

## 2018-10-11 ENCOUNTER — Encounter: Payer: Self-pay | Admitting: Orthopedic Surgery

## 2018-10-11 NOTE — Progress Notes (Signed)
Office Visit Note   Patient: Ronald Banks           Date of Birth: 1975-09-29           MRN: [id number] Visit Date: 10/09/2018 Requested by: [name], MD [place],  [place] [address] PCP: [name], MD  Subjective: Chief Complaint  Patient presents with  . Lower Back - Pain    HPI: Ronald Banks is a 43 y.o. male who presents to the office complaining of back pain.  Patient notes pain that began in January 2020.  He was seen by Dr. Junius Roads who recommended patient try physical therapy.  Patient tried physical therapy without much relief of pain.  He reports significant lumbar back pain with new onset radicular left leg pain and numbness and tingling in the left foot in the past month.  He has never had such radicular pain before.  His leg symptoms are worse with walking and he states that flexion of the spine helps relieve his pain.  He works as a Development worker, international aid.  He has never had an MRI of the L-spine, surgery on his back, ESI's before.  He has tried Tylenol Motrin without any relief.              ROS:  All systems reviewed are negative as they relate to the chief complaint within the history of present illness.  Patient denies fevers or chills.  Assessment & Plan: Visit Diagnoses:  1. Radiculopathy, lumbar region     Plan: Patient is a 43 year old male who presents with 9 months of lumbar spine pain and 1 month of radicular left leg symptoms.  He has tried physical therapy without any relief.  Impression is herniated disc versus spinal stenosis of the lumbar spine.  He notes weakness but there is no weakness evident on exam.  X-rays reviewed from the previous visit with Dr. Junius Roads, no findings other than mild degenerative changes.  Ordered MRI of the L-spine to evaluate for disc pathology.  Prescribed Robaxin and tramadol for symptom relief in the meantime.  Patient will follow-up with the office after MRI to review results.  Patient is agreeable to this plan.  Follow-Up  Instructions: No follow-ups on file.   Orders:  Orders Placed This Encounter  Procedures  . MR Lumbar Spine w/o contrast   Meds ordered this encounter  Medications  . methocarbamol (ROBAXIN) 500 MG tablet    Sig: Take 1 tablet (500 mg total) by mouth every 8 (eight) hours as needed for muscle spasms.    Dispense:  30 tablet    Refill:  0  . traMADol (ULTRAM) 50 MG tablet    Sig: 1 po q hs prn pain    Dispense:  30 tablet    Refill:  0      Procedures: No procedures performed   Clinical Data: No additional findings.  Objective: Vital Signs: There were no vitals taken for this visit.  Physical Exam:  Constitutional: Patient appears well-developed HEENT:  Head: Normocephalic Eyes:EOM are normal Neck: Normal range of motion Cardiovascular: Normal rate Pulmonary/chest: Effort normal Neurologic: Patient is alert Skin: Skin is warm Psychiatric: Patient has normal mood and affect  Ortho Exam:  Back exam TTP over the axial lumbar spine and paraspinal musculature Normal reflexes 5/5 motor strength of the hip flexors, quadriceps, dorsiflexion, plantarflexion Positive straight leg raise on the left Reduced sensation of the medial and lateral left foot   Specialty Comments:  No specialty comments available.  Imaging: No results found.   PMFS History: Patient Active Problem List   Diagnosis Date Noted  . Diabetic peripheral neuropathy associated with type 1 diabetes mellitus ([place]) 02/11/2018  . Hyperlipidemia LDL goal <70 02/11/2018  . Hypothyroidism 02/11/2018  . Chronic right shoulder pain 02/11/2018  . Chronic midline low back pain with right-sided sciatica 02/11/2018  . Anxiety 02/11/2018   Past Medical History:  Diagnosis Date  . Diabetes mellitus without complication ([place])    type 1   . Hyperlipidemia   . Thyroid disease     Family History  Problem Relation Age of Onset  . Diabetes Mother   . Cancer Mother   . Diabetes Sister   . Diabetes  Maternal Grandmother     Past Surgical History:  Procedure Laterality Date  . NO PAST SURGERIES     Social History   Occupational History  . Not on file  Tobacco Use  . Smoking status: Current Every Day Smoker    Packs/day: 0.50    Types: Cigarettes  . Smokeless tobacco: Never Used  Substance and Sexual Activity  . Alcohol use: Not Currently  . Drug use: Yes    Types: Marijuana    Comment: Several times per week  . Sexual activity: Yes

## 2018-10-23 ENCOUNTER — Ambulatory Visit: Payer: Medicaid Other | Admitting: Orthopedic Surgery

## 2018-10-30 ENCOUNTER — Telehealth: Payer: Self-pay | Admitting: Orthopedic Surgery

## 2018-10-30 NOTE — Telephone Encounter (Signed)
Patient called stating that he received a letter from [facility] advising that his MRI is not being covered by his Medicaid.  He wanted to know what he needs to do now.  CB#[phone].  Thank you.

## 2018-10-31 ENCOUNTER — Encounter: Payer: Self-pay | Admitting: Neurology

## 2018-10-31 ENCOUNTER — Other Ambulatory Visit: Payer: Self-pay

## 2018-10-31 ENCOUNTER — Ambulatory Visit: Payer: Medicaid Other | Admitting: Neurology

## 2018-10-31 VITALS — BP 122/68 | HR 59 | Temp 97.5°F | Ht 70.0 in | Wt 201.3 lb

## 2018-10-31 DIAGNOSIS — R413 Other amnesia: Secondary | ICD-10-CM

## 2018-10-31 DIAGNOSIS — E538 Deficiency of other specified B group vitamins: Secondary | ICD-10-CM

## 2018-10-31 DIAGNOSIS — E1042 Type 1 diabetes mellitus with diabetic polyneuropathy: Secondary | ICD-10-CM

## 2018-10-31 NOTE — Progress Notes (Signed)
Reason for visit: Numbness, sciatica  Referring physician: Dr. [name] is a 43 y.o. male  History of present illness:  [name] is a 43 year old right-handed white male with a history of type 1 diabetes that has not been in excellent control.  The patient comes to the office today with a 35-monthhistory of left hand numbness, he denies numbness on the right.  He has occasionally has dropped things out of the hand.  He denies any neck pain or pain down the left arm.  Over the last 2-1/2 months, the patient has developed sciatica down the left leg, within the last 3 weeks he has reported numbness on the left foot and left lower leg below the knee.  He is having difficulty with walking, he may stumble on occasion but he has not had any falls.  He denies issues controlling the bowels or the bladder.  He denies any numbness of the right foot or any pain down the right leg.  He also reports some mild memory changes over the last 8 months, he does report occasional episodes of hypoglycemia down as low as around 45.  These episodes may occur once every 2 or 3 weeks.  Given the above symptoms, the patient was sent to this office for an evaluation.  Past Medical History:  Diagnosis Date  . Diabetes mellitus without complication ([place]    type 1   . Hyperlipidemia   . Thyroid disease     Past Surgical History:  Procedure Laterality Date  . NO PAST SURGERIES      Family History  Problem Relation Age of Onset  . Diabetes Mother   . Cancer Mother        Brain, breast   . Diabetes Sister   . Diabetes Maternal Grandmother     Social history:  reports that he has been smoking cigarettes. He has been smoking about 0.50 packs per day. He has never used smokeless tobacco. He reports previous alcohol use. He reports current drug use. Drug: Marijuana.  Medications:  Prior to Admission medications   Medication Sig Start Date End Date Taking? Authorizing Provider  ACCU-CHEK FASTCLIX  LANCETS MISC Use to check blood sugar up to 5 times daily. 03/05/18  Yes Fulp, Cammie, MD  atorvastatin (LIPITOR) 40 MG tablet Take 1 tablet (40 mg total) by mouth at bedtime. 03/07/18  Yes Fulp, Cammie, MD  baclofen (LIORESAL) 10 MG tablet Take 1 tablet (10 mg total) by mouth 3 (three) times daily as needed for muscle spasms. 02/19/18  Yes Hilts, MLegrand Como MD  Blood Glucose Monitoring Suppl (ACCU-CHEK GUIDE) w/Device KIT 1 kit by Does not apply route 5 (five) times daily. 03/05/18  Yes Fulp, Cammie, MD  DULoxetine (CYMBALTA) 60 MG capsule Take 1 capsule (60 mg total) by mouth daily. 02/11/18  Yes Fulp, Cammie, MD  gabapentin (NEURONTIN) 300 MG capsule Take 1 capsule (300 mg total) by mouth at bedtime. To help with pain in feet/hands; may increase to twice daily after 7 days 02/11/18  Yes Fulp, Cammie, MD  glucose blood (ACCU-CHEK GUIDE) test strip Use as instructed to check blood sugar up to 5 times daily. 03/05/18  Yes Fulp, Cammie, MD  ibuprofen (ADVIL,MOTRIN) 600 MG tablet Take 600 mg by mouth every 6 (six) hours as needed.   Yes [provider]  Insulin Detemir (LEVEMIR FLEXTOUCH) 100 UNIT/ML Pen Inject into the skin. 04/18/18 04/18/19 Yes [provider]  Insulin Pen Needle (FIFTY50 PEN NEEDLES) 31G  X 5 MM MISC Use as directed 05/31/18 05/31/19 Yes [provider]  INSULIN SYRINGE .5CC/29G (B-D INSULIN SYRINGE) 29G X 1/2" 0.5 ML MISC Use to inject insulin every day. 04/18/18  Yes Fulp, Cammie, MD  levothyroxine (SYNTHROID, LEVOTHROID) 175 MCG tablet Take 1 tablet (175 mcg total) by mouth daily before breakfast. 02/11/18  Yes Fulp, Cammie, MD  lisinopril (PRINIVIL,ZESTRIL) 5 MG tablet Take 1 tablet (5 mg total) by mouth daily. 1/2 TABLET daily 02/11/18  Yes Fulp, Cammie, MD  methocarbamol (ROBAXIN) 500 MG tablet Take 2 tablets (1,000 mg total) by mouth 3 (three) times daily. As needed for muscle spasm 02/11/18  Yes Fulp, Cammie, MD  methocarbamol (ROBAXIN) 500 MG tablet Take 1 tablet (500  mg total) by mouth every 8 (eight) hours as needed for muscle spasms. 10/09/18  Yes Meredith Pel, MD  NOVOLOG FLEXPEN 100 UNIT/ML FlexPen INJECT 15 UNITS SUBCUTANEOUSLY 3TIMES DAILY W/MEALS. ADD SLIDING SCALE IF SUGAR 100. MAX 75UNITS/DAY 05/18/18  Yes [provider]  rosuvastatin (CRESTOR) 40 MG tablet Take 40 mg by mouth daily. 06/30/18  Yes [provider]  traMADol (ULTRAM) 50 MG tablet Take 50 mg by mouth every 8 (eight) hours as needed.   Yes [provider]  traMADol (ULTRAM) 50 MG tablet 1 po q hs prn pain 10/09/18  Yes Meredith Pel, MD  venlafaxine XR (EFFEXOR-XR) 75 MG 24 hr capsule TAKE 1 CAPSULE (75 MG TOTAL) BY MOUTH DAILY WITH BREAKFAST. 06/18/18  Yes Fulp, Cammie, MD     No Known Allergies  ROS:  Out of a complete 14 system review of symptoms, the patient complains only of the following symptoms, and all other reviewed systems are negative.  Numbness, back pain Left leg pain  Blood pressure 122/68, pulse (!) 59, temperature (!) 97.5 F (36.4 C), temperature source Temporal, height _0  (1.778 m), weight 201 lb 5 oz (91.3 kg).  Physical Exam  General: The patient is alert and cooperative at the time of the examination.  Eyes: Pupils are equal, round, and reactive to light. Discs are flat bilaterally.  Neck: The neck is supple, no carotid bruits are noted.  Respiratory: The respiratory examination is clear.  Cardiovascular: The cardiovascular examination reveals a regular rate and rhythm, no obvious murmurs or rubs are noted.  Neuromuscular: The patient is able to demonstrate full flexion of the low back, he is able to walk on heels and the toes bilaterally.  Skin: Extremities are without significant edema.  Neurologic Exam  Mental status: The patient is alert and oriented x 3 at the time of the examination. The patient has apparent normal recent and remote memory, with an apparently normal attention span and concentration  ability.  Cranial nerves: Facial symmetry is present. There is good sensation of the face to pinprick and soft touch bilaterally. The strength of the facial muscles and the muscles to head turning and shoulder shrug are normal bilaterally. Speech is well enunciated, no aphasia or dysarthria is noted. Extraocular movements are full. Visual fields are full. The tongue is midline, and the patient has symmetric elevation of the soft palate. No obvious hearing deficits are noted.  Motor: The motor testing reveals 5 over 5 strength of all 4 extremities, with exception of some slight weakness of the intrinsic muscles of the left hand and with APB muscle weakness on the right greater than left hand. Good symmetric motor tone is noted throughout.  Sensory: Sensory testing is intact to pinprick, soft touch, vibration  sensation, and position sense on the upper extremities.  With the lower extremities, there is a stocking pattern pinprick sensory deficit up to the knees bilaterally, pinprick sensation is more prominently reduced diffusely below the knee on the left, vibration sensation and position sensation are decreased on the left foot.  No evidence of extinction is noted.  Coordination: Cerebellar testing reveals good finger-nose-finger and heel-to-shin bilaterally.  Gait and station: Gait is normal. Tandem gait is normal. Romberg is negative. No drift is seen.  Reflexes: Deep tendon reflexes are symmetric, but are depressed bilaterally. Toes are downgoing bilaterally.   Assessment/Plan:  1.  Numbness, left hand  2.  Low back pain, left-sided sciatica  3.  Reported mild memory disturbance  The patient will be set up for blood work today.  He will have nerve conductions on both arms and the left leg and EMG on the left leg.  Depending upon the results of the above evaluation, further studies such as MRI of the lumbar spine may be done.  The patient is already on Cymbalta and gabapentin for discomfort  without much benefit.  He will follow-up otherwise in 4 months.  The patient likely has bilateral carpal tunnel syndrome on clinical examination.  Jill Alexanders MD 10/31/2018 9:34 AM  [facility] [address] [place], [place] [address]  Phone [phone] Fax [phone]

## 2018-11-02 ENCOUNTER — Other Ambulatory Visit: Payer: Self-pay

## 2018-11-02 ENCOUNTER — Ambulatory Visit
Admission: RE | Admit: 2018-11-02 | Discharge: 2018-11-02 | Disposition: A | Discharge status: Home / Self Care | Payer: Medicaid Other | Source: Ambulatory Visit | Attending: Orthopedic Surgery | Admitting: Orthopedic Surgery

## 2018-11-02 DIAGNOSIS — M5416 Radiculopathy, lumbar region: Secondary | ICD-10-CM

## 2018-11-02 IMAGING — MR MR LUMBAR SPINE W/O CM
4 of 5 series · 27 of 48 positions shown · non-contrast
Comparison: Radiography [DATE]

CLINICAL DATA: Low back pain radiating to the left leg over the
last 7 months.

EXAM:
MRI LUMBAR SPINE WITHOUT CONTRAST
TECHNIQUE: Multiplanar, multisequence MR imaging of the lumbar spine was
performed. No intravenous contrast was administered.

[Series 3: T1 · sagittal · 4.0mm · 1.09mm/px · 5 of 16 slices shown (1 of 2)]
[im 1/16]
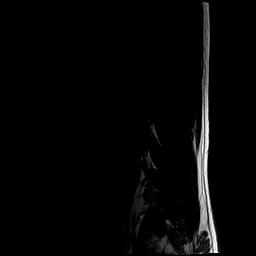
[im 4/16]
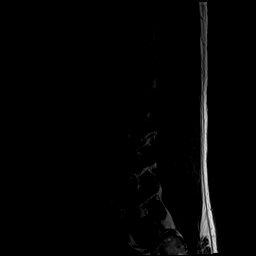
[im 8/16]
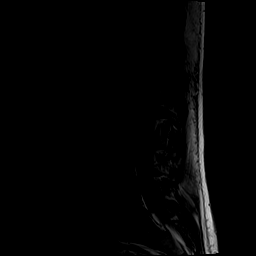
[im 12/16]
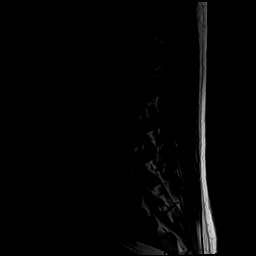
[im 16/16]
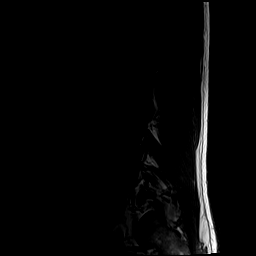

[Series 5: T2 · sagittal · 4.0mm · 1.09mm/px · 6 of 16 slices shown (1 of 2)]
[im 1/16]
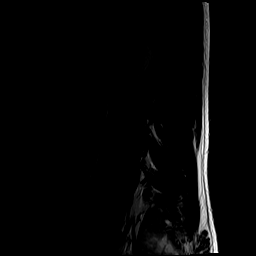
[im 4/16]
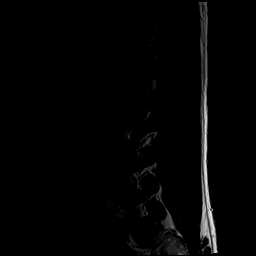
[im 7/16]
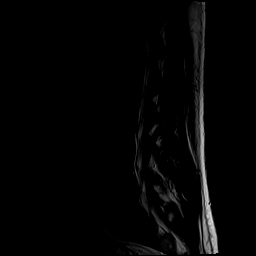
[im 10/16]
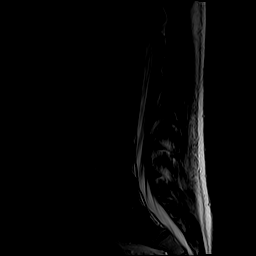
[im 13/16]
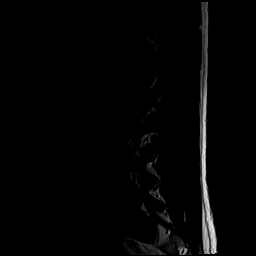
[im 16/16]
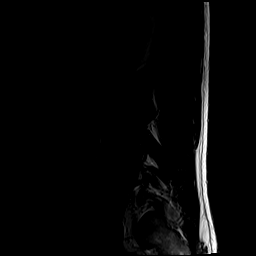

[Series 6: T2 · axial · 4.0mm · 0.39mm/px · z∈[-147,+73]mm · 10 of 44 slices shown (2 of 2)]
[im 3/44]
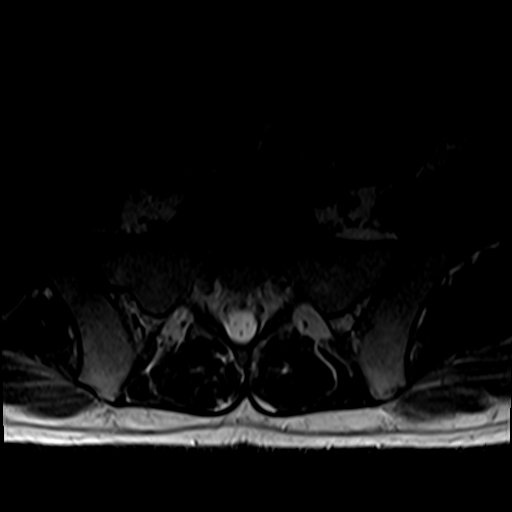
[im 6/44]
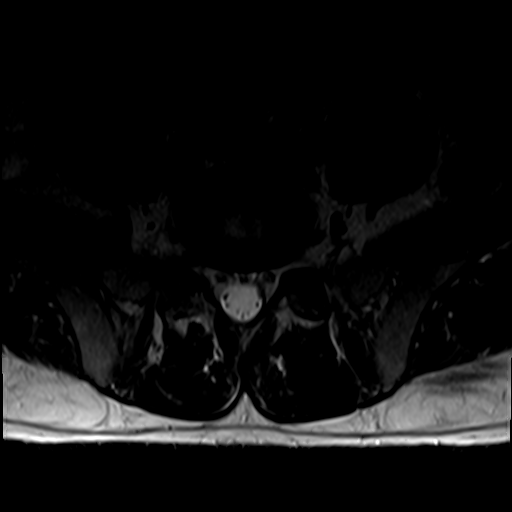
[im 9/44]
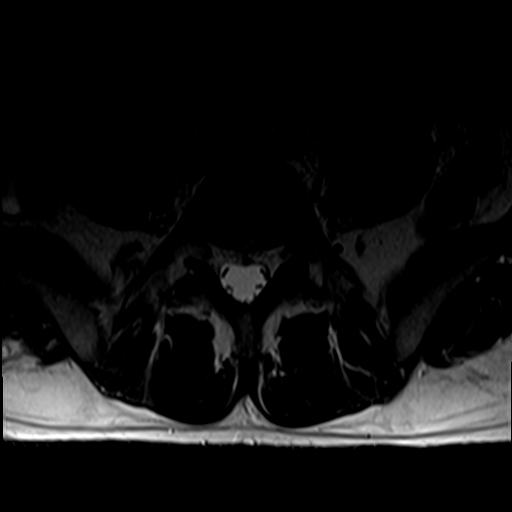
[im 15/44]
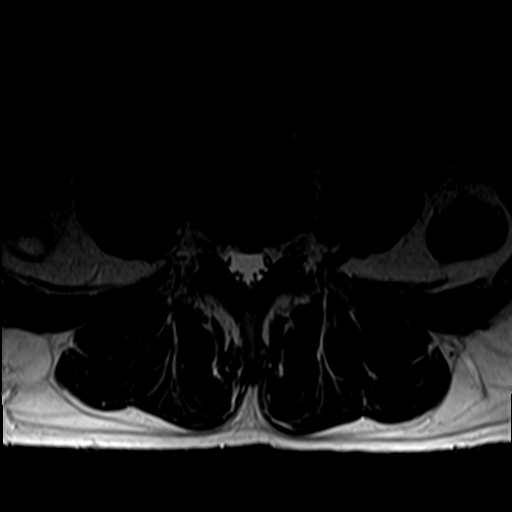
[im 21/44]
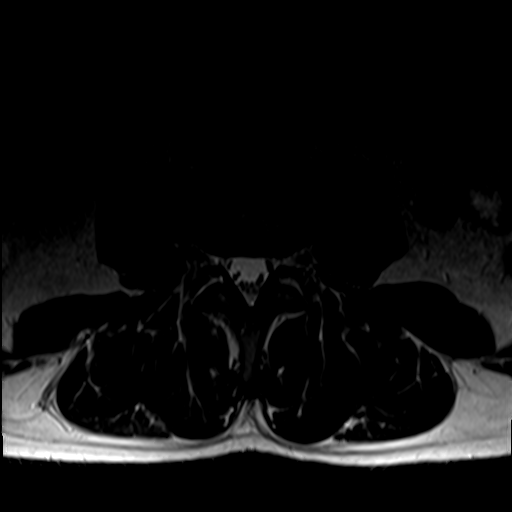
[im 23/44]
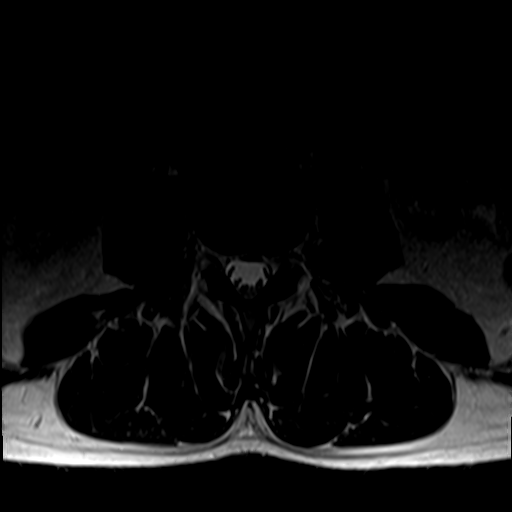
[im 26/44]
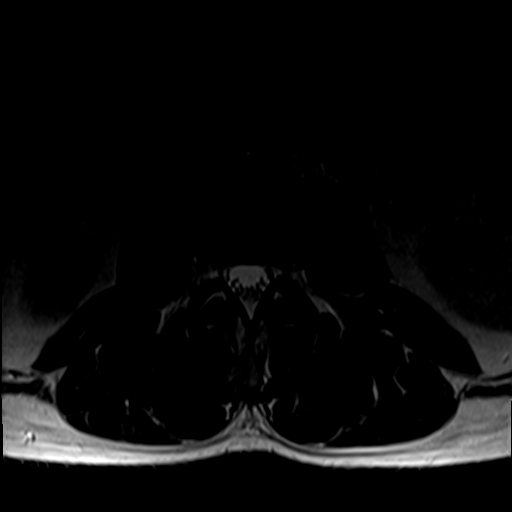
[im 32/44]
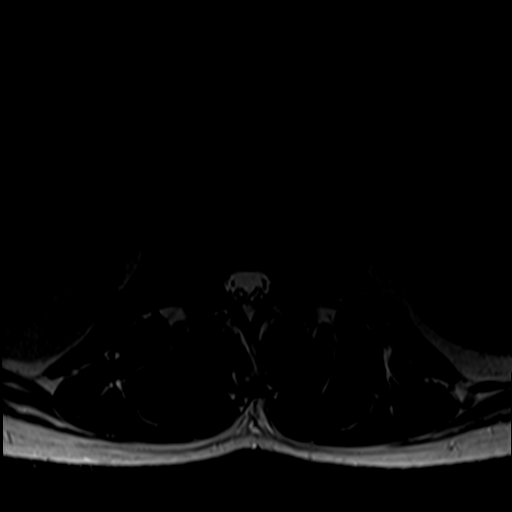
[im 38/44]
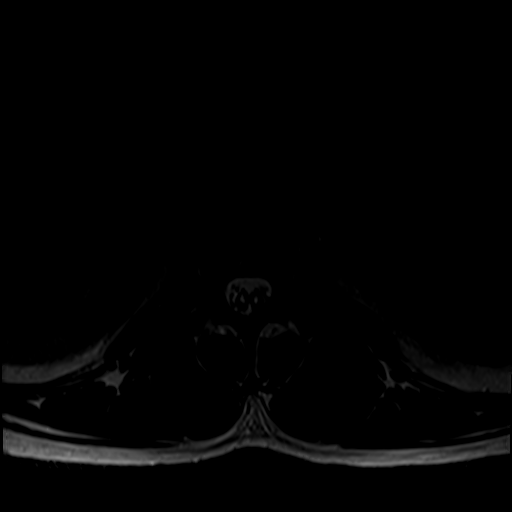
[im 44/44]
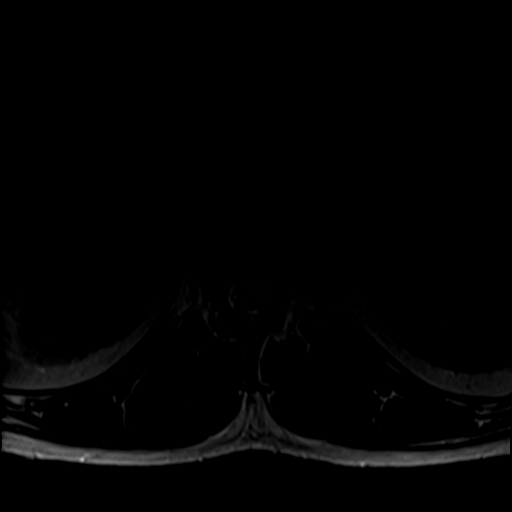

[Series 7: T1 · axial · 4.0mm · 0.39mm/px · z∈[-147,+44]mm · 6 of 44 slices shown (2 of 2)]
[im 3/44]
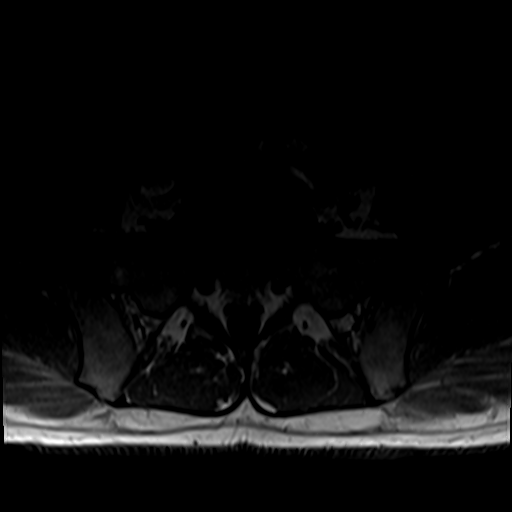
[im 6/44]
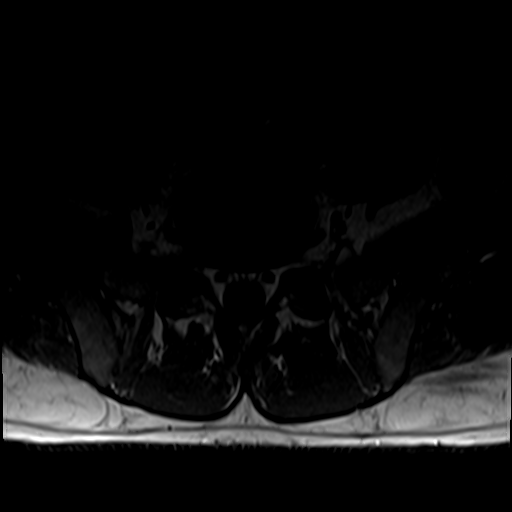
[im 9/44]
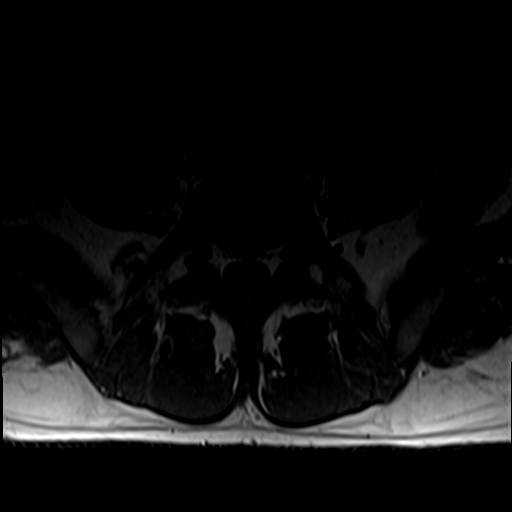
[im 15/44]
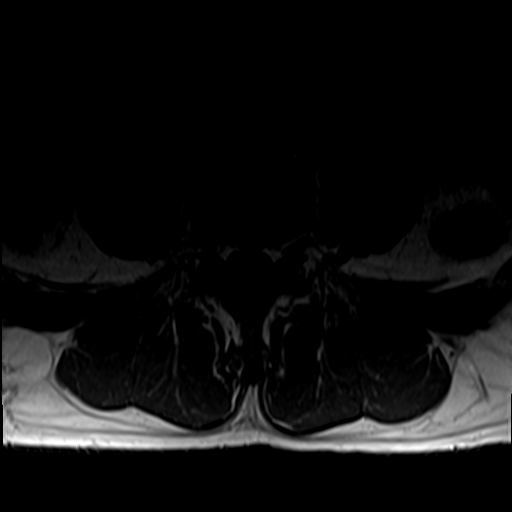
[im 23/44]
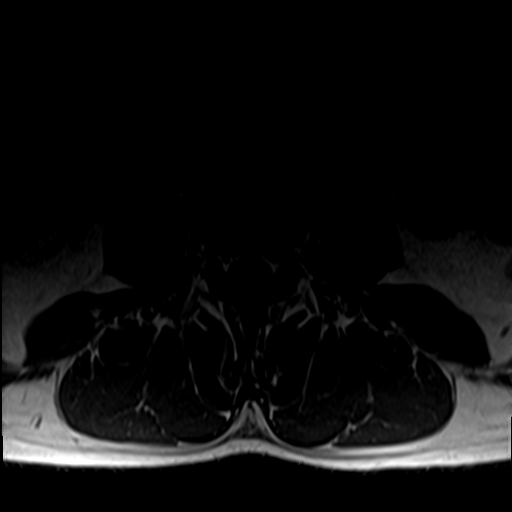
[im 38/44]
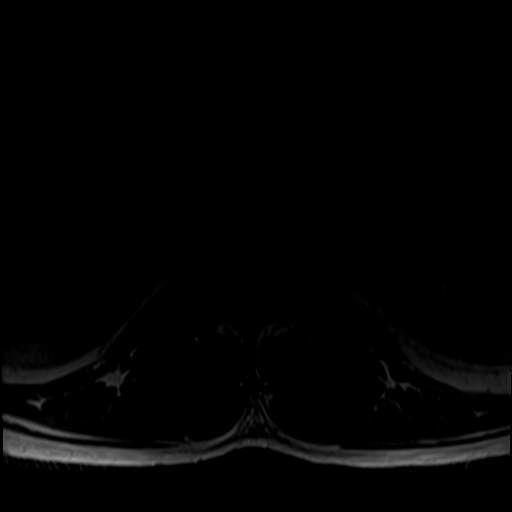

[27 of 48 positions shown; findings below may reference images not displayed]

FINDINGS: Segmentation:  5 lumbar type vertebral bodies.

Alignment:  Normal

Vertebrae:  Normal

Conus medullaris and cauda equina: Conus extends to the L1 level.
Conus and cauda equina appear normal.

Paraspinal and other soft tissues: Normal

Disc levels:

No disc abnormality in the lumbar region. No degeneration, bulge or
herniation. Very minimal facet osteoarthritis at the L4-5 level but
without edematous change or encroachment upon neural spaces.
IMPRESSION: Negative examination of the lumbar spine. No stenosis of the canal
or foramina. No disc pathology. Very minimal facet osteoarthritis at
L4-5, probably subclinical.

## 2018-11-05 LAB — B. BURGDORFI ANTIBODIES: Lyme IgG/IgM Ab: <0.91 {ISR} (ref 0.00–0.90)

## 2018-11-05 LAB — SEDIMENTATION RATE: Sed Rate: 3 mm/hr (ref 0–15)

## 2018-11-05 LAB — MULTIPLE MYELOMA PANEL, SERUM
Albumin SerPl Elph-Mcnc: 4.1 g/dL (ref 2.9–4.4)
Albumin/Glob SerPl: 1.7 (ref 0.7–1.7)
Alpha 1: 0.2 g/dL (ref 0.0–0.4)
Alpha2 Glob SerPl Elph-Mcnc: 0.6 g/dL (ref 0.4–1.0)
B-Globulin SerPl Elph-Mcnc: 0.9 g/dL (ref 0.7–1.3)
Gamma Glob SerPl Elph-Mcnc: 0.8 g/dL (ref 0.4–1.8)
Globulin, Total: 2.5 g/dL (ref 2.2–3.9)
IgA/Immunoglobulin A, Serum: 174 mg/dL (ref 90–386)
IgG (Immunoglobin G), Serum: 850 mg/dL (ref 603–1613)
IgM (Immunoglobulin M), Srm: 82 mg/dL (ref 20–172)
Total Protein: 6.6 g/dL (ref 6.0–8.5)

## 2018-11-05 LAB — RPR: RPR Ser Ql: NONREACTIVE

## 2018-11-05 LAB — ANGIOTENSIN CONVERTING ENZYME: Angio Convert Enzyme: 22 U/L (ref 14–82)

## 2018-11-05 LAB — ANA W/REFLEX: Anti Nuclear Antibody (ANA): NEGATIVE

## 2018-11-05 LAB — VITAMIN B12: Vitamin B-12: 631 pg/mL (ref 232–1245)

## 2018-11-05 LAB — RHEUMATOID FACTOR: Rheumatoid fact SerPl-aCnc: <10 IU/mL (ref 0.0–13.9)

## 2018-11-06 ENCOUNTER — Encounter: Payer: Self-pay | Admitting: Orthopedic Surgery

## 2018-11-06 ENCOUNTER — Telehealth: Payer: Self-pay

## 2018-11-06 ENCOUNTER — Other Ambulatory Visit: Payer: Self-pay

## 2018-11-06 ENCOUNTER — Ambulatory Visit (INDEPENDENT_AMBULATORY_CARE_PROVIDER_SITE_OTHER): Payer: Medicaid Other | Admitting: Orthopedic Surgery

## 2018-11-06 VITALS — Ht 70.0 in | Wt 218.0 lb

## 2018-11-06 DIAGNOSIS — M5416 Radiculopathy, lumbar region: Secondary | ICD-10-CM

## 2018-11-06 NOTE — Telephone Encounter (Signed)
-----   Message from Kathrynn Ducking, MD sent at 11/05/2018  4:52 PM EDT -----  The blood work results are unremarkable. Please call the patient. ----- Message ----- From: Lavone Neri Lab Results In Sent: 11/01/2018   7:38 AM EDT To: Kathrynn Ducking, MD

## 2018-11-06 NOTE — Telephone Encounter (Signed)
I reached out to the pt and he and I reviewed results. He verbalized understanding and had no questions at this time.

## 2018-11-06 NOTE — Progress Notes (Signed)
Office Visit Note   Patient: Ronald Banks           Date of Birth: 09-28-1975           MRN: [id number] Visit Date: 11/06/2018 Requested by: [name], MD [place],  [place] [place] [id number] PCP: [name], MD  Subjective: Chief Complaint  Patient presents with  . Lower Back - Pain, Follow-up    MRI Lumbar Review    HPI: Ronald Banks is a patient with low back pain.  Since have seen was had MRI scan of the lumbar spine which shows fairly minimal degenerative changes at L4-5 facet joint otherwise normal.  I reviewed the scan with the patient.  Does report primarily low back pain down the legs to the toes but he localizes it about a handbreadth proximal to the SI joint.  Uses a muscle relaxer which does not help.  Sitting and leaning forward does help his symptoms.  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  Denies any fevers or chills or flank pain per se.              ROS: All systems reviewed are negative as they relate to the chief complaint within the history of present illness.  Patient denies  fevers or chills.   Assessment & Plan: Visit Diagnoses:  1. Radiculopathy, lumbar region     Plan: Impression is left lower extremity pain localizing to the low back off midline about 5 cm and proximal to the SI joint by about 8 cm.  No real right-sided symptoms.  No atrophy in the legs.  MRI scan essentially normal except for some mild facet arthritis.  I like him to see Dr. Ernestina Patches for clinical evaluation.  Follow-up with me as needed but I do not think he has a surgical problem in the back.  I do not think long-term narcotics are a great idea for him either.  Sometimes his back pain issue is present and is a muscular strain pattern as opposed to anything torn on a macro scale.  Follow-Up Instructions: No follow-ups on file.   Orders:  Orders Placed This Encounter  Procedures  . Ambulatory referral to Physical Medicine Rehab   No orders of the defined types  were placed in this encounter.     Procedures: No procedures performed   Clinical Data: No additional findings.  Objective: Vital Signs: Ht 5' 10"  (1.778 m)   Wt 218 lb (98.9 kg)   BMI 31.28 kg/m   Physical Exam:   Constitutional: Patient appears well-developed HEENT:  Head: Normocephalic Eyes:EOM are normal Neck: Normal range of motion Cardiovascular: Normal rate Pulmonary/chest: Effort normal Neurologic: Patient is alert Skin: Skin is warm Psychiatric: Patient has normal mood and affect    Ortho Exam: Ortho exam demonstrates some spasm and tightness in the lumbar spine just to the left of midline about handbreadth above the SI joint.  Mild pain with forward lateral bending but no groin pain with internal X rotation leg.  Mildly positive nerve root tension signs on the left negative on the right.  Some pain with forward lateral bending.  Rest of his exam is unchanged  Specialty Comments:  No specialty comments available.  Imaging: No results found.   PMFS History: Patient Active Problem List   Diagnosis Date Noted  . Diabetic peripheral neuropathy associated with type 1 diabetes mellitus ([place]) 02/11/2018  . Hyperlipidemia LDL goal <70 02/11/2018  . Hypothyroidism 02/11/2018  .  Chronic right shoulder pain 02/11/2018  . Chronic midline low back pain with right-sided sciatica 02/11/2018  . Anxiety 02/11/2018   Past Medical History:  Diagnosis Date  . Diabetes mellitus without complication ([place])    type 1   . Hyperlipidemia   . Thyroid disease     Family History  Problem Relation Age of Onset  . Diabetes Mother   . Cancer Mother        Brain, breast   . Diabetes Sister   . Diabetes Maternal Grandmother     Past Surgical History:  Procedure Laterality Date  . NO PAST SURGERIES     Social History   Occupational History  . Not on file  Tobacco Use  . Smoking status: Current Every Day Smoker    Packs/day: 0.50    Types: Cigarettes  . Smokeless  tobacco: Never Used  Substance and Sexual Activity  . Alcohol use: Not Currently  . Drug use: Yes    Types: Marijuana    Comment: Several times per week  . Sexual activity: Yes

## 2018-11-26 ENCOUNTER — Ambulatory Visit (INDEPENDENT_AMBULATORY_CARE_PROVIDER_SITE_OTHER): Payer: Medicaid Other | Admitting: Physical Medicine and Rehabilitation

## 2018-11-26 ENCOUNTER — Other Ambulatory Visit: Payer: Self-pay

## 2018-11-26 ENCOUNTER — Encounter: Payer: Self-pay | Admitting: Physical Medicine and Rehabilitation

## 2018-11-26 VITALS — BP 158/80 | HR 63

## 2018-11-26 DIAGNOSIS — M5442 Lumbago with sciatica, left side: Secondary | ICD-10-CM | POA: Diagnosis not present

## 2018-11-26 DIAGNOSIS — E1042 Type 1 diabetes mellitus with diabetic polyneuropathy: Secondary | ICD-10-CM

## 2018-11-26 DIAGNOSIS — M47816 Spondylosis without myelopathy or radiculopathy, lumbar region: Secondary | ICD-10-CM | POA: Diagnosis not present

## 2018-11-26 DIAGNOSIS — G8929 Other chronic pain: Secondary | ICD-10-CM

## 2018-11-26 NOTE — Progress Notes (Signed)
  Numeric Pain Rating Scale and Functional Assessment Average Pain 8 Pain Right Now 8 My pain is constant and sharp Pain is worse with: sitting and standing Pain improves with: leaning forward.   In the last MONTH (on 0-10 scale) has pain interfered with the following?  1. General activity like being  able to carry out your everyday physical activities such as walking, climbing stairs, carrying groceries, or moving a chair?  Rating(6)  2. Relation with others like being able to carry out your usual social activities and roles such as  activities at home, at work and in your community. Rating(6)  3. Enjoyment of life such that you have  been bothered by emotional problems such as feeling anxious, depressed or irritable?  Rating(10)

## 2018-11-27 ENCOUNTER — Other Ambulatory Visit: Payer: Self-pay | Admitting: Family Medicine

## 2018-11-27 DIAGNOSIS — E1042 Type 1 diabetes mellitus with diabetic polyneuropathy: Secondary | ICD-10-CM

## 2018-11-27 DIAGNOSIS — E1069 Type 1 diabetes mellitus with other specified complication: Secondary | ICD-10-CM

## 2018-11-27 NOTE — Telephone Encounter (Signed)
Patients insulin was changed in June 2020. Please refill the appropriate dosage.

## 2018-12-04 ENCOUNTER — Encounter: Payer: Medicaid Other | Admitting: Neurology

## 2018-12-06 ENCOUNTER — Telehealth: Payer: Self-pay | Admitting: Physical Medicine and Rehabilitation

## 2018-12-06 NOTE — Telephone Encounter (Signed)
We can switch from Gabapentin to Lyrica which is much stronger for nerve pain, opiods  Usually not helpful. Can talk to PCP or neurology if something else needed. If Lyrica RX needed let me know.

## 2018-12-06 NOTE — Telephone Encounter (Signed)
I would do injection after EMG as it could make results of EMG not as accurate

## 2018-12-06 NOTE — Telephone Encounter (Signed)
Patient states that EMG is scheduled for January of 2021. He is requesting something for the pain if de needs to delay his injection. Please advise.

## 2018-12-09 ENCOUNTER — Ambulatory Visit: Payer: Self-pay

## 2018-12-09 ENCOUNTER — Encounter: Payer: Self-pay | Admitting: Physical Medicine and Rehabilitation

## 2018-12-09 ENCOUNTER — Other Ambulatory Visit: Payer: Self-pay

## 2018-12-09 ENCOUNTER — Ambulatory Visit (INDEPENDENT_AMBULATORY_CARE_PROVIDER_SITE_OTHER): Payer: Medicaid Other | Admitting: Physical Medicine and Rehabilitation

## 2018-12-09 VITALS — BP 135/81 | HR 61

## 2018-12-09 DIAGNOSIS — M47816 Spondylosis without myelopathy or radiculopathy, lumbar region: Secondary | ICD-10-CM | POA: Diagnosis not present

## 2018-12-09 MED ORDER — METHYLPREDNISOLONE ACETATE 80 MG/ML IJ SUSP
80.0000 mg | Freq: Once | INTRAMUSCULAR | Status: AC
  Administered 2018-12-09: 80 mg

## 2018-12-09 NOTE — Progress Notes (Signed)
 .  Numeric Pain Rating Scale and Functional Assessment Average Pain 8   In the last MONTH (on 0-10 scale) has pain interfered with the following?  1. General activity like being  able to carry out your everyday physical activities such as walking, climbing stairs, carrying groceries, or moving a chair?  Rating(8)   +Driver, -BT, -Dye Allergies.  

## 2018-12-26 ENCOUNTER — Other Ambulatory Visit: Payer: Self-pay

## 2018-12-26 ENCOUNTER — Ambulatory Visit: Payer: Medicaid Other | Attending: Family Medicine | Admitting: Physician Assistant

## 2018-12-26 DIAGNOSIS — R05 Cough: Secondary | ICD-10-CM | POA: Diagnosis not present

## 2018-12-26 DIAGNOSIS — E1065 Type 1 diabetes mellitus with hyperglycemia: Secondary | ICD-10-CM

## 2018-12-26 DIAGNOSIS — R059 Cough, unspecified: Secondary | ICD-10-CM

## 2018-12-26 MED ORDER — BENZONATATE 100 MG PO CAPS: 200.0000 mg | ORAL_CAPSULE | Freq: Three times a day (TID) | ORAL | 0 refills | Status: DC | PRN

## 2018-12-26 MED ORDER — OMEPRAZOLE 20 MG PO CPDR: 20.0000 mg | DELAYED_RELEASE_CAPSULE | Freq: Every day | ORAL | 3 refills | Status: DC

## 2018-12-26 NOTE — Progress Notes (Signed)
Virtual Visit via Telephone Note  I connected with Ronald Banks on 12/26/18 at 10:30 AM EST by telephone and verified that I am speaking with the correct person using two identifiers.   I discussed the limitations, risks, security and privacy concerns of performing an evaluation and management service by telephone and the availability of in person appointments. I also discussed with the patient that there may be a patient responsible charge related to this service. The patient expressed understanding and agreed to proceed.  Patient location:  home My Location:  [place] office Persons on the call:  Me and the patient  History of Present Illness:  Coughing for several weeks.  Cough is worse at night.  No fever.  No other symptoms.  No new meds.  He hasn't taken anything for the cough.  Cough is mostly non-productive except a little clear mucus.  Doesn't feel sick.  Normal energy.  No SOB.  No wheezing.  No known exposure to Covid.  No GI s/sx.  Blood sugar this morning 147.  Ranging from 60-300(managed by endocrine)    Observations/Objective:  NAD.  A&Ox3   Assessment and Plan: 1. Cough Reflux vs allergy? - benzonatate (TESSALON) 100 MG capsule; Take 2 capsules (200 mg total) by mouth 3 (three) times daily as needed.  Dispense: 40 capsule; Refill: 0 - omeprazole (PRILOSEC) 20 MG capsule; Take 1 capsule (20 mg total) by mouth daily.  Dispense: 30 capsule; Refill: 3    Follow Up Instructions: Make appt with PCP if cough not resolved in 2 weeks;  Sooner if worsens.  Or see PCP at standard interval   I discussed the assessment and treatment plan with the patient. The patient was provided an opportunity to ask questions and all were answered. The patient agreed with the plan and demonstrated an understanding of the instructions.   The patient was advised to call back or seek an in-person evaluation if the symptoms worsen or if the condition fails to improve as anticipated.  I provided 9 minutes  of non-face-to-face time during this encounter.   Freeman Caldron, PA-C  Patient ID: Ronald Banks, male   DOB: May 27, 1975, 43 y.o.   MRN: [id number]

## 2019-01-09 ENCOUNTER — Ambulatory Visit: Payer: Medicaid Other | Attending: Family Medicine | Admitting: Family Medicine

## 2019-01-09 ENCOUNTER — Other Ambulatory Visit: Payer: Self-pay

## 2019-01-09 ENCOUNTER — Telehealth: Payer: Self-pay

## 2019-01-09 ENCOUNTER — Encounter: Payer: Self-pay | Admitting: Family Medicine

## 2019-01-09 DIAGNOSIS — F339 Major depressive disorder, recurrent, unspecified: Secondary | ICD-10-CM

## 2019-01-09 DIAGNOSIS — R058 Other specified cough: Secondary | ICD-10-CM

## 2019-01-09 DIAGNOSIS — E1065 Type 1 diabetes mellitus with hyperglycemia: Secondary | ICD-10-CM

## 2019-01-09 DIAGNOSIS — E1069 Type 1 diabetes mellitus with other specified complication: Secondary | ICD-10-CM

## 2019-01-09 DIAGNOSIS — R05 Cough: Secondary | ICD-10-CM

## 2019-01-09 DIAGNOSIS — N529 Male erectile dysfunction, unspecified: Secondary | ICD-10-CM

## 2019-01-09 MED ORDER — SILDENAFIL CITRATE 100 MG PO TABS: 50.0000 mg | ORAL_TABLET | Freq: Every day | ORAL | 5 refills | Status: DC | PRN

## 2019-01-09 MED ORDER — PREDNISONE 20 MG PO TABS: ORAL_TABLET | ORAL | 0 refills | Status: DC

## 2019-01-09 MED ORDER — DOXYCYCLINE HYCLATE 100 MG PO TABS: 100.0000 mg | ORAL_TABLET | Freq: Two times a day (BID) | ORAL | 0 refills | Status: DC

## 2019-01-09 NOTE — Telephone Encounter (Signed)
At request of Dr [name], call placed to Dr Honor Junes patient's endocrinologist's office to inform them that the patient reports that his DEXcom meter has stopped working. Spoke to [place] and requested that they reach out to patient to offer assistance.

## 2019-01-09 NOTE — Progress Notes (Signed)
Patient verified DOB Patient has not eaten today. Patient has not taken medication today. Patient complains of chronic left leg pain for years.

## 2019-01-09 NOTE — Progress Notes (Signed)
Virtual Visit via Telephone Note  I connected with Ronald Banks on 05/17/19 at  9:50 AM EST by telephone and verified that I am speaking with the correct person using two identifiers.   I discussed the limitations, risks, security and privacy concerns of performing an evaluation and management service by telephone and the availability of in person appointments. I also discussed with the patient that there may be a patient responsible charge related to this service. The patient expressed understanding and agreed to proceed.  Patient Location: Home Provider Location: [facility] Office Others participating in call: none   History of Present Illness:         43 year old male with type 1 diabetes, hypertension, hypothyroidism and issues with chronic left leg pain who reports at today's visit that he has had a recurrent cough for about the past 3 months.  Cough is generally nonproductive but he occasionally will have a few days of white sputum with increased shortness of breath.  He does not feel as if he is wheezing.  He does continue to smoke.  He reports that he is taking all of his prescribed medications.  Fasting blood sugars are in the 140s to 160s.  He denies any current increased thirst or frequent urination.  He has noticed issues with erectile dysfunction for which he would like to try Viagra.  He denies any issues with chest pain or palpitations.  He has never been on nitroglycerin.           Patient completed PHQ-9 screen with [facility] earlier and patient admits that he does feel as if he has had some chronic depressive symptoms related to his type 1 diabetes and chronic pain issues as well and is due to worry about his long-term health.  He admits that he has had some passive thoughts of days when he felt as if it would be better if he did not wake up but he denies any active suicidal plans.  He reports that he does not feel that he would ever actually harm himself.   Past Medical History:  Diagnosis  Date  . Diabetes mellitus without complication ([place])    type 1   . Hyperlipidemia   . Thyroid disease     Past Surgical History:  Procedure Laterality Date  . NO PAST SURGERIES      Family History  Problem Relation Age of Onset  . Diabetes Mother   . Cancer Mother        Brain, breast   . Diabetes Sister   . Diabetes Maternal Grandmother     Social History   Tobacco Use  . Smoking status: Current Every Day Smoker    Packs/day: 2.00    Years: 30.00    Pack years: 60.00    Types: Cigarettes  . Smokeless tobacco: Never Used  Substance Use Topics  . Alcohol use: Not Currently  . Drug use: Yes    Types: Marijuana    Comment: Several times per week     No Known Allergies     Observations/Objective: No vital signs or physical exam conducted as visit was done via telephone  Assessment and Plan: 1. Recurrent productive cough Patient continues to smoke and reports that he has had a recurrent cough for the past 3 months.  Cough is occasionally slightly productive of whitish phlegm.  Discussed the need for evaluation by pulmonology to which patient agrees and discussed concerned that patient might have COPD due to his long-term use of cigarettes.  He currently takes telmisartan for his blood pressure but I do not believe that this is contributing to his chronic cough but consideration must be given.  Patient is being placed on doxycycline 100 mg twice daily and prednisone and he is to keep follow-up with pulmonology.  If he has any acute worsening of cough, chest pain or difficulty breathing he should go to the emergency department for further evaluation.  He denies any fever or chills, no loss of sensation of taste or smell and no exposure to anyone with COVID-19. - Ambulatory referral to Pulmonology - doxycycline (VIBRA-TABS) 100 MG tablet; Take 1 tablet (100 mg total) by mouth 2 (two) times daily.  Dispense: 20 tablet; Refill: 0 - predniSONE (DELTASONE) 20 MG tablet; One daily  after a meal for 5 days.  Dispense: 5 tablet; Refill: 0  2. Major depression, recurrent, chronic [facility]) Patient with PHQ-9 screen done at today's visit and patient admits to some depression due to his chronic medical conditions.  Patient admits that he has occasional passive suicidal thoughts but has no actual intention of carrying out self-harm.  He agrees to medical social work follow-up acutely as well as referral to psychiatry for ongoing evaluation and treatment of depression.  He has been asked to go to the emergency department for further evaluation and treatment if he does develop any suicidal thoughts or ideations. - Ambulatory referral to Social Work - Ambulatory referral to Psychiatry  3. Uncontrolled type 1 diabetes mellitus with hyperglycemia ([place]) 4. Erectile dysfunction due to type 1 diabetes mellitus ([place]) Patient with history of type 1 diabetes which is not always been controlled.  Patient's most recent hemoglobin A1c done 09/11/2018 was elevated at 8.6.  Discussed with patient the need to better control diabetes to help avoid further long-term complications.  I discussed with the patient that his issues with erectile dysfunction are likely related to his type 1 diabetes, hypertension and hyperlipidemia.  Patient wishes to try Viagra and he denies any current issues with chest pain and no nitroglycerin use.  Prescription sent to patient's pharmacy for Viagra and he may start with 1/2 pill, 50 mg but may need 1 whole pill 100 mg prior to intended sexual activity.  If the medication is ineffective he should call the office as he may need referral to urology.  If he develops any chest pain, visual disturbances or any other complications with Viagra use he should immediately stop the medication and seek medical attention if he continues to have symptoms.  He is to schedule follow-up of diabetes. - sildenafil (VIAGRA) 100 MG tablet; Take 0.5-1 tablets (50-100 mg total) by mouth daily as needed  for erectile dysfunction.  Dispense: 5 tablet; Refill: 5  Follow Up Instructions:Return in about 6 weeks (around 02/20/2019) for cough/depression.    I discussed the assessment and treatment plan with the patient. The patient was provided an opportunity to ask questions and all were answered. The patient agreed with the plan and demonstrated an understanding of the instructions.   The patient was advised to call back or seek an in-person evaluation if the symptoms worsen or if the condition fails to improve as anticipated.  I provided 16 minutes of non-face-to-face time during this encounter.   [name], MD

## 2019-01-22 ENCOUNTER — Telehealth: Payer: Self-pay

## 2019-01-22 NOTE — Telephone Encounter (Signed)
Cancelled appt , provider tested positive to Covid.    Informed pt of cancellation Please r/s

## 2019-01-29 ENCOUNTER — Encounter: Payer: Medicaid Other | Admitting: Neurology

## 2019-02-03 ENCOUNTER — Ambulatory Visit: Payer: Medicaid Other | Admitting: Neurology

## 2019-02-03 ENCOUNTER — Encounter: Payer: Self-pay | Admitting: Neurology

## 2019-02-03 ENCOUNTER — Other Ambulatory Visit: Payer: Self-pay

## 2019-02-03 ENCOUNTER — Ambulatory Visit (INDEPENDENT_AMBULATORY_CARE_PROVIDER_SITE_OTHER): Payer: Medicaid Other | Admitting: Neurology

## 2019-02-03 DIAGNOSIS — M5432 Sciatica, left side: Secondary | ICD-10-CM

## 2019-02-03 DIAGNOSIS — E1042 Type 1 diabetes mellitus with diabetic polyneuropathy: Secondary | ICD-10-CM

## 2019-02-03 DIAGNOSIS — Z0289 Encounter for other administrative examinations: Secondary | ICD-10-CM

## 2019-02-03 NOTE — Progress Notes (Signed)
Please refer to EMG and nerve conduction procedure note.  It appears that the MRI of the lumbar spine was done recently on 02 November 2018.  I will cancel the MRI that we ordered.

## 2019-02-03 NOTE — Procedures (Signed)
     HISTORY:  Ronald Banks is a 44 year old gentleman with a history of type 1 diabetes who reports back pain and pain down the left leg.  He also reports some numbness in both hands without any overlying neck pain or pain down the arms.  He is being evaluated for possible neuropathy or a lumbosacral radiculopathy.  NERVE CONDUCTION STUDIES:  Nerve conduction studies were performed on both upper extremities.  The distal motor latencies for the median nerves were prolonged on the right, normal on the left and with normal motor amplitudes for these nerves.  The distal motor latencies for the ulnar nerves were normal on the right, prolonged on the left, with a normal motor amplitude on the right, low on the left.  Nerve conduction velocities for the median and ulnar nerves were normal bilaterally.  The sensory latencies for the median nerves were prolonged on the right, normal on the left and prolonged for the right ulnar nerve and normal for the left ulnar nerve.  The right radial sensory latency was prolonged, normal on the left.  The F-wave latencies for the ulnar nerves were prolonged bilaterally.  Nerve conduction studies were performed on the left lower extremity.  The distal motor latency for the left peroneal nerve was borderline normal, prolonged for the left posterior tibial nerve.  The motor amplitudes for the left peroneal and posterior tibial nerves were low and slowing was seen for these nerves.  The sural and peroneal sensory latencies on the left were unobtainable.  The F-wave latency for the left posterior tibial nerve was prolonged.  EMG STUDIES:  EMG study was performed on the left lower extremity:  The tibialis anterior muscle reveals 2 to 4K motor units with full recruitment. No fibrillations or positive waves were seen. The peroneus tertius muscle reveals 2 to 4K motor units with slightly reduced recruitment. No fibrillations or positive waves were seen. The medial  gastrocnemius muscle reveals 1 to 3K motor units with full recruitment. No fibrillations or positive waves were seen. The vastus lateralis muscle reveals 2 to 4K motor units with full recruitment. No fibrillations or positive waves were seen.  The patient refused further study.  IMPRESSION:  Nerve conduction studies were performed on both upper extremities and on the left lower extremity.  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  There is evidence of a mild right carpal tunnel syndrome, this is not present on the left.  EMG study of the left lower extremity was limited due to patient refusal, otherwise no clear evidence of an overlying lumbosacral radiculopathy could be seen on this evaluation.  Jill Alexanders MD 02/03/2019 3:40 PM  [facility] [address] [place], [place] [address]  Phone [phone] Fax [phone]

## 2019-02-03 NOTE — Progress Notes (Addendum)
The patient comes in today for EMG nerve conduction study evaluation.  The nerve conductions do show evidence of a peripheral neuropathy of moderate severity, likely related to diabetes.  The patient does have a mild right carpal tunnel syndrome but none on the left.  Some mild distal primarily motor dysfunction of the left ulnar nerve is seen.  EMG of the left leg was relatively unremarkable but the patient refused full study.  He continues to have pain down the left leg, we will go on to get MRI of the lumbar spine.  The use of Cymbalta and gabapentin have not been fully effective in controlling the pain.      [place]    Nerve / Sites Muscle Latency Ref. Amplitude Ref. Rel Amp Segments Distance Velocity Ref. Area    ms ms mV mV %  cm m/s m/s mVms  R Median - APB     Wrist APB 4.6 ?4.4 4.8 ?4.0 100 Wrist - APB 7   18.1     Upper arm APB 9.8  4.8  101 Upper arm - Wrist 26 50 ?49 17.6  L Median - APB     Wrist APB 3.8 ?4.4 6.9 ?4.0 100 Wrist - APB 7   26.0     Upper arm APB 8.6  5.1  74.6 Upper arm - Wrist 26 55 ?49 22.1  R Ulnar - ADM     Wrist ADM 2.8 ?3.3 7.6 ?6.0 100 Wrist - ADM 7   22.4     B.Elbow ADM 6.9  6.3  82.1 B.Elbow - Wrist 22 54 ?49 19.3     A.Elbow ADM 8.8  5.8  93.5 A.Elbow - B.Elbow 10 53 ?49 18.3         A.Elbow - Wrist      L Ulnar - ADM     Wrist ADM 3.5 ?3.3 1.2 ?6.0 100 Wrist - ADM 7   2.8     B.Elbow ADM 7.9  1.7  137 B.Elbow - Wrist 22 50 ?49 5.0     A.Elbow ADM 9.9  1.3  73.3 A.Elbow - B.Elbow 10 50 ?49 4.2         A.Elbow - Wrist      L Peroneal - EDB     Ankle EDB 6.5 ?6.5 0.9 ?2.0 100 Ankle - EDB 9   3.6     Fib head EDB 16.4  0.7  79 Fib head - Ankle 32 33 ?44 2.1     Pop fossa EDB 19.6  0.4  61.2 Pop fossa - Fib head 10 31 ?44 1.3         Pop fossa - Ankle      L Tibial - AH     Ankle AH 7.0 ?5.8 3.7 ?4.0 100 Ankle - AH 9   13.3     Pop fossa AH 19.3  2.7  72.4 Pop fossa - Ankle 38 31 ?41 10.2                  SNC    Nerve / Sites Rec. Site Peak Lat  Ref.  Amp Ref. Segments Distance    ms ms V V  cm  R Radial - Anatomical snuff box (Forearm)     Forearm Wrist 3.1 ?2.9 7 ?15 Forearm - Wrist 10  L Radial - Anatomical snuff box (Forearm)     Forearm Wrist 2.9 ?2.9 7 ?15 Forearm - Wrist 10  L Sural - Ankle (Calf)  Calf Ankle NR ?4.4 NR ?6 Calf - Ankle 14  L Superficial peroneal - Ankle     Lat leg Ankle NR ?4.4 NR ?6 Lat leg - Ankle 14  R Median - Orthodromic (Dig II, Mid palm)     Dig II Wrist 3.7 ?3.4 4 ?10 Dig II - Wrist 13  L Median - Orthodromic (Dig II, Mid palm)     Dig II Wrist 3.1 ?3.4 5 ?10 Dig II - Wrist 13  R Ulnar - Orthodromic, (Dig V, Mid palm)     Dig V Wrist 3.6 ?3.1 2 ?5 Dig V - Wrist 11  L Ulnar - Orthodromic, (Dig V, Mid palm)     Dig V Wrist 2.9 ?3.1 3 ?5 Dig V - Wrist 74                     F  Wave    Nerve F Lat Ref.   ms ms  R Ulnar - ADM 33.3 ?32.0  L Ulnar - ADM 34.3 ?32.0  L Tibial - AH 64.7 ?56.0

## 2019-02-12 ENCOUNTER — Ambulatory Visit: Payer: Medicaid Other | Attending: Family Medicine | Admitting: Licensed Clinical Social Worker

## 2019-02-12 ENCOUNTER — Other Ambulatory Visit: Payer: Self-pay

## 2019-02-12 DIAGNOSIS — F332 Major depressive disorder, recurrent severe without psychotic features: Secondary | ICD-10-CM

## 2019-02-14 ENCOUNTER — Telehealth: Payer: Self-pay | Admitting: Licensed Clinical Social Worker

## 2019-02-14 NOTE — Telephone Encounter (Signed)
A completed psychiatry referral was faxed to [facility].

## 2019-02-14 NOTE — BH Specialist Note (Signed)
Integrated Behavioral Health Visit via Telemedicine (Telephone)  02/14/2019 Ronald Banks [id number]   Session Start time: 4:00pm  Session End time: 4:30pm Total time: 30  Referring Provider: Dr. [name] Type of Visit: Telephonic Patient location: Home [facility] Provider location: [facility] office All persons participating in visit: MSW intern and patient  Confirmed patient's address: Yes  Confirmed patient's phone number: Yes  Any changes to demographics: No   Confirmed patient's insurance: Yes  Any changes to patient's insurance: No   Discussed confidentiality: Yes    The following statements were read to the patient and/or legal guardian that are established with the [facility] Provider.  "The purpose of this phone visit is to provide behavioral health care while limiting exposure to the coronavirus (COVID19).  There is a possibility of technology failure and discussed alternative modes of communication if that failure occurs."  "By engaging in this telephone visit, you consent to the provision of healthcare.  Additionally, you authorize for your insurance to be billed for the services provided during this telephone visit."   Patient and/or legal guardian consented to telephone visit: Yes   PRESENTING CONCERNS: Patient and/or family reports the following symptoms/concerns: Ongoing symptoms of depression and anxiety triggered by back pain and pain radiating down pt's left leg. Duration of problem: 2 years; Severity of problem: severe  STRENGTHS (Protective Factors/Coping Skills): Pt receives food stamps Patient is insured Pt resides in safe home environment with mother Pt receives support from girlfriend Patient is open to psychotherapy and medication management  GOALS ADDRESSED: Patient will: 1.  Reduce symptoms of: anxiety and depression  2.  Increase knowledge and/or ability of: coping skills and self-management skills  3.  Demonstrate ability to: Increase healthy  adjustment to current life circumstances, Increase adequate support systems for patient/family and Improve medication compliance  INTERVENTIONS: Interventions utilized:  Solution-Focused Strategies, Supportive Counseling, Medication Monitoring, Psychoeducation and/or Health Education and Link to [employer] Standardized Assessments completed: GAD-7 and PHQ 2&9  ASSESSMENT: Patient currently experiencing ongoing symptoms of depression and anxiety triggered by health conditions and financial strain. Patient reports worsening of depression symptoms over the past 2 years and states he has experienced depression throughout his life. Patient denies suicidal ideation and denies homicidal ideation.  Patient reports he stopped taking Cymbalta 2-3 months ago because it was not helping. Patient states he is open to trying a different antidepressant. MSW Intern provided psychoeducation about antidepressants and explained how they typically take 4-6 weeks to take full effect. Pt also reports he is open to initiating psychotherapy telephonically.   Patient may benefit from medication management of a different antidepressant than Cymbalta and initiating psychotherapy. MSW Intern will make a referral for patient to initiate psychotherapy at [facility].   PLAN: 1. Follow up with behavioral health clinician on : MSW intern encouraged patient to contact this MSW Intern or Christa See, LCSW if symptoms of depression/anxiety persist. 2. Behavioral recommendations: Request a change in medication at upcoming appointment with PCP Dr. [name] 02/20/19 and participate in psychotherapy.  3. Referral(s): [facility] (preference for telephonic therapy).  Berniece Salines  MSW Intern 02/14/2019 12:32pm

## 2019-02-17 ENCOUNTER — Other Ambulatory Visit: Payer: Self-pay | Admitting: Family Medicine

## 2019-02-20 ENCOUNTER — Ambulatory Visit: Payer: Medicaid Other | Attending: Family Medicine | Admitting: Family Medicine

## 2019-02-20 ENCOUNTER — Encounter: Payer: Self-pay | Admitting: Family Medicine

## 2019-02-20 ENCOUNTER — Other Ambulatory Visit: Payer: Self-pay

## 2019-02-20 DIAGNOSIS — E1069 Type 1 diabetes mellitus with other specified complication: Secondary | ICD-10-CM

## 2019-02-20 DIAGNOSIS — F17200 Nicotine dependence, unspecified, uncomplicated: Secondary | ICD-10-CM

## 2019-02-20 DIAGNOSIS — N521 Erectile dysfunction due to diseases classified elsewhere: Secondary | ICD-10-CM | POA: Diagnosis not present

## 2019-02-20 DIAGNOSIS — F17210 Nicotine dependence, cigarettes, uncomplicated: Secondary | ICD-10-CM | POA: Diagnosis not present

## 2019-02-20 DIAGNOSIS — R05 Cough: Secondary | ICD-10-CM | POA: Diagnosis not present

## 2019-02-20 DIAGNOSIS — N529 Male erectile dysfunction, unspecified: Secondary | ICD-10-CM

## 2019-02-20 DIAGNOSIS — R058 Other specified cough: Secondary | ICD-10-CM

## 2019-02-20 MED ORDER — SILDENAFIL CITRATE 100 MG PO TABS: 50.0000 mg | ORAL_TABLET | Freq: Every day | ORAL | 5 refills | Status: DC | PRN

## 2019-02-20 MED ORDER — ALBUTEROL SULFATE HFA 108 (90 BASE) MCG/ACT IN AERS: 2.0000 | INHALATION_SPRAY | Freq: Four times a day (QID) | RESPIRATORY_TRACT | 3 refills | Status: DC | PRN

## 2019-02-20 NOTE — Progress Notes (Signed)
Pt. Is still having cough and depression. Pt. Stated it is still the same.   Pt. Stated when he was taking the medication it got better.

## 2019-02-20 NOTE — Progress Notes (Signed)
Virtual Visit via Telephone Note  I connected with Ronald Banks on 02/20/19 at  3:50 PM EST by telephone and verified that I am speaking with the correct person using two identifiers.   I discussed the limitations, risks, security and privacy concerns of performing an evaluation and management service by telephone and the availability of in person appointments. I also discussed with the patient that there may be a patient responsible charge related to this service. The patient expressed understanding and agreed to proceed.  Patient Location: Home Provider Location: [facility] Others participating in call: none   History of Present Illness:        44 yo male seen in follow-up of productive cough with white sputum.  Patient was last seen 01/09/2019 due to complaint of productive cough and is status post treatment with prednisone and doxycycline.  He reports improvement chest congestion and sputum production while on doxycycline and prednisone but since finishing the medicine, he has had return of her recurrent cough.  He does continue to smoke.  Sputum at this time is clear.  He denies any fever or chills.  No shortness of breath or wheezing.  He has mild sensation of chest congestion.  He reports that he did follow-up with social worker after his last visit regarding anxiety and depression.  He would also like refill of medication for treatment of erectile dysfunction.  He continues to follow-up with his endocrinologist regarding his type 1 diabetes.  No chest pain or palpitations.  Past Medical History:  Diagnosis Date  . Diabetes mellitus without complication ([place])    type 1   . Hyperlipidemia   . Thyroid disease     Past Surgical History:  Procedure Laterality Date  . NO PAST SURGERIES      Family History  Problem Relation Age of Onset  . Diabetes Mother   . Cancer Mother        Brain, breast   . Diabetes Sister   . Diabetes Maternal Grandmother     Social History   Tobacco  Use  . Smoking status: Current Every Day Smoker    Packs/day: 0.50    Types: Cigarettes  . Smokeless tobacco: Never Used  Substance Use Topics  . Alcohol use: Not Currently  . Drug use: Yes    Types: Marijuana    Comment: Several times per week     No Known Allergies     Observations/Objective: No vital signs or physical exam conducted as visit was done via telephone  Assessment and Plan: 1. Recurrent productive cough Suspect that patient likely has COPD due to his issues with recurrent bronchitis and chronic cough.  He will be referred to pulmonology for further evaluation and treatment.  Prescription provided for albuterol to use as needed for shortness of breath, cough or wheezing. - Ambulatory referral to Pulmonology - albuterol (VENTOLIN HFA) 108 (90 Base) MCG/ACT inhaler; Inhale 2 puffs into the lungs every 6 (six) hours as needed for wheezing or shortness of breath.  Dispense: 18 g; Refill: 3  2. Tobacco dependence Discussed the need for complete smoking cessation as patient with suspected COPD due to his long-term smoking history.  He has been referred to pulmonology for further evaluation and treatment.  He additionally was offered follow-up with the clinical pharmacist regarding smoking cessation which patient declines at this time. - albuterol (VENTOLIN HFA) 108 (90 Base) MCG/ACT inhaler; Inhale 2 puffs into the lungs every 6 (six) hours as needed for wheezing or shortness  of breath.  Dispense: 18 g; Refill: 3  3. Erectile dysfunction due to type 1 diabetes mellitus ([place]) Refill provided for Viagra for continued treatment of erectile dysfunction associated with type 1 diabetes. - sildenafil (VIAGRA) 100 MG tablet; Take 0.5-1 tablets (50-100 mg total) by mouth daily as needed for erectile dysfunction.  Dispense: 5 tablet; Refill: 5  Follow Up Instructions:Return for Recurrent cough-1 to 2 weeks if any worsening; keep scheduled follow-up.   I discussed the assessment and  treatment plan with the patient. The patient was provided an opportunity to ask questions and all were answered. The patient agreed with the plan and demonstrated an understanding of the instructions.   The patient was advised to call back or seek an in-person evaluation if the symptoms worsen or if the condition fails to improve as anticipated.  I provided 8 minutes of non-face-to-face time during this encounter.   [name], MD

## 2019-03-05 ENCOUNTER — Ambulatory Visit: Payer: Medicaid Other | Admitting: Neurology

## 2019-03-24 ENCOUNTER — Telehealth (HOSPITAL_COMMUNITY): Payer: Self-pay | Admitting: Licensed Clinical Social Worker

## 2019-03-24 NOTE — Telephone Encounter (Signed)
Attempted contact w/ pt on both phone numbers provided in EHR. Left message informing clt of the need to cancel tomorrow's appt and left front desk info for him to reschedule.

## 2019-03-25 ENCOUNTER — Other Ambulatory Visit: Payer: Self-pay | Admitting: Family Medicine

## 2019-03-25 ENCOUNTER — Other Ambulatory Visit: Payer: Self-pay

## 2019-03-25 ENCOUNTER — Ambulatory Visit (HOSPITAL_COMMUNITY): Payer: Medicaid Other | Admitting: Licensed Clinical Social Worker

## 2019-03-25 DIAGNOSIS — E1069 Type 1 diabetes mellitus with other specified complication: Secondary | ICD-10-CM

## 2019-03-27 ENCOUNTER — Ambulatory Visit (HOSPITAL_COMMUNITY): Payer: Medicaid Other | Admitting: Licensed Clinical Social Worker

## 2019-04-07 ENCOUNTER — Other Ambulatory Visit: Payer: Self-pay | Admitting: Physician Assistant

## 2019-04-07 ENCOUNTER — Ambulatory Visit: Payer: Medicaid Other | Admitting: Internal Medicine

## 2019-04-07 ENCOUNTER — Encounter: Payer: Self-pay | Admitting: Internal Medicine

## 2019-04-07 ENCOUNTER — Other Ambulatory Visit: Payer: Self-pay

## 2019-04-07 ENCOUNTER — Ambulatory Visit (INDEPENDENT_AMBULATORY_CARE_PROVIDER_SITE_OTHER): Payer: Medicaid Other

## 2019-04-07 DIAGNOSIS — R05 Cough: Secondary | ICD-10-CM | POA: Diagnosis not present

## 2019-04-07 DIAGNOSIS — I10 Essential (primary) hypertension: Secondary | ICD-10-CM | POA: Diagnosis not present

## 2019-04-07 DIAGNOSIS — R058 Other specified cough: Secondary | ICD-10-CM

## 2019-04-07 DIAGNOSIS — F17210 Nicotine dependence, cigarettes, uncomplicated: Secondary | ICD-10-CM | POA: Diagnosis not present

## 2019-04-07 DIAGNOSIS — R059 Cough, unspecified: Secondary | ICD-10-CM

## 2019-04-07 IMAGING — DX DG CHEST 2V
2 series · 2 of 2 positions shown · non-contrast
Comparison: [DATE]

CLINICAL DATA: Cough 5 days

EXAM:
CHEST - 2 VIEW

[chest pa]
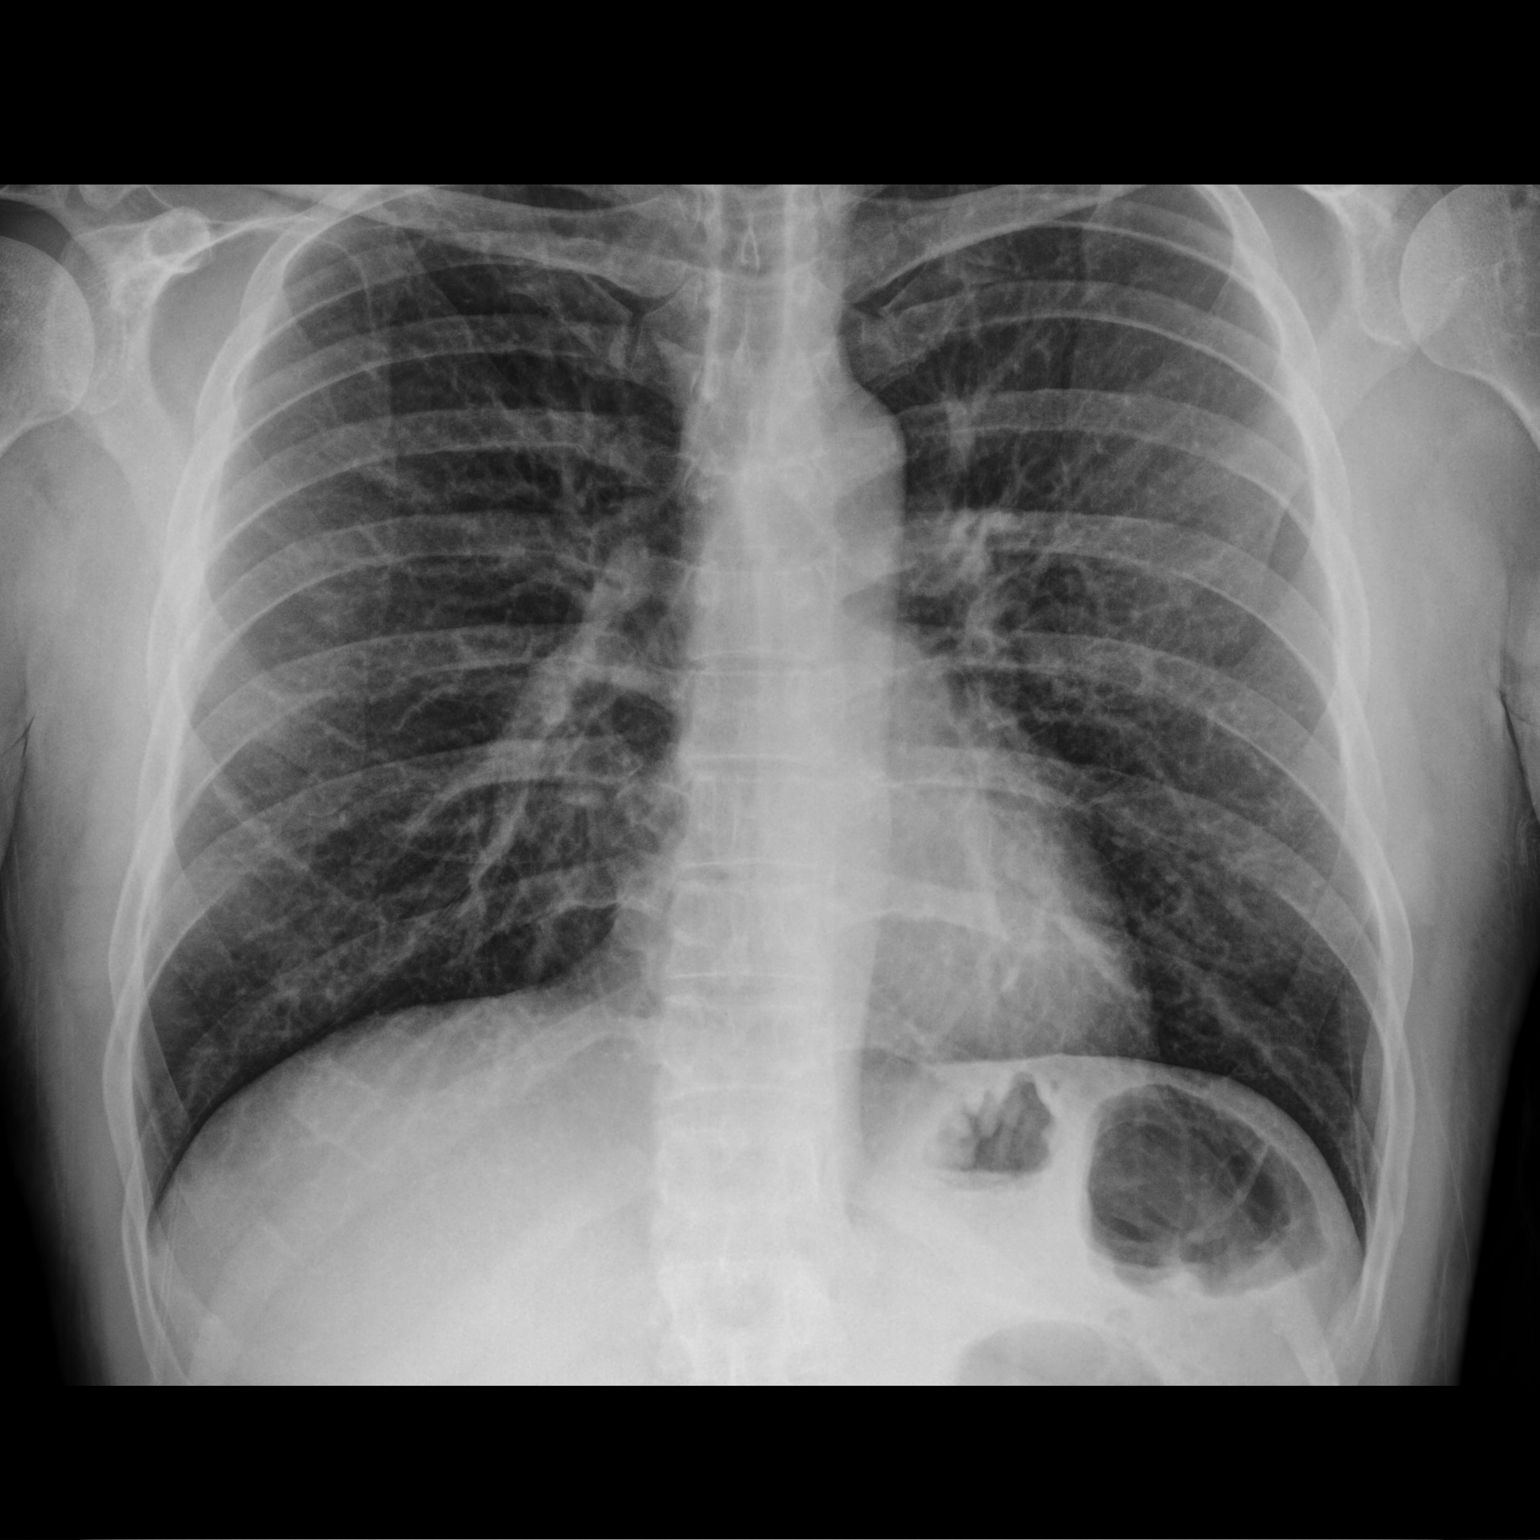

[chest lat]
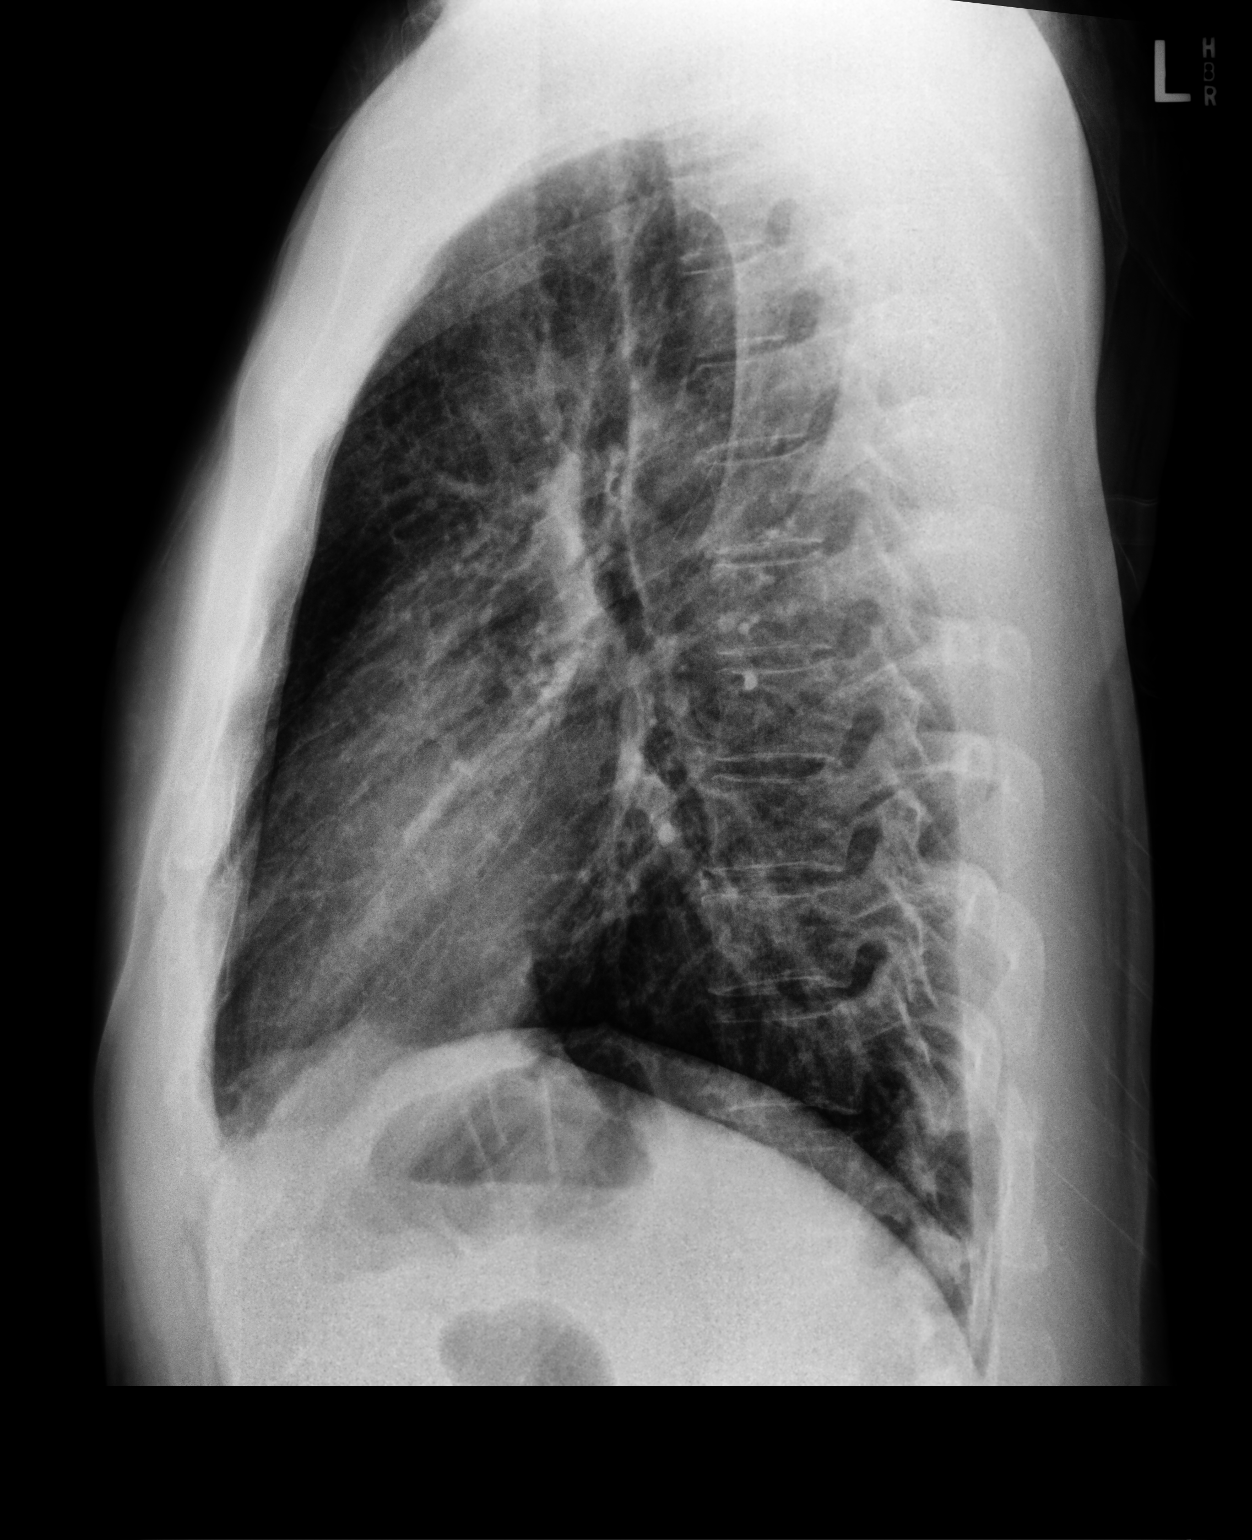

[2 of 2 positions shown; findings below may reference images not displayed]

FINDINGS: The heart size and mediastinal contours are within normal limits.
Mild, diffuse interstitial opacity. The visualized skeletal
structures are unremarkable.
IMPRESSION: Mild, diffuse interstitial opacity, which may reflect atypical/viral
infection or edema. No focal airspace opacity.

## 2019-04-07 MED ORDER — TELMISARTAN 40 MG PO TABS: 40.0000 mg | ORAL_TABLET | Freq: Every day | ORAL | 11 refills | Status: DC

## 2019-04-07 NOTE — Patient Instructions (Addendum)
The key is to stop smoking completely before smoking completely stops you!  mucinex dm 1200 mg every 12 hours as needed for cough  Stop lisinopril and start micardis(telmisartan) 40 mg one daily   Please remember to go to the  x-ray department  for your tests - we will call you with the results when they are available    Please schedule a follow up office visit in 6 weeks, call sooner if needed

## 2019-04-07 NOTE — Assessment & Plan Note (Signed)
Trial off acei 04/07/2019 due to cough   ACE inhibitors are problematic in  pts with airway complaints because  even experienced pulmonologists can't always distinguish ace effects from copd/asthma.  By themselves they don't actually cause a problem, much like oxygen can't by itself start a fire, but they certainly serve as a powerful catalyst or enhancer for any "fire"  or inflammatory process in the upper airway, be it caused by an ET  tube or more commonly reflux (especially in the obese or pts with known GERD or who are on biphoshonates).    In the era of ARB near equivalency until we have a better handle on the reversibility of the airway problem, it just makes sense to avoid ACEI  entirely in the short run and then decide later, having established a level of airway control using a reasonable limited regimen, whether to add back ace but even then being very careful to observe the pt for worsening airway control and number of meds used/ needed to control symptoms.    >>>  Try micardis 40 mg one daily x 6 weeks then return to regroup.

## 2019-04-07 NOTE — Progress Notes (Addendum)
Ronald Banks, male    DOB: February 05, 1975, 44 y.o.   MRN: [id number]   Brief patient profile:  43  yowm active smoker never really much of an athlete but able to do yardwork with onset of sob/ cough about 2015-2016 ? When lisinopril started but much worse x early 2021 no better with prednisone/"inhaler"  so referred to pulmonary clinic 04/07/2019 by Dr   [name]     History of Present Illness  04/07/2019  Pulmonary/ 1st office eval/Axyl Sitzman  Chief Complaint  Patient presents with  . Pulmonary Consult    Referred by Dr [name]. Pt c/o for the past 2 months- prod in the am's with white sputum. He states sometimes cough will wake him in the night.   Dyspnea:  mb 113f  Flat sob/ dry cough  Cough: first thing in am and noct min am mucoid production s overt sinus complaints  Sleep: wakes up most hours bed flat one pillow SABA use: no better   No obvious day to day or daytime variability or assoc excess/ purulent sputum or mucus plugs or hemoptysis or cp or chest tightness, subjective wheeze or overt sinus or hb symptoms.   Sleeping  without nocturnal  or early am exacerbation  of respiratory  c/o's or need for noct saba. Also denies any obvious fluctuation of symptoms with weather or environmental changes or other aggravating or alleviating factors except as outlined above   No unusual exposure hx or h/o childhood pna/ asthma or knowledge of premature birth.  Current Allergies, Complete Past Medical History, Past Surgical History, Family History, and Social History were reviewed in [employer].  ROS  The following are not active complaints unless bolded Hoarseness, sore throat, dysphagia, dental problems, itching, sneezing,  nasal congestion or discharge of excess mucus or purulent secretions, ear ache,   fever, chills, sweats, unintended wt loss or wt gain, classically pleuritic or exertional cp,  orthopnea pnd or arm/hand swelling  or leg swelling, presyncope,  palpitations, abdominal pain, anorexia, nausea, vomiting, diarrhea  or change in bowel habits or change in bladder habits, change in stools or change in urine, dysuria, hematuria,  rash, arthralgias, visual complaints, headache, numbness, weakness or ataxia or problems with walking or coordination,  change in mood or  memory.           Past Medical History:  Diagnosis Date  . Diabetes mellitus without complication ([place]    type 1   . Hyperlipidemia   . Thyroid disease     Outpatient Medications Prior to Visit  Medication Sig Dispense Refill  . ACCU-CHEK FASTCLIX LANCETS MISC Use to check blood sugar up to 5 times daily. 102 each 11  . ACCU-CHEK GUIDE test strip USE TO TEST BLOOD SUGAR UP TO 5 TIMES DAILY 100 strip 11  . albuterol (VENTOLIN HFA) 108 (90 Base) MCG/ACT inhaler Inhale 2 puffs into the lungs every 6 (six) hours as needed for wheezing or shortness of breath. 18 g 3  . atorvastatin (LIPITOR) 40 MG tablet Take 1 tablet (40 mg total) by mouth at bedtime. 90 tablet 1  . baclofen (LIORESAL) 10 MG tablet Take 1 tablet (10 mg total) by mouth 3 (three) times daily as needed for muscle spasms. 90 tablet 1  . benzonatate (TESSALON) 100 MG capsule Take 2 capsules (200 mg total) by mouth 3 (three) times daily as needed. 40 capsule 0  . Blood Glucose Monitoring Suppl (ACCU-CHEK GUIDE) w/Device KIT 1 kit by Does not  apply route 5 (five) times daily. 1 kit 0  . gabapentin (NEURONTIN) 300 MG capsule TAKE 1 CAP AT BEDTIME MAY INCREASE TO TWICE A DAY AFTER 7 DAYS TO HELP WITH PAIN IN FEET/HANDS 60 capsule 3  . HUMULIN 70/30 (70-30) 100 UNIT/ML injection 47 UNITS IN AM& 40 UNITS BEFORE EVENING MEAL.LOWER NIGHT DOSE TO 35 UNITS IF BG 70 OR LESS OVERNIGHT 30 mL 1  . ibuprofen (ADVIL,MOTRIN) 600 MG tablet Take 600 mg by mouth every 6 (six) hours as needed.    . Insulin Detemir (LEVEMIR FLEXTOUCH) 100 UNIT/ML Pen Inject into the skin.    . Insulin Pen Needle (FIFTY50 PEN NEEDLES) 31G X 5 MM MISC Use as  directed    . INSULIN SYRINGE .5CC/29G (B-D INSULIN SYRINGE) 29G X 1/2" 0.5 ML MISC Use to inject insulin every day. 100 each 11  . levothyroxine (SYNTHROID, LEVOTHROID) 175 MCG tablet Take 1 tablet (175 mcg total) by mouth daily before breakfast. 30 tablet 5  . lisinopril (PRINIVIL,ZESTRIL) 5 MG tablet Take 1 tablet (5 mg total) by mouth daily. 1/2 TABLET daily 45 tablet 3  . methocarbamol (ROBAXIN) 500 MG tablet Take 2 tablets (1,000 mg total) by mouth 3 (three) times daily. As needed for muscle spasm 90 tablet 0  . NOVOLOG FLEXPEN 100 UNIT/ML FlexPen INJECT 15 UNITS SUBCUTANEOUSLY 3TIMES DAILY W/MEALS. ADD SLIDING SCALE IF SUGAR 100. MAX 75UNITS/DAY    . rosuvastatin (CRESTOR) 40 MG tablet Take 40 mg by mouth daily.    . sildenafil (VIAGRA) 100 MG tablet Take 0.5-1 tablets (50-100 mg total) by mouth daily as needed for erectile dysfunction. 5 tablet 5  . traMADol (ULTRAM) 50 MG tablet 1 po q hs prn pain 30 tablet 0  . doxycycline (VIBRA-TABS) 100 MG tablet Take 1 tablet (100 mg total) by mouth 2 (two) times daily. 20 tablet 0  . DULoxetine (CYMBALTA) 60 MG capsule Take 1 capsule (60 mg total) by mouth daily. 30 capsule 1  . omeprazole (PRILOSEC) 20 MG capsule Take 1 capsule (20 mg total) by mouth daily. (Patient not taking: Reported on 02/20/2019) 30 capsule 3  . predniSONE (DELTASONE) 20 MG tablet finished  0  . traMADol (ULTRAM) 50 MG tablet Take 50 mg by mouth every 8 (eight) hours as needed.    . venlafaxine XR (EFFEXOR-XR) 75 MG 24 hr capsule TAKE 1 CAPSULE (75 MG TOTAL) BY MOUTH DAILY WITH BREAKFAST. 30 capsule 2      Objective:     BP 118/66 (BP Location: Left Arm, Cuff Size: Normal)   Pulse 82   Temp (!) 97.1 F (36.2 C) (Temporal)   Ht 5' 10"  (1.778 m)   Wt 199 lb (90.3 kg)   SpO2 98% Comment: on RA  BMI 28.55 kg/m   SpO2: 98 %(on RA)  Stoic amb wm nad/ very poor grasp of details of names of  meds/medical care   HEENT : pt wearing mask not removed for exam due to covid  -19 concerns.    NECK :  without JVD/Nodes/TM/ nl carotid upstrokes bilaterally   LUNGS: no acc muscle use,  Nl contour chest which is clear to A and P bilaterally without cough on insp or exp maneuvers   CV:  RRR  no s3 or murmur or increase in P2, and no edema   ABD:  soft and nontender with nl inspiratory excursion in the supine position. No bruits or organomegaly appreciated, bowel sounds nl  MS:  Nl gait/ ext warm without deformities,  calf tenderness, cyanosis or clubbing No obvious joint restrictions   SKIN: warm and dry without lesions    NEURO:  alert, approp, nl sensorium with  no motor or cerebellar deficits apparent.     CXR PA and Lateral:   04/07/2019 :    I personally reviewed images and   impression as follows:    minimal non-specific increase markings/ min hyperinflation       Assessment   Upper airway cough syndrome Onset was around 2015/16 much worse Jan 2021  - try off acei 04/07/2019   Upper airway cough syndrome (previously labeled PNDS),  is so named because it's frequently impossible to sort out how much is  CR/sinusitis with freq throat clearing (which can be related to primary GERD)   vs  causing  secondary (" extra esophageal")  GERD from wide swings in gastric pressure that occur with throat clearing, often  promoting self use of mint and menthol lozenges that reduce the lower esophageal sphincter tone and exacerbate the problem further in a cyclical fashion.   These are the same pts (now being labeled as having "irritable larynx syndrome" by some cough centers) who not infrequently have a history of having failed to tolerate ace inhibitors,  dry powder inhalers or biphosphonates or report having atypical/extraesophageal reflux symptoms that don't respond to standard doses of PPI  and are easily confused as having aecopd or asthma flares by even experienced allergists/ pulmonologists (myself included).    >>> ACEi adverse effects at the  top of the  usual list of suspects and the only way to rule it out is a trial off x 6 weeks then regroup.     Essential hypertension Trial off acei 04/07/2019 due to cough   ACE inhibitors are problematic in  pts with airway complaints because  even experienced pulmonologists can't always distinguish ace effects from copd/asthma.  By themselves they don't actually cause a problem, much like oxygen can't by itself start a fire, but they certainly serve as a powerful catalyst or enhancer for any "fire"  or inflammatory process in the upper airway, be it caused by an ET  tube or more commonly reflux (especially in the obese or pts with known GERD or who are on biphoshonates).    In the era of ARB near equivalency until we have a better handle on the reversibility of the airway problem, it just makes sense to avoid ACEI  entirely in the short run and then decide later, having established a level of airway control using a reasonable limited regimen, whether to add back ace but even then being very careful to observe the pt for worsening airway control and number of meds used/ needed to control symptoms.    >>>  Try micardis 40 mg one daily x 6 weeks then return to regroup.    Cigarette smoker Counseled re importance of smoking cessation but did not meet time criteria for separate billing       I reviewed the Fletcher curve with the patient that basically indicates  if you quit smoking when your best day FEV1 is still well preserved (as is clearly  the case here)  it is highly unlikely you will progress to severe disease and informed the patient there was  no medication on the market that has proven to alter the curve/ its downward trajectory  or the likelihood of progression of their disease(unlike other chronic medical conditions such as atheroclerosis where we do think we can change the  natural hx with risk reducing meds)    Therefore stopping smoking and maintaining abstinence are  the most important aspects  of his care, not choice of inhalers.   Treatment other than smoking cessation  is entirely directed by severity of symptoms and focused also on reducing exacerbations, not attempting to change the natural history of the disease.          Each maintenance medication was reviewed in detail including emphasizing most importantly the difference between maintenance and prns and under what circumstances the prns are to be triggered using an action plan format where appropriate.  Total time for H and P, chart review, counseling, teaching device and generating customized AVS unique to this initial office visit with high level MDM  / charting = 60 min           Christinia Gully, MD 04/07/2019

## 2019-04-07 NOTE — Assessment & Plan Note (Addendum)
Onset was around 2015/16 much worse Jan 2021  - try off acei 04/07/2019   Upper airway cough syndrome (previously labeled PNDS),  is so named because it's frequently impossible to sort out how much is  CR/sinusitis with freq throat clearing (which can be related to primary GERD)   vs  causing  secondary (" extra esophageal")  GERD from wide swings in gastric pressure that occur with throat clearing, often  promoting self use of mint and menthol lozenges that reduce the lower esophageal sphincter tone and exacerbate the problem further in a cyclical fashion.   These are the same pts (now being labeled as having "irritable larynx syndrome" by some cough centers) who not infrequently have a history of having failed to tolerate ace inhibitors,  dry powder inhalers or biphosphonates or report having atypical/extraesophageal reflux symptoms that don't respond to standard doses of PPI  and are easily confused as having aecopd or asthma flares by even experienced allergists/ pulmonologists (myself included).    >>> ACEi adverse effects at the  top of the usual list of suspects and the only way to rule it out is a trial off x 6 weeks then regroup.

## 2019-04-07 NOTE — Assessment & Plan Note (Addendum)
Counseled re importance of smoking cessation but did not meet time criteria for separate billing      I reviewed the Fletcher curve with the patient that basically indicates  if you quit smoking when your best day FEV1 is still well preserved (as is clearly  the case here)  it is highly unlikely you will progress to severe disease and informed the patient there was  no medication on the market that has proven to alter the curve/ its downward trajectory  or the likelihood of progression of their disease(unlike other chronic medical conditions such as atheroclerosis where we do think we can change the natural hx with risk reducing meds)    Therefore stopping smoking and maintaining abstinence are  the most important aspects of his care, not choice of inhalers.   Treatment other than smoking cessation  is entirely directed by severity of symptoms and focused also on reducing exacerbations, not attempting to change the natural history of the disease.             Each maintenance medication was reviewed in detail including emphasizing most importantly the difference between maintenance and prns and under what circumstances the prns are to be triggered using an action plan format where appropriate.  Total time for H and P, chart review, counseling, teaching device and generating customized AVS unique to this initial office visit with high level MDM  / charting = 60 min

## 2019-04-08 ENCOUNTER — Telehealth: Payer: Self-pay

## 2019-04-08 NOTE — Progress Notes (Signed)
Spoke with pt and notified of results per Dr. Wert. Pt verbalized understanding and denied any questions. 

## 2019-04-08 NOTE — Telephone Encounter (Signed)
PA request received from [facility] on [address]  Drug requested: Telmisartan 14m CMM Key: n/[id number]- initiated through [employer] (medicaid) Tried/failed: losartan, lisinopril Covered alternatives:  PA request has been approved 04/08/2019 - 04/02/2020 PA#: [id number] aware.  Nothing further needed at this time- will close encounter.

## 2019-04-15 ENCOUNTER — Encounter: Payer: Self-pay | Admitting: Physical Medicine and Rehabilitation

## 2019-04-15 NOTE — Progress Notes (Signed)
Ronald Banks - 44 y.o. male MRN [id number]  Date of birth: [birth year]  Office Visit Note: Visit Date: 11/26/2018 PCP: [name], MD Referred by: [name], MD  Subjective: Chief Complaint  Patient presents with  . Lower Back - Pain   HPI: Ronald Banks is a 44 y.o. male who comes in today For new patient evaluation at the request of Dr. Anderson Malta for chronic worsening left-sided low back pain with radicular type pain in the leg.  I did review multiple notes in the chart from Dr. Marlou Sa as well as Dr. Floyde Parkins at [facility].  Patient does have type 1 diabetes with a history of polyneuropathy but no recent electrodiagnostic study.  He reports severe 8 out of 10 left-sided lower back pain in the posterior buttock and then left leg down to the foot.  He reports sitting up straight causes pain to increase.  He reports that leaning forward seems to help.  He has had some physical therapy and a lot of medication management without relief.  Dr. Marlou Sa did obtain MRI of the lumbar spine and this is reviewed with the patient today with spine models and imaging.  He really has a fairly good lumbar spine with only minimal degenerative facet arthropathy at L4-5.  There is no focal stenosis or compression.  No disc herniation.  Again his pain is constant and sharp.  It is left-sided not right-sided.  He has not noted any focal weakness or specific trauma.  He said no prior lumbar surgery.  Review of Systems  Musculoskeletal: Positive for back pain and joint pain.       Left leg pain  All other systems reviewed and are negative.  Otherwise per HPI.  Assessment & Plan: Visit Diagnoses:  1. Chronic left-sided low back pain with left-sided sciatica   2. Spondylosis without myelopathy or radiculopathy, lumbar region   3. Diabetic peripheral neuropathy associated with type 1 diabetes mellitus (HCC)     Plan: Findings:  Chronic worsening severe low back pain left predominant pain  with referral in the buttock hip and leg to the foot and more of an L5 distribution.  He does have mild facet arthropathy on MRI imaging.  We know that there is not necessarily a great correlation per se with imaging and pain findings.  He does not have anything nerve wise if you would expect radiculopathy.  This could be facet joint syndrome with referred pain from the facet joint but this is typically seen with the more severe arthritis.  Alternatively he could have sacroiliac joint dysfunction with referred pain but again usually in a male that is not usually something that we see a lot of and his symptoms just do not seem to fit with that as much.  At this point I think it is worth trying a facet joint block diagnostically the patient does want to do that.  We talked about monitoring blood sugar after the injection.  If it did give him relief at least we could pursue radiofrequency ablation at some point.  If it does not give him relief would consider one-time epidural injection from a transforaminal approach along the specific L5 nerve root just to see if that would be beneficial.  Otherwise may want to obtain electrodiagnostic study with Dr. Jannifer Franklin.    Meds & Orders: No orders of the defined types were placed in this encounter.  No orders of the defined types were placed in this encounter.  Follow-up: Return for Left L4-5 diagnostic facet injection.   Procedures: No procedures performed  No notes on file   Clinical History: EXAM: MRI LUMBAR SPINE WITHOUT CONTRAST  TECHNIQUE: Multiplanar, multisequence MR imaging of the lumbar spine was performed. No intravenous contrast was administered.  COMPARISON:  Radiography 02/19/2018  FINDINGS: Segmentation:  5 lumbar type vertebral bodies.  Alignment:  Normal  Vertebrae:  Normal  Conus medullaris and cauda equina: Conus extends to the L1 level. Conus and cauda equina appear normal.  Paraspinal and other soft tissues:  Normal  Disc levels:  No disc abnormality in the lumbar region. No degeneration, bulge or herniation. Very minimal facet osteoarthritis at the L4-5 level but without edematous change or encroachment upon neural spaces.  IMPRESSION: Negative examination of the lumbar spine. No stenosis of the canal or foramina. No disc pathology. Very minimal facet osteoarthritis at L4-5, probably subclinical.   Electronically Signed   By: Nelson Chimes M.D.   On: 11/02/2018 13:29   He reports that he has been smoking cigarettes. He has a 60.00 pack-year smoking history. He has never used smokeless tobacco.  Recent Labs    09/11/18 0954  HGBA1C 8.6*    Objective:  VS:  HT:    WT:   BMI:     BP:(!) 158/80  HR:63bpm  TEMP: ( )  RESP:  Physical Exam Vitals and nursing note reviewed.  Constitutional:      General: He is not in acute distress.    Appearance: Normal appearance. He is well-developed.  HENT:     Head: Normocephalic and atraumatic.  Eyes:     Conjunctiva/sclera: Conjunctivae normal.     Pupils: Pupils are equal, round, and reactive to light.  Cardiovascular:     Rate and Rhythm: Normal rate.     Pulses: Normal pulses.     Heart sounds: Normal heart sounds.  Pulmonary:     Effort: Pulmonary effort is normal. No respiratory distress.  Musculoskeletal:     Cervical back: Normal range of motion and neck supple. No rigidity.     Right lower leg: No edema.     Left lower leg: No edema.     Comments: Patient has some back pain with extension and facet loading but mild.  No focal trigger points in the paraspinal region there does seem to be some trigger points in the gluteus medius on the left.  Patient has negative Patrick's testing.  He has good distal strength.  Skin:    General: Skin is warm and dry.     Findings: No erythema or rash.  Neurological:     General: No focal deficit present.     Mental Status: He is alert and oriented to person, place, and time.      Sensory: No sensory deficit.     Coordination: Coordination normal.     Gait: Gait normal.  Psychiatric:        Mood and Affect: Mood normal.        Behavior: Behavior normal.     Ortho Exam Imaging: No results found.  Past Medical/Family/Surgical/Social History: Medications & Allergies reviewed per EMR, new medications updated. Patient Active Problem List   Diagnosis Date Noted  . Upper airway cough syndrome 04/07/2019  . Essential hypertension 04/07/2019  . Cigarette smoker 04/07/2019  . Diabetic peripheral neuropathy associated with type 1 diabetes mellitus ([place]) 02/11/2018  . Hyperlipidemia LDL goal <70 02/11/2018  . Hypothyroidism 02/11/2018  . Chronic right shoulder pain 02/11/2018  .  Chronic midline low back pain with right-sided sciatica 02/11/2018  . Anxiety 02/11/2018   Past Medical History:  Diagnosis Date  . Diabetes mellitus without complication ([place])    type 1   . Hyperlipidemia   . Thyroid disease    Family History  Problem Relation Age of Onset  . Diabetes Mother   . Cancer Mother        Brain, breast   . Diabetes Sister   . Diabetes Maternal Grandmother    Past Surgical History:  Procedure Laterality Date  . NO PAST SURGERIES     Social History   Occupational History  . Not on file  Tobacco Use  . Smoking status: Current Every Day Smoker    Packs/day: 2.00    Years: 30.00    Pack years: 60.00    Types: Cigarettes  . Smokeless tobacco: Never Used  Substance and Sexual Activity  . Alcohol use: Not Currently  . Drug use: Yes    Types: Marijuana    Comment: Several times per week  . Sexual activity: Yes

## 2019-04-15 NOTE — Procedures (Signed)
Lumbar Facet Joint Intra-Articular Injection(s) with Fluoroscopic Guidance  Patient: Ronald Banks      Date of Birth: [birth year] MRN: [id number] PCP: [name], MD      Visit Date: 12/09/2018   Universal Protocol:    Date/Time: 12/09/2018  Consent Given By: the patient  Position: PRONE   Additional Comments: Vital signs were monitored before and after the procedure. Patient was prepped and draped in the usual sterile fashion. The correct patient, procedure, and site was verified.   Injection Procedure Details:  Procedure Site One Meds Administered:  Meds ordered this encounter  Medications  . methylPREDNISolone acetate (DEPO-MEDROL) injection 80 mg     Laterality: Left  Location/Site:  L4-L5  Needle size: 22 guage  Needle type: Spinal  Needle Placement: Articular  Findings:  -Comments: Excellent flow of contrast producing a partial arthrogram.  Procedure Details: The fluoroscope beam is vertically oriented in AP, and the inferior recess is visualized beneath the lower pole of the inferior apophyseal process, which represents the target point for needle insertion. When direct visualization is difficult the target point is located at the medial projection of the vertebral pedicle. The region overlying each aforementioned target is locally anesthetized with a 1 to 2 ml. volume of 1% Lidocaine without Epinephrine.   The spinal needle was inserted into each of the above mentioned facet joints using biplanar fluoroscopic guidance. A 0.25 to 0.5 ml. volume of Isovue-250 was injected and a partial facet joint arthrogram was obtained. A single spot film was obtained of the resulting arthrogram.    One to 1.25 ml of the steroid/anesthetic solution was then injected into each of the facet joints noted above.   Additional Comments:  The patient tolerated the procedure well Dressing: 2 x 2 sterile gauze and Band-Aid    Post-procedure details: Patient was observed during the  procedure. Post-procedure instructions were reviewed.  Patient left the clinic in stable condition.

## 2019-04-15 NOTE — Progress Notes (Signed)
Ronald Banks - 44 y.o. male MRN [id number]  Date of birth: 02-04-1975  Office Visit Note: Visit Date: 12/09/2018 PCP: [name], MD Referred by: [name], MD  Subjective: Chief Complaint  Patient presents with  . Lower Back - Pain  . Left Leg - Pain   HPI:  Ronald Banks is a 44 y.o. male who comes in today For planned left L4-5 intra-articular facet joint block.  The patient has failed conservative care including home exercise, medications, time and activity modification.  This injection will be diagnostic and hopefully therapeutic.  Please see requesting physician notes for further details and justification.   ROS Otherwise per HPI.  Assessment & Plan: Visit Diagnoses:  1. Spondylosis without myelopathy or radiculopathy, lumbar region     Plan: No additional findings.   Meds & Orders:  Meds ordered this encounter  Medications  . methylPREDNISolone acetate (DEPO-MEDROL) injection 80 mg    Orders Placed This Encounter  Procedures  . Facet Injection  . XR C-ARM NO REPORT    Follow-up: Return if symptoms worsen or fail to improve.   Procedures: No procedures performed  Lumbar Facet Joint Intra-Articular Injection(s) with Fluoroscopic Guidance  Patient: Ronald Banks      Date of Birth: 1975/03/15 MRN: [id number] PCP: [name], MD      Visit Date: 12/09/2018   Universal Protocol:    Date/Time: 12/09/2018  Consent Given By: the patient  Position: PRONE   Additional Comments: Vital signs were monitored before and after the procedure. Patient was prepped and draped in the usual sterile fashion. The correct patient, procedure, and site was verified.   Injection Procedure Details:  Procedure Site One Meds Administered:  Meds ordered this encounter  Medications  . methylPREDNISolone acetate (DEPO-MEDROL) injection 80 mg     Laterality: Left  Location/Site:  L4-L5  Needle size: 22 guage  Needle type: Spinal  Needle Placement:  Articular  Findings:  -Comments: Excellent flow of contrast producing a partial arthrogram.  Procedure Details: The fluoroscope beam is vertically oriented in AP, and the inferior recess is visualized beneath the lower pole of the inferior apophyseal process, which represents the target point for needle insertion. When direct visualization is difficult the target point is located at the medial projection of the vertebral pedicle. The region overlying each aforementioned target is locally anesthetized with a 1 to 2 ml. volume of 1% Lidocaine without Epinephrine.   The spinal needle was inserted into each of the above mentioned facet joints using biplanar fluoroscopic guidance. A 0.25 to 0.5 ml. volume of Isovue-250 was injected and a partial facet joint arthrogram was obtained. A single spot film was obtained of the resulting arthrogram.    One to 1.25 ml of the steroid/anesthetic solution was then injected into each of the facet joints noted above.   Additional Comments:  The patient tolerated the procedure well Dressing: 2 x 2 sterile gauze and Band-Aid    Post-procedure details: Patient was observed during the procedure. Post-procedure instructions were reviewed.  Patient left the clinic in stable condition.     Clinical History: EXAM: MRI LUMBAR SPINE WITHOUT CONTRAST  TECHNIQUE: Multiplanar, multisequence MR imaging of the lumbar spine was performed. No intravenous contrast was administered.  COMPARISON:  Radiography 02/19/2018  FINDINGS: Segmentation:  5 lumbar type vertebral bodies.  Alignment:  Normal  Vertebrae:  Normal  Conus medullaris and cauda equina: Conus extends to the L1 level. Conus and cauda equina appear normal.  Paraspinal and other soft tissues:  Normal  Disc levels:  No disc abnormality in the lumbar region. No degeneration, bulge or herniation. Very minimal facet osteoarthritis at the L4-5 level but without edematous change or  encroachment upon neural spaces.  IMPRESSION: Negative examination of the lumbar spine. No stenosis of the canal or foramina. No disc pathology. Very minimal facet osteoarthritis at L4-5, probably subclinical.   Electronically Signed   By: Nelson Chimes M.D.   On: 11/02/2018 13:29     Objective:  VS:  HT:    WT:   BMI:     BP:135/81  HR:61bpm  TEMP: ( )  RESP:  Physical Exam  Ortho Exam Imaging: No results found.

## 2019-05-13 LAB — HM DIABETES EYE EXAM

## 2019-05-19 ENCOUNTER — Other Ambulatory Visit (INDEPENDENT_AMBULATORY_CARE_PROVIDER_SITE_OTHER): Payer: Medicaid Other

## 2019-05-19 ENCOUNTER — Encounter: Payer: Self-pay | Admitting: Family Medicine

## 2019-05-19 ENCOUNTER — Other Ambulatory Visit: Payer: Self-pay

## 2019-05-19 ENCOUNTER — Ambulatory Visit: Payer: Medicaid Other | Admitting: Internal Medicine

## 2019-05-19 ENCOUNTER — Ambulatory Visit (INDEPENDENT_AMBULATORY_CARE_PROVIDER_SITE_OTHER): Payer: Medicaid Other

## 2019-05-19 DIAGNOSIS — H35033 Hypertensive retinopathy, bilateral: Secondary | ICD-10-CM

## 2019-05-19 DIAGNOSIS — R0609 Other forms of dyspnea: Secondary | ICD-10-CM

## 2019-05-19 DIAGNOSIS — F17210 Nicotine dependence, cigarettes, uncomplicated: Secondary | ICD-10-CM

## 2019-05-19 DIAGNOSIS — R0600 Dyspnea, unspecified: Secondary | ICD-10-CM

## 2019-05-19 DIAGNOSIS — E1042 Type 1 diabetes mellitus with diabetic polyneuropathy: Secondary | ICD-10-CM

## 2019-05-19 DIAGNOSIS — E113393 Type 2 diabetes mellitus with moderate nonproliferative diabetic retinopathy without macular edema, bilateral: Secondary | ICD-10-CM

## 2019-05-19 DIAGNOSIS — I10 Essential (primary) hypertension: Secondary | ICD-10-CM

## 2019-05-19 DIAGNOSIS — R058 Other specified cough: Secondary | ICD-10-CM

## 2019-05-19 DIAGNOSIS — R05 Cough: Secondary | ICD-10-CM | POA: Diagnosis not present

## 2019-05-19 HISTORY — DX: Hypertensive retinopathy, bilateral: H35.033

## 2019-05-19 HISTORY — DX: Type 2 diabetes mellitus with moderate nonproliferative diabetic retinopathy without macular edema, bilateral: E11.3393

## 2019-05-19 HISTORY — DX: Other forms of dyspnea: R06.09

## 2019-05-19 HISTORY — DX: Dyspnea, unspecified: R06.00

## 2019-05-19 LAB — TSH: TSH: 1.45 u[IU]/mL (ref 0.35–4.50)

## 2019-05-19 LAB — BRAIN NATRIURETIC PEPTIDE: Pro B Natriuretic peptide (BNP): 51 pg/mL (ref 0.0–100.0)

## 2019-05-19 LAB — CBC WITH DIFFERENTIAL/PLATELET
Basophils Absolute: 0.1 10*3/uL (ref 0.0–0.1)
Basophils Relative: 0.6 % (ref 0.0–3.0)
Eosinophils Absolute: 0.1 10*3/uL (ref 0.0–0.7)
Eosinophils Relative: 1.2 % (ref 0.0–5.0)
HCT: 46 % (ref 39.0–52.0)
Hemoglobin: 15.8 g/dL (ref 13.0–17.0)
Lymphocytes Relative: 29.7 % (ref 12.0–46.0)
Lymphs Abs: 2.7 10*3/uL (ref 0.7–4.0)
MCHC: 34.4 g/dL (ref 30.0–36.0)
MCV: 94 fl (ref 78.0–100.0)
Monocytes Absolute: 0.5 10*3/uL (ref 0.1–1.0)
Monocytes Relative: 5.3 % (ref 3.0–12.0)
Neutro Abs: 5.7 10*3/uL (ref 1.4–7.7)
Neutrophils Relative %: 63.2 % (ref 43.0–77.0)
Platelets: 313 10*3/uL (ref 150.0–400.0)
RBC: 4.89 Mil/uL (ref 4.22–5.81)
RDW: 13 % (ref 11.5–15.5)
WBC: 9 10*3/uL (ref 4.0–10.5)

## 2019-05-19 LAB — BASIC METABOLIC PANEL
BUN: 7 mg/dL (ref 6–23)
CO2: 31 mEq/L (ref 19–32)
Calcium: 9.2 mg/dL (ref 8.4–10.5)
Chloride: 97 mEq/L (ref 96–112)
Creatinine, Ser: 0.94 mg/dL (ref 0.40–1.50)
GFR: 86.53 mL/min (ref >60.00)
Glucose, Bld: 114 mg/dL — ABNORMAL HIGH (ref 70–99)
Potassium: 4.5 mEq/L (ref 3.5–5.1)
Sodium: 132 mEq/L — ABNORMAL LOW (ref 135–145)

## 2019-05-19 LAB — SEDIMENTATION RATE: Sed Rate: 9 mm/hr (ref 0–15)

## 2019-05-19 LAB — CK: Total CK: 132 U/L (ref 7–232)

## 2019-05-19 LAB — SARS-COV-2 IGG: SARS-COV-2 IgG: 0.02

## 2019-05-19 IMAGING — DX DG CHEST 2V
2 series · 2 of 2 positions shown · non-contrast
Comparison: [DATE]

CLINICAL DATA: Cough and shortness of breath

EXAM:
CHEST - 2 VIEW

[chest pa]
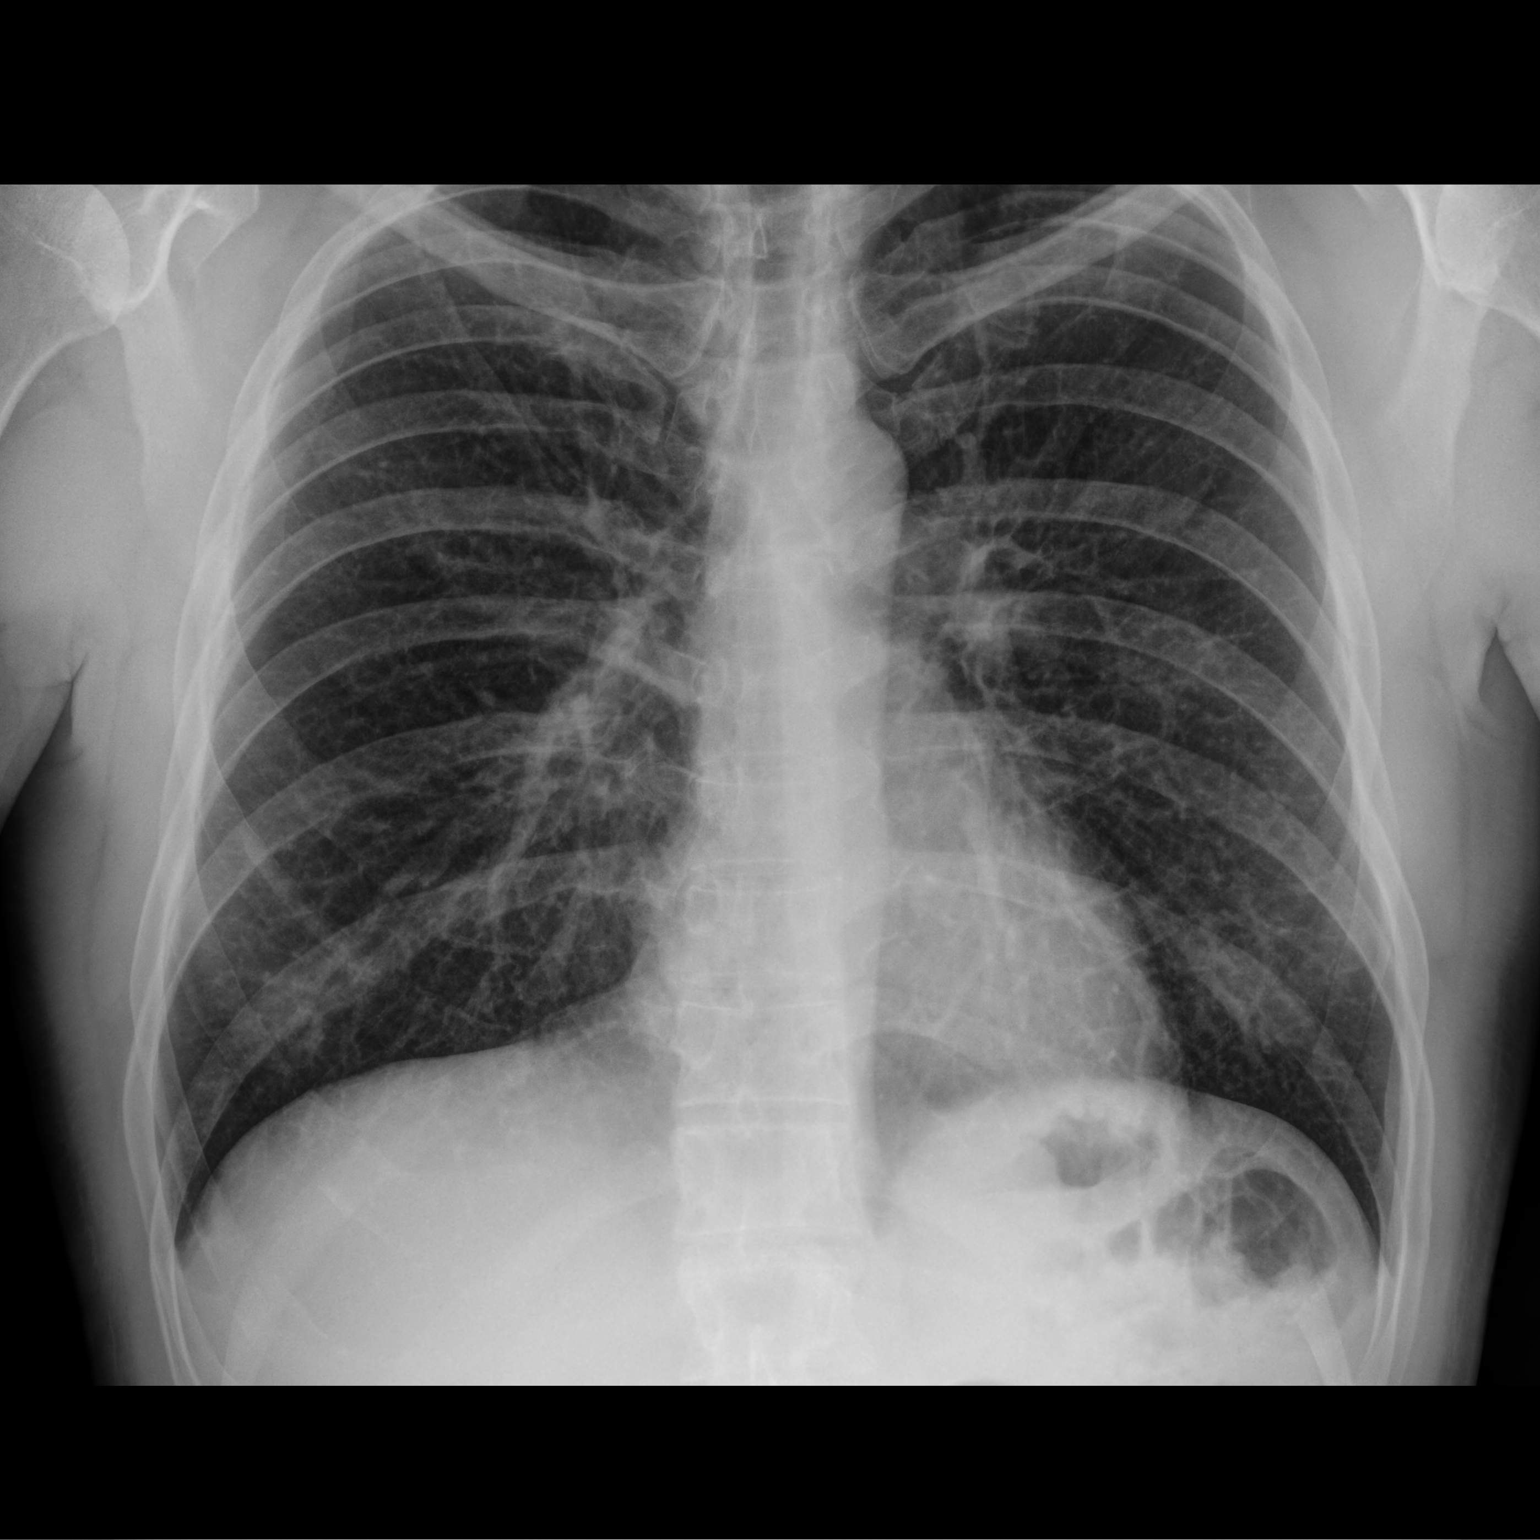

[chest lat]
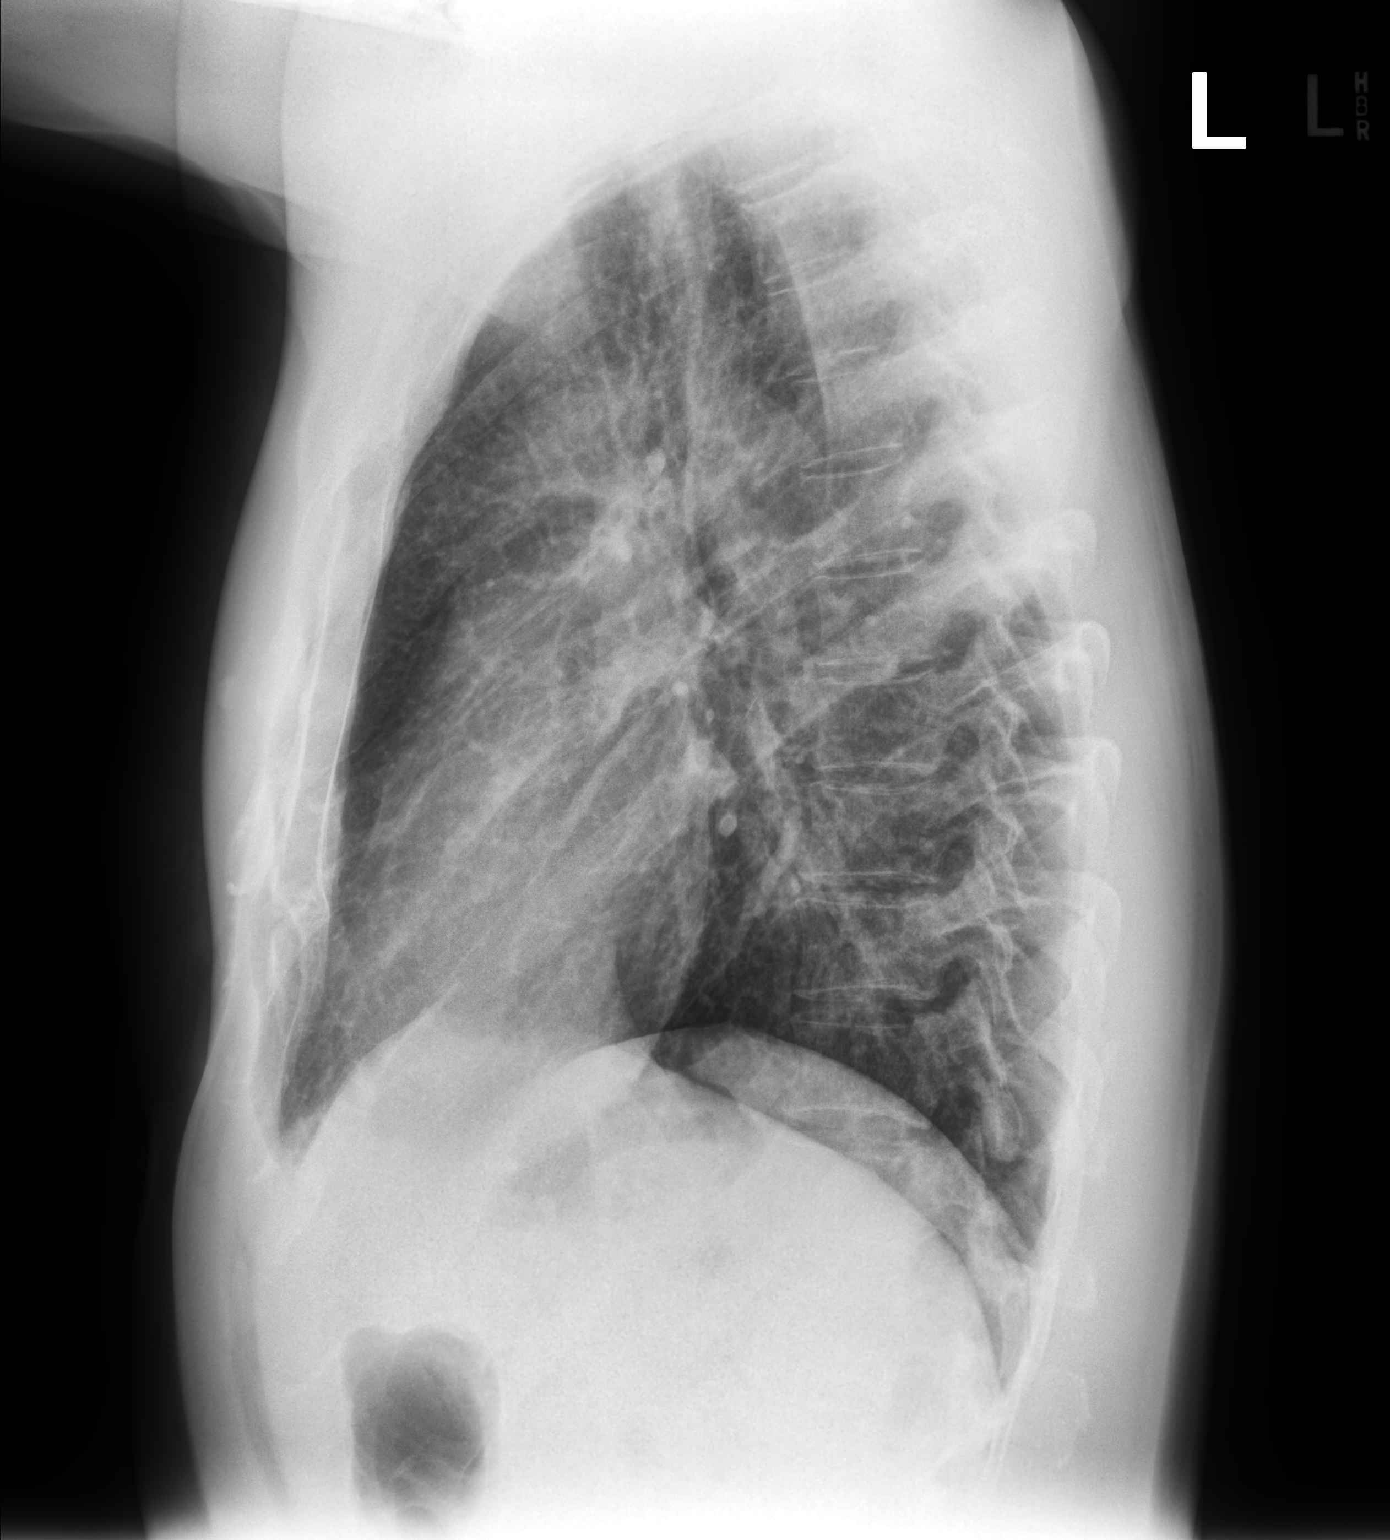

[2 of 2 positions shown; findings below may reference images not displayed]

FINDINGS: Cardiac shadow is stable. Previously seen interstitial density has
resolved in the interval. No focal infiltrate or sizable effusion is
seen. No bony abnormality is noted.
IMPRESSION: No active cardiopulmonary disease.

## 2019-05-19 MED ORDER — PANTOPRAZOLE SODIUM 40 MG PO TBEC: DELAYED_RELEASE_TABLET | ORAL | 2 refills | Status: DC

## 2019-05-19 MED ORDER — GABAPENTIN 300 MG PO CAPS: 300.0000 mg | ORAL_CAPSULE | Freq: Three times a day (TID) | ORAL | 3 refills | Status: DC

## 2019-05-19 NOTE — Patient Instructions (Addendum)
Hold lipitor x 2 weeks to see if muscle aches improves and ok to restart if no change   Increase gabapentin 300 three times a day      Stop prilosec and take protonix 40 mg Take 30- 60 min before your first and last meals of the day   GERD (REFLUX)  is an extremely common cause of respiratory symptoms just like yours , many times with no obvious heartburn at all.    It can be treated with medication, but also with lifestyle changes including elevation of the head of your bed (ideally with 6 -8inch blocks under the headboard of your bed),  Smoking cessation, avoidance of late meals, excessive alcohol, and avoid fatty foods, chocolate, peppermint, colas, red wine, and acidic juices such as orange juice.  NO MINT OR MENTHOL PRODUCTS SO NO COUGH DROPS  USE SUGARLESS CANDY INSTEAD (Jolley ranchers or Stover's or Life Savers) or even ice chips will also do - the key is to swallow to prevent all throat clearing. NO OIL BASED VITAMINS - use powdered substitutes.  Avoid fish oil when coughing.  The key is to stop smoking completely before smoking completely stops you!   Please remember to go to the lab ([facility]) and x-ray( Here)department   for your tests - we will call you with the results when they are available.  Please schedule a follow up office visit in 4 weeks, sooner if needed

## 2019-05-19 NOTE — Progress Notes (Signed)
Ronald Banks, male    DOB: [birth year], 44 y.o.   MRN: [id number]   Brief patient profile:  43  yowm active smoker never really much of an athlete but able to do yardwork with onset of sob/ cough about 2015-2016 ? When lisinopril started but much worse x early 2021 no better with prednisone/"inhaler"  so referred to pulmonary clinic 04/07/2019 by Dr   [name]     History of Present Illness  04/07/2019  Pulmonary/ 1st office eval/Ronald Banks  Chief Complaint  Patient presents with  . Pulmonary Consult    Referred by Dr [name]. Pt c/o for the past 2 months- prod in the am's with white sputum. He states sometimes cough will wake him in the night.   Dyspnea:  mb 143f  Flat sob/ dry cough  Cough: first thing in am and noct min am mucoid production s overt sinus complaints  Sleep: wakes up most hours bed flat one pillow SABA use: no better  rec The key is to stop smoking completely before smoking completely stops you! Mucinex dm 1200 mg every 12 hours as needed for cough Stop lisinopril and start micardis(telmisartan) 40 mg one daily  Please schedule a follow up office visit in 6 weeks, call sooner if needed    05/19/2019  f/u ov/Ronald Banks re: cough/ sob  Chief Complaint  Patient presents with  . Follow-up    Breathing is unchanged since the last visit. He is using his albuterol inhaler 2 x per day on average.   Dyspnea:  50 ft gives out  Cough: first thing in am some white mcus, rest of time just dry to point of vomit  Sleeping: very poor on side/ bed is flat / 2 pillows SABA use: twice daily no beter  02: none    No obvious day to day or daytime variability or assoc  purulent sputum or mucus plugs or hemoptysis or cp or chest tightness, subjective wheeze or overt sinus or hb symptoms.     Also denies any obvious fluctuation of symptoms with weather or environmental changes or other aggravating or alleviating factors except as outlined above   No unusual exposure hx or h/o childhood pna/  asthma or knowledge of premature birth.  Current Allergies, Complete Past Medical History, Past Surgical History, Family History, and Social History were reviewed in [employer] [facility].  ROS  The following are not active complaints unless bolded Hoarseness, sore throat, dysphagia, dental problems, itching, sneezing,  nasal congestion or discharge of excess mucus or purulent secretions, ear ache,   fever, chills, sweats, unintended wt loss or wt gain, classically pleuritic or exertional cp,  orthopnea pnd or arm/hand swelling  or leg swelling, presyncope, palpitations, abdominal pain, anorexia, nausea, vomiting, diarrhea  or change in bowel habits or change in bladder habits, change in stools or change in urine, dysuria, hematuria,  rash, arthralgias, visual complaints, headache, numbness, weakness or ataxia or problems with walking or coordination,  change in mood or  memory.        Current Meds - - NOTE:   Unable to verify as accurately reflecting what pt takes     Medication Sig  . ACCU-CHEK FASTCLIX LANCETS MISC Use to check blood sugar up to 5 times daily.  .Marland KitchenACCU-CHEK GUIDE test strip USE TO TEST BLOOD SUGAR UP TO 5 TIMES DAILY  . albuterol (VENTOLIN HFA) 108 (90 Base) MCG/ACT inhaler Inhale 2 puffs into the lungs every 6 (six) hours as needed for  wheezing or shortness of breath.  Marland Kitchen atorvastatin (LIPITOR) 40 MG tablet Take 1 tablet (40 mg total) by mouth at bedtime.  . baclofen (LIORESAL) 10 MG tablet Take 1 tablet (10 mg total) by mouth 3 (three) times daily as needed for muscle spasms.  . benzonatate (TESSALON) 100 MG capsule Take 2 capsules (200 mg total) by mouth 3 (three) times daily as needed.  . Blood Glucose Monitoring Suppl (ACCU-CHEK GUIDE) w/Device KIT 1 kit by Does not apply route 5 (five) times daily.  Marland Kitchen gabapentin (NEURONTIN) 300 MG capsule TAKE 1 CAP AT BEDTIME MAY INCREASE TO TWICE A DAY AFTER 7 DAYS TO HELP WITH PAIN IN FEET/HANDS  . HUMULIN 70/30  (70-30) 100 UNIT/ML injection 47 UNITS IN AM& 40 UNITS BEFORE EVENING MEAL.LOWER NIGHT DOSE TO 35 UNITS IF BG 70 OR LESS OVERNIGHT  . ibuprofen (ADVIL,MOTRIN) 600 MG tablet Take 600 mg by mouth every 6 (six) hours as needed.  . Insulin Pen Needle (FIFTY50 PEN NEEDLES) 31G X 5 MM MISC Use as directed  . INSULIN SYRINGE .5CC/29G (B-D INSULIN SYRINGE) 29G X 1/2" 0.5 ML MISC Use to inject insulin every day.  . levothyroxine (SYNTHROID, LEVOTHROID) 175 MCG tablet Take 1 tablet (175 mcg total) by mouth daily before breakfast.  . methocarbamol (ROBAXIN) 500 MG tablet Take 2 tablets (1,000 mg total) by mouth 3 (three) times daily. As needed for muscle spasm  . NOVOLOG FLEXPEN 100 UNIT/ML FlexPen INJECT 15 UNITS SUBCUTANEOUSLY 3TIMES DAILY W/MEALS. ADD SLIDING SCALE IF SUGAR 100. MAX 75UNITS/DAY  . omeprazole (PRILOSEC) 20 MG capsule TAKE 1 CAPSULE BY MOUTH EVERY DAY  . rosuvastatin (CRESTOR) 40 MG tablet Take 40 mg by mouth daily.  . sildenafil (VIAGRA) 100 MG tablet Take 0.5-1 tablets (50-100 mg total) by mouth daily as needed for erectile dysfunction.  Marland Kitchen telmisartan (MICARDIS) 40 MG tablet Take 1 tablet (40 mg total) by mouth daily.  . traMADol (ULTRAM) 50 MG tablet 1 po q hs prn pain           Past Medical History:  Diagnosis Date  . Diabetes mellitus without complication ([place])    type 1   . Hyperlipidemia   . Thyroid disease        Objective:    amb somber wm nad   Wt Readings from Last 3 Encounters:  05/19/19 197 lb (89.4 kg)  04/07/19 199 lb (90.3 kg)  11/06/18 218 lb (98.9 kg)     Vital signs reviewed - Note on arrival 02 sats  97% on RA   And  130/68     HEENT : pt wearing mask not removed for exam due to covid -19 concerns.    NECK :  without JVD/Nodes/TM/ nl carotid upstrokes bilaterally   LUNGS: no acc muscle use,  Nl contour chest which is clear to A and P bilaterally without cough on insp or exp maneuvers   CV:  RRR  no s3 or murmur or increase in P2, and no  edema   ABD:  soft and nontender with nl inspiratory excursion in the supine position. No bruits or organomegaly appreciated, bowel sounds nl  MS:  Nl gait/ ext warm without deformities, calf tenderness, cyanosis or clubbing No obvious joint restrictions   SKIN: warm and dry without lesions    NEURO:  alert, approp, nl sensorium with  no motor or cerebellar deficits apparent.     CXR PA and Lateral:   05/19/2019 :    I personally reviewed images and  agree with radiology impression as follows:     No active cardiopulmonary disease.    Labs ordered/ reviewed:      Chemistry      Component Value Date/Time   NA 132 (L) 05/19/2019 1528   NA 134 09/11/2018 0954   K 4.5 05/19/2019 1528   CL 97 05/19/2019 1528   CO2 31 05/19/2019 1528   BUN 7 05/19/2019 1528   BUN 7 09/11/2018 0954   CREATININE 0.94 05/19/2019 1528      Component Value Date/Time   CALCIUM 9.2 05/19/2019 1528   ALKPHOS 70 09/11/2018 0954   AST 24 09/11/2018 0954   ALT 15 09/11/2018 0954   BILITOT 0.4 09/11/2018 0954        Lab Results  Component Value Date   WBC 9.0 05/19/2019   HGB 15.8 05/19/2019   HCT 46.0 05/19/2019   MCV 94.0 05/19/2019   PLT 313.0 05/19/2019       EOS                                                               0.1                                   05/19/2019   No results found for: DDIMER    Lab Results  Component Value Date   TSH 1.45 05/19/2019     Lab Results  Component Value Date   PROBNP 51.0 05/19/2019       CPK                                                              132                                     05/19/2019     Lab Results  Component Value Date   ESRSEDRATE 9 05/19/2019   ESRSEDRATE 3 10/31/2018         Labs ordered 05/19/2019  :  allergy profile   alpha one AT phenotype                  Assessment

## 2019-05-20 ENCOUNTER — Encounter: Payer: Self-pay | Admitting: Internal Medicine

## 2019-05-20 NOTE — Progress Notes (Signed)
LMTCB

## 2019-05-20 NOTE — Assessment & Plan Note (Signed)
Counseled re importance of smoking cessation but did not meet time criteria for separate billing   °

## 2019-05-20 NOTE — Progress Notes (Signed)
Called and left detailed msg on machine ok per DPR.

## 2019-05-20 NOTE — Assessment & Plan Note (Signed)
Onset was around 2015/16 much worse Jan 2021  - try off acei 04/07/2019 > no better 05/19/2019 so rec max gerd rx/ gabapentin increase to 300 tid   Of the three most common causes of  Sub-acute / recurrent or chronic cough, only one (GERD)  can actually contribute to/ trigger  the other two (asthma and post nasal drip syndrome)  and perpetuate the cylce of cough.  While not intuitively obvious, many patients with chronic low grade reflux do not cough until there is a primary insult that disturbs the protective epithelial barrier and exposes sensitive nerve endings.   This is typically viral but can due to PNDS and  either may apply here.    >>>  The point is that once this occurs, it is difficult to eliminate the cycle  using anything but a maximally effective acid suppression regimen at least in the short run, accompanied by an appropriate diet to address non acid GERD and control / eliminate the cough itself with gabapentin to max of 300 qid to avoid narcotic use in chronic coughers.

## 2019-05-20 NOTE — Assessment & Plan Note (Signed)
Onset 2015/16 05/19/2019   Walked RA x two laps =  approx 560f @ brisk pace - stopped due to end of study with sats of 98 % at the end of the study and c/o sob s cp   Symptoms are markedly disproportionate to objective findings and not clear to what extent this is actually a pulmonary  problem but pt does appear to have difficult to sort out respiratory symptoms of unknown origin for which  DDX  = almost all start with A and  include Adherence, Ace Inhibitors, Acid Reflux, Active Sinus Disease, Alpha 1 Antitripsin deficiency, Anxiety masquerading as Airways dz,  ABPA,  Allergy(esp in young), Aspiration (esp in elderly), Adverse effects of meds,  Active smoking or Vaping, A bunch of PE's/clot burden (a few small clots can't cause this syndrome unless there is already severe underlying pulm or vascular dz with poor reserve),  Anemia or thyroid disorder, plus two Bs  = Bronchiectasis and Beta blocker use..and one C= CHF    Adherence is always the initial "prime suspect" and is a multilayered concern that requires a "trust but verify" approach in every patient - starting with knowing how to use medications, especially inhalers, correctly, keeping up with refills and understanding the fundamental difference between maintenance and prns vs those medications only taken for a very short course and then stopped and not refilled.  - return with all meds in hand using a trust but verify approach to confirm accurate Medication  Reconciliation The principal here is that until we are certain that the  patients are doing what we've asked, it makes no sense to ask them to do more.   ? Acid (or non-acid) GERD > always difficult to exclude as up to 75% of pts in some series report no assoc GI/ Heartburn symptoms> rec max (24h)  acid suppression and diet restrictions/ reviewed and instructions given in writing.   Active smoker - see sep a/p  ? Adverse drug effects > note on very high dose statins and aching in muscles but  cpk nl > rec 2 week statin holiday to see if related.  ? Anxiety/depression  > usually at the bottom of this list of usual suspects but should be   higher on this pt's based on H and P and  may interfere with adherence and also interpretation of response or lack thereof to symptom management which can be quite subjective.   Alpha one AT > check level  ? Allergy / ABPA> eos nl/ check IGE  ? Anemia/ thryoid dz > ruled out today  ? CHF > ruled out today   Next step is cpst         Each maintenance medication was reviewed in detail including emphasizing most importantly the difference between maintenance and prns and under what circumstances the prns are to be triggered using an action plan format where appropriate.  Total time for H and P, chart review, counseling,  directly observing portions of ambulatory 02 saturation study/  and generating customized AVS unique to this office visit / charting = 30 min

## 2019-05-20 NOTE — Assessment & Plan Note (Signed)
Trial off acei 04/07/2019 due to cough     Although even in retrospect it may not be clear the ACEi contributed to the pt's symptoms,    adding them back at this point or in the future would risk confusion in interpretation of non-specific respiratory symptoms to which this patient is prone  ie  Better not to muddy the waters here.    Adequate control on present rx, reviewed in detail with pt > no change in rx needed  > continue micardis 40 mg one daily

## 2019-05-25 ENCOUNTER — Other Ambulatory Visit: Payer: Self-pay | Admitting: Family Medicine

## 2019-05-26 ENCOUNTER — Other Ambulatory Visit: Payer: Self-pay | Admitting: Internal Medicine

## 2019-05-26 DIAGNOSIS — R0609 Other forms of dyspnea: Secondary | ICD-10-CM

## 2019-05-26 NOTE — Progress Notes (Signed)
Spoke with pt and notified of results per Dr. Wert. Pt verbalized understanding and denied any questions. 

## 2019-05-29 LAB — ALPHA-1 ANTITRYPSIN PHENOTYPE: A-1 Antitrypsin, Ser: 152 mg/dL (ref 83–199)

## 2019-05-29 LAB — IGE: IgE (Immunoglobulin E), Serum: 14 kU/L (ref <=114)

## 2019-06-13 ENCOUNTER — Other Ambulatory Visit: Admission: RE | Admit: 2019-06-13 | Payer: Medicaid Other | Source: Ambulatory Visit

## 2019-06-17 ENCOUNTER — Encounter (HOSPITAL_COMMUNITY): Payer: Medicaid Other

## 2019-06-17 ENCOUNTER — Telehealth (HOSPITAL_COMMUNITY): Payer: Self-pay | Admitting: *Deleted

## 2019-06-17 NOTE — Telephone Encounter (Signed)
Contacted patient as he was a NS for covid testing prior to CPX. He stated he called dr. Gustavus Bryant office to change the appointment, however they are unable to make changes to the CPX schedule and I was not aware of this patient call. He was unable to make his COVID test due to another medical appointment that overlapped. We reschedule patient and he was made aware of CPX expectations. I will email patient instructions with appointment dates and times.    Ronald Martins, MS, ACSM-RCEP Clinical Exercise Physiologist

## 2019-06-20 ENCOUNTER — Ambulatory Visit: Payer: Medicaid Other | Admitting: Internal Medicine

## 2019-06-26 ENCOUNTER — Ambulatory Visit: Payer: Medicaid Other | Attending: Family Medicine | Admitting: Physician Assistant

## 2019-06-26 ENCOUNTER — Other Ambulatory Visit: Payer: Self-pay

## 2019-06-26 VITALS — BP 114/62 | HR 73 | Temp 97.7°F | Ht 70.0 in | Wt 192.0 lb

## 2019-06-26 DIAGNOSIS — E785 Hyperlipidemia, unspecified: Secondary | ICD-10-CM | POA: Insufficient documentation

## 2019-06-26 DIAGNOSIS — Z794 Long term (current) use of insulin: Secondary | ICD-10-CM | POA: Diagnosis not present

## 2019-06-26 DIAGNOSIS — E103293 Type 1 diabetes mellitus with mild nonproliferative diabetic retinopathy without macular edema, bilateral: Secondary | ICD-10-CM | POA: Insufficient documentation

## 2019-06-26 DIAGNOSIS — Z79899 Other long term (current) drug therapy: Secondary | ICD-10-CM | POA: Diagnosis not present

## 2019-06-26 DIAGNOSIS — H6691 Otitis media, unspecified, right ear: Secondary | ICD-10-CM | POA: Diagnosis not present

## 2019-06-26 DIAGNOSIS — E079 Disorder of thyroid, unspecified: Secondary | ICD-10-CM | POA: Insufficient documentation

## 2019-06-26 DIAGNOSIS — H6690 Otitis media, unspecified, unspecified ear: Secondary | ICD-10-CM

## 2019-06-26 DIAGNOSIS — Z79890 Hormone replacement therapy (postmenopausal): Secondary | ICD-10-CM | POA: Insufficient documentation

## 2019-06-26 DIAGNOSIS — R599 Enlarged lymph nodes, unspecified: Secondary | ICD-10-CM | POA: Insufficient documentation

## 2019-06-26 DIAGNOSIS — E1065 Type 1 diabetes mellitus with hyperglycemia: Secondary | ICD-10-CM | POA: Insufficient documentation

## 2019-06-26 DIAGNOSIS — R590 Localized enlarged lymph nodes: Secondary | ICD-10-CM | POA: Diagnosis not present

## 2019-06-26 LAB — GLUCOSE, POCT (MANUAL RESULT ENTRY): POC Glucose: 200 mg/dl — AB (ref 70–99)

## 2019-06-26 MED ORDER — IBUPROFEN 600 MG PO TABS: 600.0000 mg | ORAL_TABLET | Freq: Four times a day (QID) | ORAL | 0 refills | Status: DC | PRN

## 2019-06-26 MED ORDER — FLUTICASONE PROPIONATE 50 MCG/ACT NA SUSP: 2.0000 | Freq: Every day | NASAL | 6 refills | Status: DC

## 2019-06-26 MED ORDER — AMOXICILLIN 500 MG PO CAPS: 500.0000 mg | ORAL_CAPSULE | Freq: Three times a day (TID) | ORAL | 0 refills | Status: DC

## 2019-06-26 NOTE — Progress Notes (Signed)
Ronald Banks, is a 44 y.o. male  HLK:[id number]  TDS:[id number]  DOB - Jun 18, 1975  Subjective:  Chief Complaint and HPI: Ronald Banks is a 44 y.o. male here today with 2 week h/o on and off R ear pain and now lymph node in front of R ear is tender and enlarged.  No fever.  No URI s/sx.  Sees endocrine for DM  ROS:   Constitutional:  No f/c, No night sweats, No unexplained weight loss. EENT:  No vision changes, No blurry vision, No hearing changes. No mouth or throat problems Respiratory: No cough, No SOB Cardiac: No CP, no palpitations GI:  No abd pain, No N/V/D. GU: No Urinary s/sx Musculoskeletal: No joint pain Neuro: No headache, no dizziness, no motor weakness.  Skin: No rash Endocrine:  No polydipsia. No polyuria.  Psych: Denies SI/HI  No problems updated.  ALLERGIES: No Known Allergies  PAST MEDICAL HISTORY: Past Medical History:  Diagnosis Date  . Diabetes mellitus without complication ([place])    type 1   . Hyperlipidemia   . Hypertensive retinopathy of both eyes 05/19/2019   [facility] Exam done 05/13/2019  . Moderate nonproliferative diabetic retinopathy of both eyes ([place]) 05/19/2019  . Thyroid disease     MEDICATIONS AT HOME: Prior to Admission medications   Medication Sig Start Date End Date Taking? Authorizing Provider  ACCU-CHEK FASTCLIX LANCETS MISC Use to check blood sugar up to 5 times daily. 03/05/18  Yes Fulp, Cammie, MD  ACCU-CHEK GUIDE test strip USE TO TEST BLOOD SUGAR UP TO 5 TIMES DAILY 02/17/19  Yes Fulp, Cammie, MD  atorvastatin (LIPITOR) 40 MG tablet Take 1 tablet (40 mg total) by mouth at bedtime. 03/07/18  Yes Fulp, Cammie, MD  baclofen (LIORESAL) 10 MG tablet Take 1 tablet (10 mg total) by mouth 3 (three) times daily as needed for muscle spasms. 02/19/18  Yes Hilts, Legrand Como, MD  Blood Glucose Monitoring Suppl (ACCU-CHEK GUIDE) w/Device KIT 1 kit by Does not apply route 5 (five) times daily. 03/05/18  Yes Fulp, Cammie, MD  gabapentin  (NEURONTIN) 300 MG capsule Take 1 capsule (300 mg total) by mouth 3 (three) times daily. 05/19/19  Yes Tanda Rockers, MD  HUMULIN 70/30 (70-30) 100 UNIT/ML injection 47 UNITS IN AM& 40 UNITS BEFORE EVENING MEAL.LOWER NIGHT DOSE TO 35 UNITS IF BG 70 OR LESS OVERNIGHT 03/26/19  Yes Fulp, Cammie, MD  ibuprofen (ADVIL) 600 MG tablet Take 1 tablet (600 mg total) by mouth every 6 (six) hours as needed. 06/26/19  Yes Summer Parthasarathy, Dionne Bucy, PA-C  INSULIN SYRINGE .5CC/29G (B-D INSULIN SYRINGE) 29G X 1/2" 0.5 ML MISC USE TO INJECT INSULIN EVERY DAY. 05/28/19  Yes Fulp, Cammie, MD  levothyroxine (SYNTHROID, LEVOTHROID) 175 MCG tablet Take 1 tablet (175 mcg total) by mouth daily before breakfast. 02/11/18  Yes Fulp, Cammie, MD  methocarbamol (ROBAXIN) 500 MG tablet Take 2 tablets (1,000 mg total) by mouth 3 (three) times daily. As needed for muscle spasm 02/11/18  Yes Fulp, Cammie, MD  NOVOLOG FLEXPEN 100 UNIT/ML FlexPen INJECT 15 UNITS SUBCUTANEOUSLY 3TIMES DAILY W/MEALS. ADD SLIDING SCALE IF SUGAR 100. MAX 75UNITS/DAY 05/18/18  Yes [provider]  pantoprazole (PROTONIX) 40 MG tablet Take 30- 60 min before your first and last meals of the day 05/19/19  Yes Tanda Rockers, MD  sildenafil (VIAGRA) 100 MG tablet Take 0.5-1 tablets (50-100 mg total) by mouth daily as needed for erectile dysfunction. 02/20/19  Yes Fulp, Cammie, MD  traMADol (ULTRAM) 50  MG tablet 1 po q hs prn pain 10/09/18  Yes Dean, Tonna Corner, MD  amoxicillin (AMOXIL) 500 MG capsule Take 1 capsule (500 mg total) by mouth 3 (three) times daily. 06/26/19   Argentina Donovan, PA-C  fluticasone (FLONASE) 50 MCG/ACT nasal spray Place 2 sprays into both nostrils daily. 06/26/19   Argentina Donovan, PA-C  Insulin Detemir (LEVEMIR FLEXTOUCH) 100 UNIT/ML Pen Inject into the skin. 04/18/18 04/18/19  [provider]  telmisartan (MICARDIS) 40 MG tablet Take 1 tablet (40 mg total) by mouth daily. Patient not taking: Reported on 06/26/2019 04/07/19   Tanda Rockers, MD     Objective:  EXAM:   Vitals:   06/26/19 1418  BP: 114/62  Pulse: 73  Temp: 97.7 F (36.5 C)  TempSrc: Temporal  SpO2: 96%  Weight: 192 lb (87.1 kg)  Height: _0  (1.778 m)    General appearance : A&OX3. NAD. Non-toxic-appearing HEENT: Atraumatic and Normocephalic.  PERRLA. EOM intact.  TM WNL on L;  Full and bulging on R with erythema.  TTP along eustachian tube on R and preauricular node ~1cm on R that is TTP.  No other LN Mouth-MMM, post pharynx WNL w/o erythema, No PND. Neck: supple, no JVD. No cervical lymphadenopathy. No thyromegaly Chest/Lungs:  Breathing-non-labored, Good air entry bilaterally, breath sounds normal without rales, rhonchi, or wheezing  CVS: S1 S2 regular, no murmurs, gallops, rubs  Extremities: Bilateral Lower Ext shows no edema, both legs are warm to touch with = pulse throughout Neurology:  CN II-XII grossly intact, Non focal.   Psych:  TP linear. J/I WNL. Normal speech. Appropriate eye contact and affect.  Skin:  No Rash  Data Review Lab Results  Component Value Date   HGBA1C 8.6 (H) 09/11/2018   HGBA1C 9.6 (H) 02/11/2018     Assessment & Plan   1. Uncontrolled type 1 diabetes mellitus with hyperglycemia (HCC) Continue current regimen and see endocrine on schedule - Glucose (CBG)  2. Acute otitis media, unspecified otitis media type - amoxicillin (AMOXIL) 500 MG capsule; Take 1 capsule (500 mg total) by mouth 3 (three) times daily.  Dispense: 30 capsule; Refill: 0 - ibuprofen (ADVIL) 600 MG tablet; Take 1 tablet (600 mg total) by mouth every 6 (six) hours as needed.  Dispense: 40 tablet; Refill: 0  3. Preauricular adenopathy - fluticasone (FLONASE) 50 MCG/ACT nasal spray; Place 2 sprays into both nostrils daily.  Dispense: 16 g; Refill: 6 Ibuprofen and sudafed.    Patient have been counseled extensively about nutrition and exercise  Return in about 3 months (around 09/26/2019) for PCP;  chronic conditions.  Recheck in  2-3 weeks if LN is not resolved.    The patient was given clear instructions to go to ER or return to medical center if symptoms don't improve, worsen or new problems develop. The patient verbalized understanding. The patient was told to call to get lab results if they haven't heard anything in the next week.     Freeman Caldron, PA-C [facility], [place]   06/26/2019, 2:34 PMPatient ID: Ronald Banks, male   DOB: 10/14/1975, 44 y.o.   MRN: [id number]

## 2019-06-26 NOTE — Patient Instructions (Signed)
Get over the counter sudafed to help with the pressure behind your ear

## 2019-06-27 ENCOUNTER — Other Ambulatory Visit (HOSPITAL_COMMUNITY)
Admission: RE | Admit: 2019-06-27 | Discharge: 2019-06-27 | Disposition: A | Discharge status: Home / Self Care | Payer: Medicaid Other | Source: Ambulatory Visit | Attending: Internal Medicine | Admitting: Internal Medicine

## 2019-06-27 DIAGNOSIS — Z20822 Contact with and (suspected) exposure to covid-19: Secondary | ICD-10-CM | POA: Insufficient documentation

## 2019-06-27 DIAGNOSIS — Z01812 Encounter for preprocedural laboratory examination: Secondary | ICD-10-CM | POA: Diagnosis not present

## 2019-06-27 LAB — SARS CORONAVIRUS 2 (TAT 6-24 HRS): SARS Coronavirus 2: NEGATIVE

## 2019-06-30 ENCOUNTER — Other Ambulatory Visit: Payer: Self-pay

## 2019-06-30 ENCOUNTER — Ambulatory Visit (HOSPITAL_COMMUNITY): Payer: Medicaid Other | Attending: Internal Medicine

## 2019-06-30 ENCOUNTER — Other Ambulatory Visit (HOSPITAL_COMMUNITY): Payer: Self-pay | Admitting: *Deleted

## 2019-06-30 DIAGNOSIS — R0600 Dyspnea, unspecified: Secondary | ICD-10-CM | POA: Diagnosis present

## 2019-06-30 DIAGNOSIS — R0609 Other forms of dyspnea: Secondary | ICD-10-CM

## 2019-07-03 ENCOUNTER — Ambulatory Visit: Payer: Medicaid Other | Admitting: Internal Medicine

## 2019-07-03 ENCOUNTER — Encounter: Payer: Self-pay | Admitting: Internal Medicine

## 2019-07-03 ENCOUNTER — Other Ambulatory Visit: Payer: Self-pay

## 2019-07-03 DIAGNOSIS — R0609 Other forms of dyspnea: Secondary | ICD-10-CM

## 2019-07-03 DIAGNOSIS — R058 Other specified cough: Secondary | ICD-10-CM

## 2019-07-03 DIAGNOSIS — R05 Cough: Secondary | ICD-10-CM | POA: Diagnosis not present

## 2019-07-03 DIAGNOSIS — I10 Essential (primary) hypertension: Secondary | ICD-10-CM | POA: Diagnosis not present

## 2019-07-03 DIAGNOSIS — F17210 Nicotine dependence, cigarettes, uncomplicated: Secondary | ICD-10-CM

## 2019-07-03 DIAGNOSIS — R0600 Dyspnea, unspecified: Secondary | ICD-10-CM

## 2019-07-03 NOTE — Progress Notes (Signed)
Ronald Banks, male    DOB: 1975-07-11, 44 y.o.   MRN: [id number]   Brief patient profile:  43  yowm active smoker never really much of an athlete but able to do yardwork with onset of sob/ cough about 2015-2016 ? When lisinopril started but much worse x early 2021 no better with prednisone/"inhaler"  so referred to pulmonary clinic 04/07/2019 by Dr   [name]     History of Present Illness  04/07/2019  Pulmonary/ 1st office eval/Altovise Wahler  Chief Complaint  Patient presents with  . Pulmonary Consult    Referred by Dr [name]. Pt c/o for the past 2 months- prod in the am's with white sputum. He states sometimes cough will wake him in the night.   Dyspnea:  mb 166f  Flat sob/ dry cough  Cough: first thing in am and noct min am mucoid production s overt sinus complaints  Sleep: wakes up most hours bed flat one pillow SABA use: no better  rec The key is to stop smoking completely before smoking completely stops you! Mucinex dm 1200 mg every 12 hours as needed for cough Stop lisinopril and start micardis(telmisartan) 40 mg one daily  Please schedule a follow up office visit in 6 weeks, call sooner if needed    05/19/2019  f/u ov/Maryalyce Sanjuan re: cough/ sob  Chief Complaint  Patient presents with  . Follow-up    Breathing is unchanged since the last visit. He is using his albuterol inhaler 2 x per day on average.   Dyspnea:  50 ft gives out  Cough: first thing in am some white mcus, rest of time just dry to point of vomit  Sleeping: very poor on side/ bed is flat / 2 pillows SABA use: twice daily no beter  02: none  rec Hold lipitor x 2 weeks to see if muscle aches improves and ok to restart if no change> no change in aches, esp back  Increase gabapentin 300 three times a day > twice daily   Stop prilosec and take protonix 40 mg Take 30- 60 min before your first and last meals of the day  GERD diet    >>> CPST  06/30/19  FEV1 3.79 (93%) with ratio 0.73 and nl f/v loop: ex study  probabably  wnl  when wt factored  In  - sob with ventilatory reserve suggested sub max effort and deconditioning.     07/03/2019  Final f/u ov/Jiah Bari re: doe / cough / still smoking not vaccinated yet for covid 19 / aches no better off lipitor  Chief Complaint  Patient presents with  . Follow-up    Cough  Dyspnea:  Says still 50 ft  Cough: esp in am = white mucus x 2  Sleeping: flat one pillow SABA use: none  02: none    No obvious day to day or daytime variability or assoc purulent sputum or mucus plugs or hemoptysis or cp or chest tightness, subjective wheeze or overt sinus or hb symptoms.   Sleeping as above  without nocturnal    exacerbation  of respiratory  c/o's or need for noct saba. Also denies any obvious fluctuation of symptoms with weather or environmental changes or other aggravating or alleviating factors except as outlined above   No unusual exposure hx or h/o childhood pna/ asthma or knowledge of premature birth.  Current Allergies, Complete Past Medical History, Past Surgical History, Family History, and Social History were reviewed in [employer] Energyrecord.  ROS  The following are not active complaints unless bolded Hoarseness, sore throat, dysphagia, dental problems, itching, sneezing,  nasal congestion or discharge of excess mucus or purulent secretions, R ear ache,   fever, chills, sweats, unintended wt loss or wt gain, classically pleuritic or exertional cp,  orthopnea pnd or arm/hand swelling  or leg swelling, presyncope, palpitations, abdominal pain, anorexia, nausea, vomiting, diarrhea  or change in bowel habits or change in bladder habits, change in stools or change in urine, dysuria, hematuria,  rash, arthralgias/myalgias, visual complaints, headache, numbness, weakness or ataxia or problems with walking or coordination,  change in mood or  memory.        Current Meds  Medication Sig  . ACCU-CHEK FASTCLIX LANCETS MISC Use to check blood sugar up to 5 times  daily.  Marland Kitchen ACCU-CHEK GUIDE test strip USE TO TEST BLOOD SUGAR UP TO 5 TIMES DAILY  . amoxicillin (AMOXIL) 500 MG capsule Take 1 capsule (500 mg total) by mouth 3 (three) times daily.  . baclofen (LIORESAL) 10 MG tablet Take 1 tablet (10 mg total) by mouth 3 (three) times daily as needed for muscle spasms.  . Blood Glucose Monitoring Suppl (ACCU-CHEK GUIDE) w/Device KIT 1 kit by Does not apply route 5 (five) times daily.  . fluticasone (FLONASE) 50 MCG/ACT nasal spray Place 2 sprays into both nostrils daily.  Marland Kitchen gabapentin (NEURONTIN) 300 MG capsule Take 1 capsule (300 mg total) by mouth 3 (three) times daily.  Marland Kitchen HUMULIN 70/30 (70-30) 100 UNIT/ML injection 47 UNITS IN AM& 40 UNITS BEFORE EVENING MEAL.LOWER NIGHT DOSE TO 35 UNITS IF BG 70 OR LESS OVERNIGHT  . ibuprofen (ADVIL) 600 MG tablet Take 1 tablet (600 mg total) by mouth every 6 (six) hours as needed.  . INSULIN SYRINGE .5CC/29G (B-D INSULIN SYRINGE) 29G X 1/2" 0.5 ML MISC USE TO INJECT INSULIN EVERY DAY.  Marland Kitchen levothyroxine (SYNTHROID, LEVOTHROID) 175 MCG tablet Take 1 tablet (175 mcg total) by mouth daily before breakfast.  . methocarbamol (ROBAXIN) 500 MG tablet Take 2 tablets (1,000 mg total) by mouth 3 (three) times daily. As needed for muscle spasm  . NOVOLOG FLEXPEN 100 UNIT/ML FlexPen INJECT 15 UNITS SUBCUTANEOUSLY 3TIMES DAILY W/MEALS. ADD SLIDING SCALE IF SUGAR 100. MAX 75UNITS/DAY  . pantoprazole (PROTONIX) 40 MG tablet Take 30- 60 min before your first and last meals of the day  . rosuvastatin (CRESTOR) 40 MG tablet Take by mouth.  . sildenafil (VIAGRA) 100 MG tablet Take 0.5-1 tablets (50-100 mg total) by mouth daily as needed for erectile dysfunction.  Marland Kitchen telmisartan (MICARDIS) 40 MG tablet Take 1 tablet (40 mg total) by mouth daily.  . traMADol (ULTRAM) 50 MG tablet 1 po q hs prn pain               Past Medical History:  Diagnosis Date  . Diabetes mellitus without complication ([place])    type 1   . Hyperlipidemia   .  Thyroid disease        Objective:    amb somber wm nad   07/03/2019        193   05/19/19 197 lb (89.4 kg)  04/07/19 199 lb (90.3 kg)  11/06/18 218 lb (98.9 kg)    Vital signs reviewed  07/03/2019  - Note at rest 02 sats  96% on RA  And bp 122/70   HEENT : pt wearing mask not removed for exam due to covid -19 concerns.  R and L ear were nl   NECK :  without JVD/Nodes/TM/ nl carotid upstrokes bilaterally   LUNGS: no acc muscle use,  Nl contour chest which is clear to A and P bilaterally without cough on insp or exp maneuvers   CV:  RRR  no s3 or murmur or increase in P2, and no edema   ABD:  soft and nontender with nl inspiratory excursion in the supine position. No bruits or organomegaly appreciated, bowel sounds nl  MS:  Nl gait/ ext warm without deformities, calf tenderness, cyanosis or clubbing No obvious joint restrictions   SKIN: warm and dry without lesions    NEURO:  alert, approp, nl sensorium with  no motor or cerebellar deficits apparent.           Assessment

## 2019-07-03 NOTE — Patient Instructions (Addendum)
To get the most out of exercise, you need to be continuously aware that you are short of breath, but never out of breath, for 30 minutes daily. As you improve, it will actually be easier for you to do the same amount of exercise  in  30 minutes so always push to the level where you are short of breath.   The key is to stop smoking completely before smoking completely stops you!  I strongly recommend you get the covid 19 vaccine asap   Pulmonary follow up is as needed

## 2019-07-06 ENCOUNTER — Encounter: Payer: Self-pay | Admitting: Internal Medicine

## 2019-07-06 NOTE — Assessment & Plan Note (Addendum)
Trial off acei 04/07/2019 due to cough > improved by 07/03/2019 with just a typical smoker's rattle (no longer cough to point of gag/vomit)  In the setting of a chronic smoker's cough with above hx would avoid acei indefinitely with f/u by PCP and note excellent control on micardis 40 mg daily with all  input/  refills per PCP going forward

## 2019-07-06 NOTE — Assessment & Plan Note (Addendum)
Onset 2015/16 05/19/2019   Walked RA x two laps =  approx 511f @ brisk pace - stopped due to end of study with sats of 98 % at the end of the study and c/o sob s cp  > rec CPST next  - alpha one AT  Screen   05/19/19 MM  152  - CPST  06/30/19  FEV1 3.79 (93%) with ratio 0.73 and nl f/v loop: ex study  probabably  wnl when wt factored  In  - sob with ventilatory reserve suggested sub max effort and deconditioning.  Reviewed cpst and how to recondition but no further pulmonary f/u indicated in this setting   Advised: I spent extra time with pt today reviewing appropriate use of albuterol for prn use on exertion with the following points: 1) saba is for relief of sob that does not improve by walking a slower pace or resting but rather if the pt does not improve after trying this first. 2) If the pt is convinced, as many are, that saba helps recover from activity faster then it's easy to tell if this is the case by re-challenging : ie stop, take the inhaler, then p 5 minutes try the exact same activity (intensity of workload) that just caused the symptoms and see if they are substantially diminished or not after saba 3) if there is an activity that reproducibly causes the symptoms, try the saba 15 min before the activity on alternate days   If in fact the saba really does help, then fine to continue to use it prn but advised may need to look closer at the maintenance regimen being used to achieve better control of airways disease with exertion.

## 2019-07-06 NOTE — Assessment & Plan Note (Signed)
Onset was around 2015/16 much worse Jan 2021  - try off acei 04/07/2019 > no better 05/19/2019 so rec max gerd rx/ gabapentin increase to 300 tid  - Allergy profile 05/19/19  >  Eos 0.1/  IgE  14   Now typical smoker's rattle >  Only rx is d/c cigs

## 2019-07-06 NOTE — Assessment & Plan Note (Signed)
Counseled re importance of smoking cessation but did not meet time criteria for separate billing    Pt informed of the seriousness of COVID 19 infection as a direct risk to lung health  and safey and to close contacts and should continue to wear a facemask in public and minimize exposure to public locations but especially avoid any area or activity where non-close contacts are not observing distancing or wearing an appropriate face mask.  I strongly recommended vaccine asap.   Medical decision making was a moderate level of complexity in this case because of  two chronic conditions /diagnoses requiring extra time for  H and P, chart review, counseling, appropriate use of saba   and generating customized AVS unique to this office visit and charting.   Each maintenance medication was reviewed in detail including emphasizing most importantly the difference between maintenance and prns and under what circumstances the prns are to be triggered using an action plan format where appropriate. Please see avs for details which were reviewed in writing by both me and my nurse and patient given a written copy highlighted where appropriate with yellow highlighter for the patient's continued care at home along with an updated version of their medications.  Patient was asked to maintain medication reconciliation by comparing this list to the actual medications being used at home and to contact this office right away if there is a conflict or discrepancy.

## 2019-07-11 ENCOUNTER — Encounter: Payer: Self-pay | Admitting: Emergency Medicine

## 2019-07-11 ENCOUNTER — Ambulatory Visit
Admission: EM | Admit: 2019-07-11 | Discharge: 2019-07-11 | Disposition: A | Discharge status: Home / Self Care | Payer: Medicaid Other | Attending: Emergency Medicine | Admitting: Emergency Medicine

## 2019-07-11 ENCOUNTER — Other Ambulatory Visit: Payer: Self-pay

## 2019-07-11 DIAGNOSIS — I889 Nonspecific lymphadenitis, unspecified: Secondary | ICD-10-CM | POA: Diagnosis not present

## 2019-07-11 DIAGNOSIS — H6691 Otitis media, unspecified, right ear: Secondary | ICD-10-CM | POA: Diagnosis not present

## 2019-07-11 MED ORDER — DOXYCYCLINE HYCLATE 100 MG PO CAPS: 100.0000 mg | ORAL_CAPSULE | Freq: Two times a day (BID) | ORAL | 0 refills | Status: AC

## 2019-07-11 NOTE — ED Provider Notes (Signed)
[name]    CSN: [id number] Arrival date & time: 07/11/19  1038      History   Chief Complaint Chief Complaint  Patient presents with  . Abscess    HPI Ronald Banks is a 44 y.o. male.   Presents with a painful knot in front of his right ear x3 weeks.  He was seen by his PCP 2.5 weeks ago and treated with amoxicillin which he completed 3 days ago.  He states his ear is now painful and he has headaches.  He denies fever, chills, sore throat, cough, shortness of breath, vomiting, diarrhea, rash, or other symptoms.  The history is provided by the patient.    Past Medical History:  Diagnosis Date  . Diabetes mellitus without complication ([place])    type 1   . Hyperlipidemia   . Hypertensive retinopathy of both eyes 05/19/2019   [facility] Exam done 05/13/2019  . Moderate nonproliferative diabetic retinopathy of both eyes ([place]) 05/19/2019  . Thyroid disease     Patient Active Problem List   Diagnosis Date Noted  . Moderate nonproliferative diabetic retinopathy of both eyes ([place]) 05/19/2019  . Hypertensive retinopathy of both eyes 05/19/2019  . DOE (dyspnea on exertion) 05/19/2019  . Upper airway cough syndrome 04/07/2019  . Essential hypertension 04/07/2019  . Cigarette smoker 04/07/2019  . Diabetic peripheral neuropathy associated with type 1 diabetes mellitus ([place]) 02/11/2018  . Hyperlipidemia LDL goal <70 02/11/2018  . Hypothyroidism 02/11/2018  . Chronic right shoulder pain 02/11/2018  . Chronic midline low back pain with right-sided sciatica 02/11/2018  . Anxiety 02/11/2018    Past Surgical History:  Procedure Laterality Date  . NO PAST SURGERIES         Home Medications    Prior to Admission medications   Medication Sig Start Date End Date Taking? Authorizing Provider  ACCU-CHEK FASTCLIX LANCETS MISC Use to check blood sugar up to 5 times daily. 03/05/18  Yes Fulp, Cammie, MD  ACCU-CHEK GUIDE test strip USE TO TEST BLOOD SUGAR UP TO 5 TIMES  DAILY 02/17/19  Yes Fulp, Cammie, MD  baclofen (LIORESAL) 10 MG tablet Take 1 tablet (10 mg total) by mouth 3 (three) times daily as needed for muscle spasms. 02/19/18  Yes Hilts, Legrand Como, MD  Blood Glucose Monitoring Suppl (ACCU-CHEK GUIDE) w/Device KIT 1 kit by Does not apply route 5 (five) times daily. 03/05/18  Yes Fulp, Cammie, MD  fluticasone (FLONASE) 50 MCG/ACT nasal spray Place 2 sprays into both nostrils daily. 06/26/19  Yes Argentina Donovan, PA-C  gabapentin (NEURONTIN) 300 MG capsule Take 1 capsule (300 mg total) by mouth 3 (three) times daily. 05/19/19  Yes Tanda Rockers, MD  HUMULIN 70/30 (70-30) 100 UNIT/ML injection 47 UNITS IN AM& 40 UNITS BEFORE EVENING MEAL.LOWER NIGHT DOSE TO 35 UNITS IF BG 70 OR LESS OVERNIGHT 03/26/19  Yes Fulp, Cammie, MD  ibuprofen (ADVIL) 600 MG tablet Take 1 tablet (600 mg total) by mouth every 6 (six) hours as needed. 06/26/19  Yes McClung, Dionne Bucy, PA-C  INSULIN SYRINGE .5CC/29G (B-D INSULIN SYRINGE) 29G X 1/2" 0.5 ML MISC USE TO INJECT INSULIN EVERY DAY. 05/28/19  Yes Fulp, Cammie, MD  levothyroxine (SYNTHROID, LEVOTHROID) 175 MCG tablet Take 1 tablet (175 mcg total) by mouth daily before breakfast. 02/11/18  Yes Fulp, Cammie, MD  methocarbamol (ROBAXIN) 500 MG tablet Take 2 tablets (1,000 mg total) by mouth 3 (three) times daily. As needed for muscle spasm 02/11/18  Yes Fulp, Cammie,  MD  NOVOLOG FLEXPEN 100 UNIT/ML FlexPen INJECT 15 UNITS SUBCUTANEOUSLY 3TIMES DAILY W/MEALS. ADD SLIDING SCALE IF SUGAR 100. MAX 75UNITS/DAY 05/18/18  Yes [provider]  pantoprazole (PROTONIX) 40 MG tablet Take 30- 60 min before your first and last meals of the day 05/19/19  Yes Tanda Rockers, MD  rosuvastatin (CRESTOR) 40 MG tablet Take by mouth. 05/30/18  Yes [provider]  sildenafil (VIAGRA) 100 MG tablet Take 0.5-1 tablets (50-100 mg total) by mouth daily as needed for erectile dysfunction. 02/20/19  Yes Fulp, Cammie, MD  telmisartan (MICARDIS) 40 MG tablet Take  1 tablet (40 mg total) by mouth daily. 04/07/19  Yes Tanda Rockers, MD  doxycycline (VIBRAMYCIN) 100 MG capsule Take 1 capsule (100 mg total) by mouth 2 (two) times daily for 10 days. 07/11/19 07/21/19  Sharion Balloon, NP  Insulin Detemir (LEVEMIR FLEXTOUCH) 100 UNIT/ML Pen Inject into the skin. 04/18/18 04/18/19  [provider]  traMADol Veatrice Bourbon) 50 MG tablet 1 po q hs prn pain 10/09/18   Meredith Pel, MD    Family History Family History  Problem Relation Age of Onset  . Diabetes Mother   . Cancer Mother        Brain, breast   . Diabetes Sister   . Diabetes Maternal Grandmother     Social History Social History   Tobacco Use  . Smoking status: Current Every Day Smoker    Packs/day: 1.00    Years: 30.00    Pack years: 30.00    Types: Cigarettes  . Smokeless tobacco: Never Used  Vaping Use  . Vaping Use: Never used  Substance Use Topics  . Alcohol use: Not Currently  . Drug use: Yes    Types: Marijuana    Comment: Several times per week     Allergies   Patient has no known allergies.   Review of Systems Review of Systems  Constitutional: Negative for chills and fever.  HENT: Positive for ear pain. Negative for congestion, rhinorrhea and sore throat.   Eyes: Negative for pain and visual disturbance.  Respiratory: Negative for cough and shortness of breath.   Cardiovascular: Negative for chest pain and palpitations.  Gastrointestinal: Negative for abdominal pain, diarrhea, nausea and vomiting.  Genitourinary: Negative for dysuria and hematuria.  Musculoskeletal: Negative for arthralgias and back pain.  Skin: Negative for color change and rash.  Neurological: Positive for headaches. Negative for seizures, syncope, weakness and numbness.  All other systems reviewed and are negative.    Physical Exam Triage Vital Signs ED Triage Vitals  Enc Vitals Group     BP 07/11/19 1049 (!) 110/57     Pulse Rate 07/11/19 1049 82     Resp 07/11/19 1049 16      Temp 07/11/19 1049 98.2 F (36.8 C)     Temp Source 07/11/19 1049 Oral     SpO2 07/11/19 1049 100 %     Weight --      Height --      Head Circumference --      Peak Flow --      Pain Score 07/11/19 1041 6     Pain Loc --      Pain Edu? --      Excl. in [place]? --    No data found.  Updated Vital Signs BP (!) 110/57 (BP Location: Left Arm)   Pulse 82   Temp 98.2 F (36.8 C) (Oral)   Resp 16   SpO2 100%  Visual Acuity Right Eye Distance:   Left Eye Distance:   Bilateral Distance:    Right Eye Near:   Left Eye Near:    Bilateral Near:     Physical Exam Vitals and nursing note reviewed.  Constitutional:      General: He is not in acute distress.    Appearance: He is well-developed. He is not ill-appearing.  HENT:     Head: Normocephalic and atraumatic.     Right Ear: Ear canal normal. Tympanic membrane is erythematous.     Left Ear: Tympanic membrane and ear canal normal.     Nose: Nose normal.     Mouth/Throat:     Mouth: Mucous membranes are moist.     Pharynx: Oropharynx is clear.  Eyes:     Conjunctiva/sclera: Conjunctivae normal.  Neck:     Comments: The auricular lymph node tender to palpation.  No other cervical lymphadenopathy.   Cardiovascular:     Rate and Rhythm: Normal rate and regular rhythm.     Heart sounds: No murmur heard.   Pulmonary:     Effort: Pulmonary effort is normal. No respiratory distress.     Breath sounds: Normal breath sounds.  Abdominal:     Palpations: Abdomen is soft.     Tenderness: There is no abdominal tenderness. There is no guarding or rebound.  Musculoskeletal:     Cervical back: Neck supple.  Skin:    General: Skin is warm and dry.     Findings: No rash.  Neurological:     General: No focal deficit present.     Mental Status: He is alert and oriented to person, place, and time.     Gait: Gait normal.  Psychiatric:        Mood and Affect: Mood normal.        Behavior: Behavior normal.      UC Treatments /  Results  Labs (all labs ordered are listed, but only abnormal results are displayed) Labs Reviewed - No data to display  EKG   Radiology No results found.  Procedures Procedures (including critical care time)  Medications Ordered in UC Medications - No data to display  Initial Impression / Assessment and Plan / UC Course  I have reviewed the triage vital signs and the nursing notes.  Pertinent labs & imaging results that were available during my care of the patient were reviewed by me and considered in my medical decision making (see chart for details).   Right otitis media, preauricular lymphadenitis.  Treating with doxycycline x10 days.  Discussed with patient that if his lymph node does not reduce and resolve, he will need to follow-up with his PCP or a surgeon.  Patient states he has an appointment scheduled with his PCP in July.  Patient agrees to plan of care.      Final Clinical Impressions(s) / UC Diagnoses   Final diagnoses:  Right otitis media, unspecified otitis media type  Preauricular lymphadenitis     Discharge Instructions     Take the doxycycline as directed.    Follow-up with your primary care provider as scheduled in July.  Follow-up sooner if your symptoms are not improving.      ED Prescriptions    Medication Sig Dispense Auth. Provider   doxycycline (VIBRAMYCIN) 100 MG capsule Take 1 capsule (100 mg total) by mouth 2 (two) times daily for 10 days. 20 capsule Sharion Balloon, NP     PDMP not reviewed this encounter.  Sharion Balloon, NP 07/11/19 1115

## 2019-07-11 NOTE — Discharge Instructions (Addendum)
Take the doxycycline as directed.    Follow-up with your primary care provider as scheduled in July.  Follow-up sooner if your symptoms are not improving.

## 2019-07-11 NOTE — ED Triage Notes (Signed)
Pt c/o bump on right ear. Pt states he saw his PCP 2.5 weeks ago and was given an antibiotic (amoxicillin 500 mg per patient) that he completed 3 days ago. Pt c/o frequent headaches and hearing changes in that ear. He states it is painful to the touch.

## 2019-07-30 ENCOUNTER — Encounter: Payer: Self-pay | Admitting: Physician Assistant

## 2019-07-30 ENCOUNTER — Ambulatory Visit (HOSPITAL_COMMUNITY)
Admission: RE | Admit: 2019-07-30 | Discharge: 2019-07-30 | Disposition: A | Discharge status: Home / Self Care | Payer: Medicaid Other | Source: Ambulatory Visit | Attending: Physician Assistant | Admitting: Physician Assistant

## 2019-07-30 ENCOUNTER — Ambulatory Visit (HOSPITAL_BASED_OUTPATIENT_CLINIC_OR_DEPARTMENT_OTHER): Payer: Medicaid Other | Admitting: Physician Assistant

## 2019-07-30 ENCOUNTER — Other Ambulatory Visit: Payer: Self-pay

## 2019-07-30 VITALS — BP 131/69 | HR 77 | Temp 98.4°F | Resp 16 | Wt 188.6 lb

## 2019-07-30 DIAGNOSIS — R590 Localized enlarged lymph nodes: Secondary | ICD-10-CM | POA: Diagnosis not present

## 2019-07-30 DIAGNOSIS — E1065 Type 1 diabetes mellitus with hyperglycemia: Secondary | ICD-10-CM

## 2019-07-30 DIAGNOSIS — M720 Palmar fascial fibromatosis [Dupuytren]: Secondary | ICD-10-CM | POA: Diagnosis present

## 2019-07-30 DIAGNOSIS — M25531 Pain in right wrist: Secondary | ICD-10-CM

## 2019-07-30 DIAGNOSIS — H6981 Other specified disorders of Eustachian tube, right ear: Secondary | ICD-10-CM

## 2019-07-30 DIAGNOSIS — H9191 Unspecified hearing loss, right ear: Secondary | ICD-10-CM

## 2019-07-30 LAB — GLUCOSE, POCT (MANUAL RESULT ENTRY): POC Glucose: 214 mg/dL — AB (ref 70–99)

## 2019-07-30 IMAGING — CR DG WRIST COMPLETE 3+V*R*
4 series · 4 of 4 positions shown · non-contrast
Comparison: None.

CLINICAL DATA: Right wrist pain and swelling. Pain onset 2 days
ago. No known injury. Remote injury 20 years ago.

EXAM:
RIGHT WRIST - COMPLETE 3+ VIEW

[wrist pa]
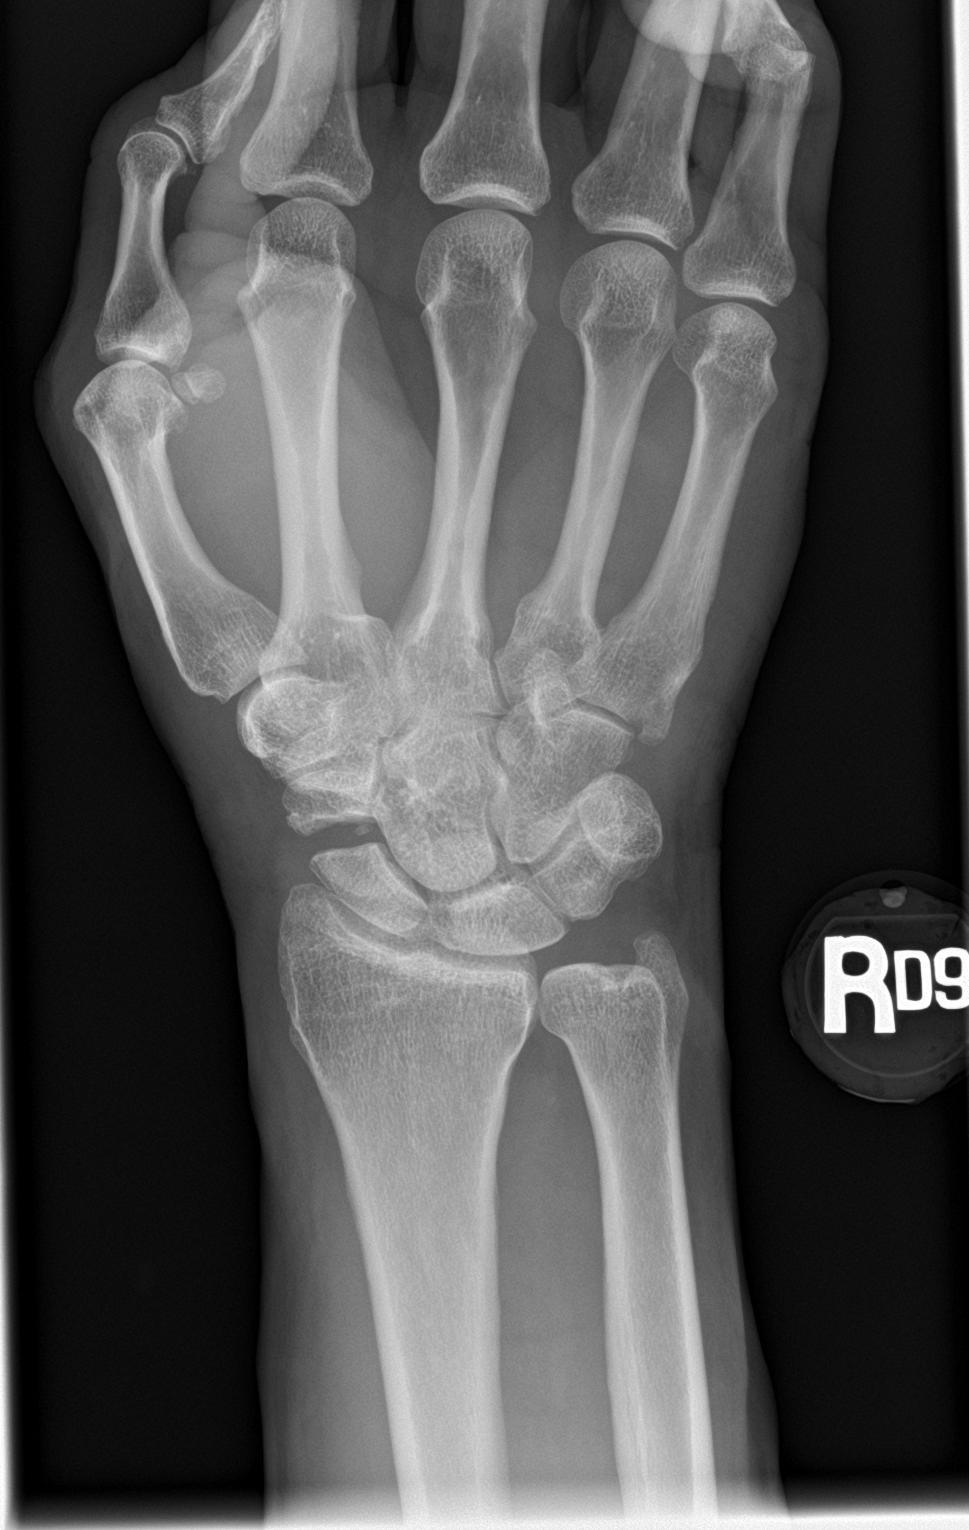

[wrist obl]
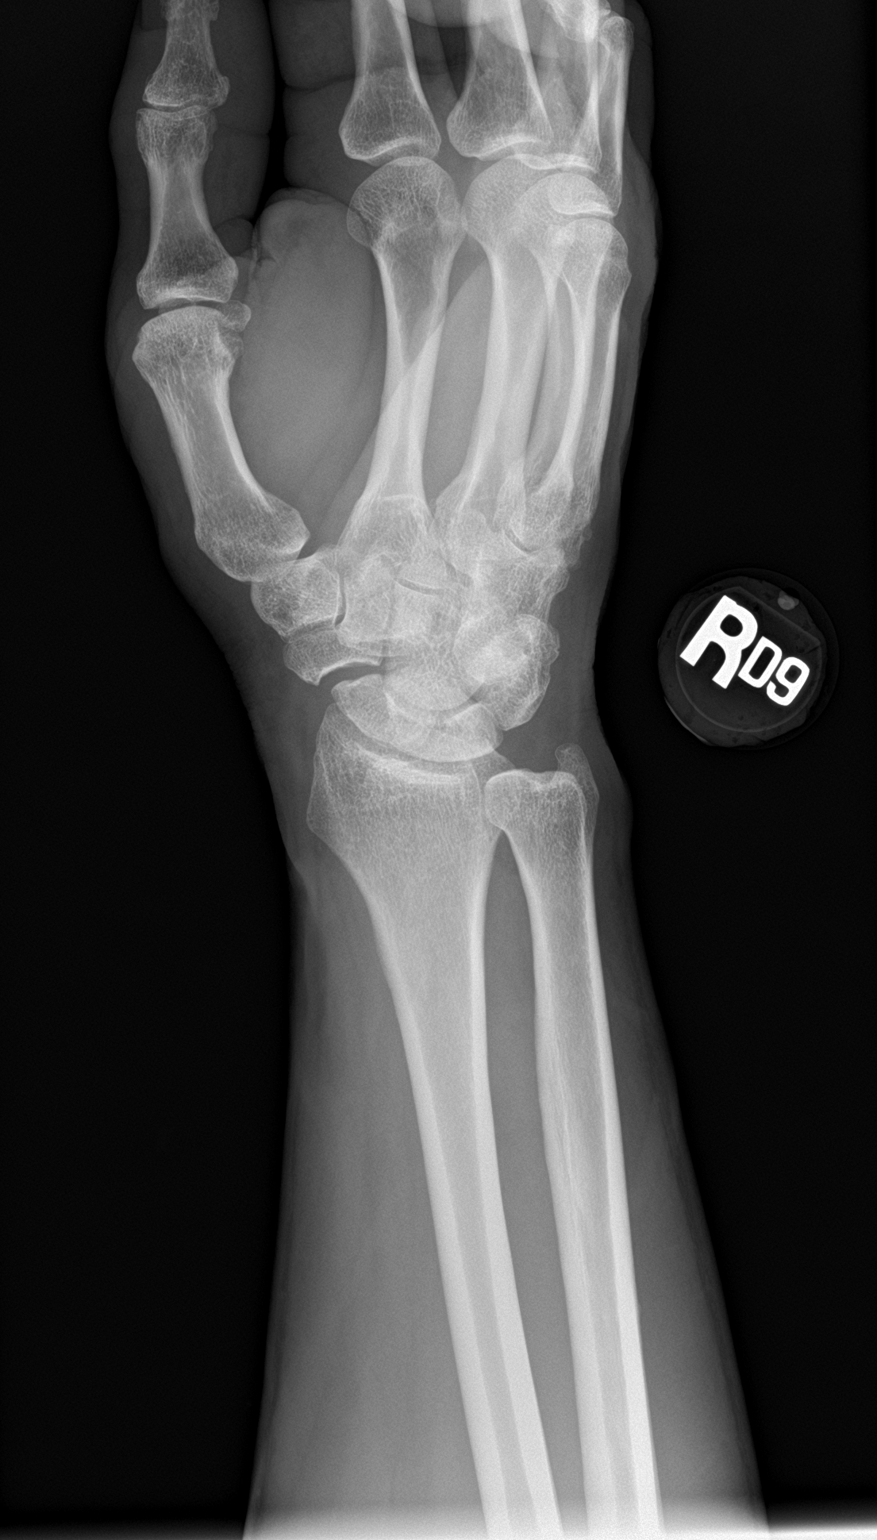

[wrist lat]
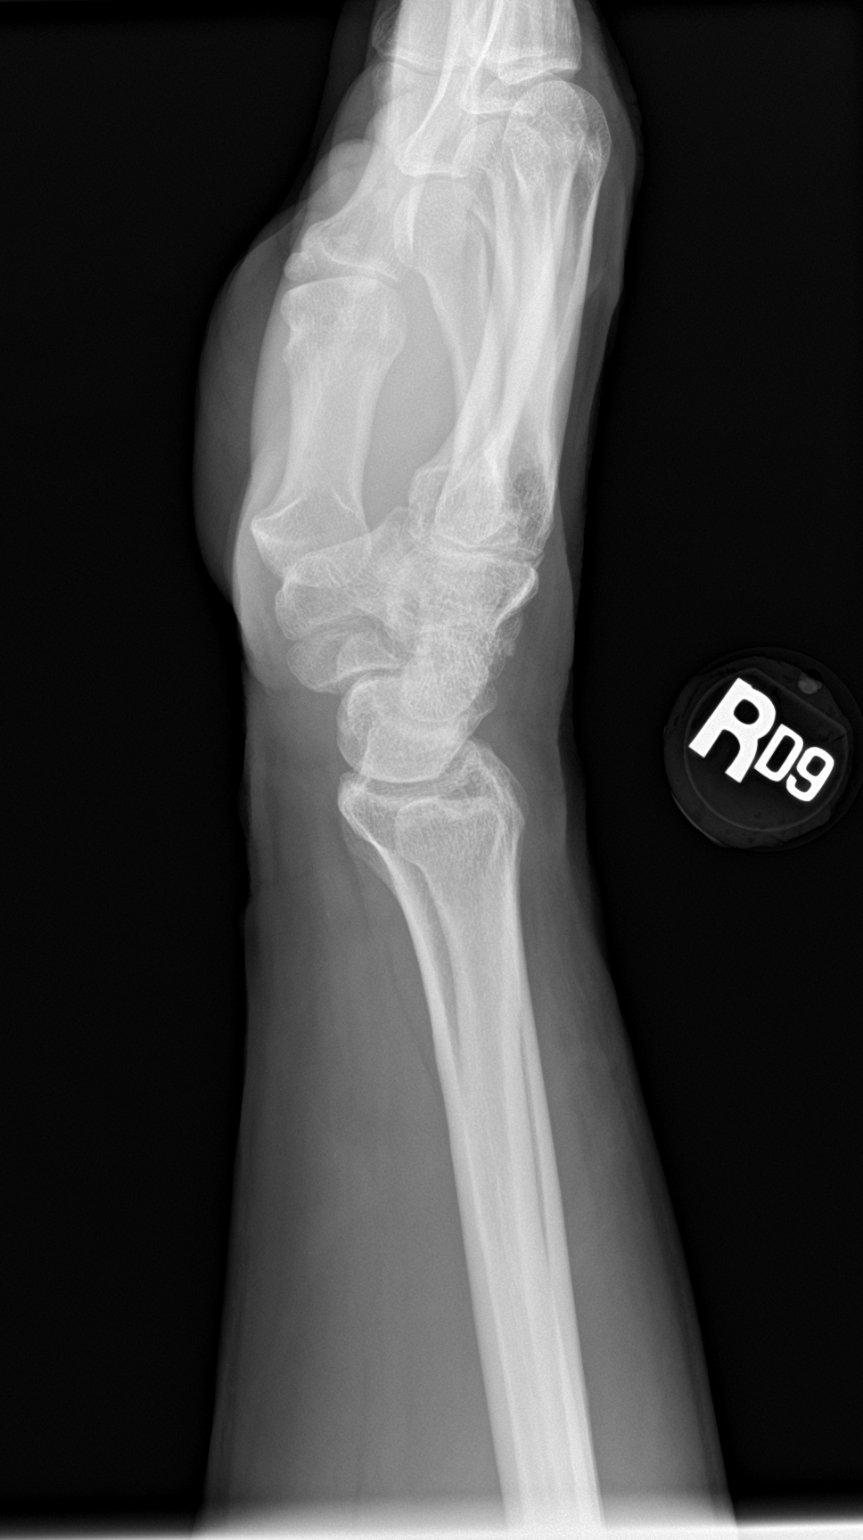

[wrist navicular]
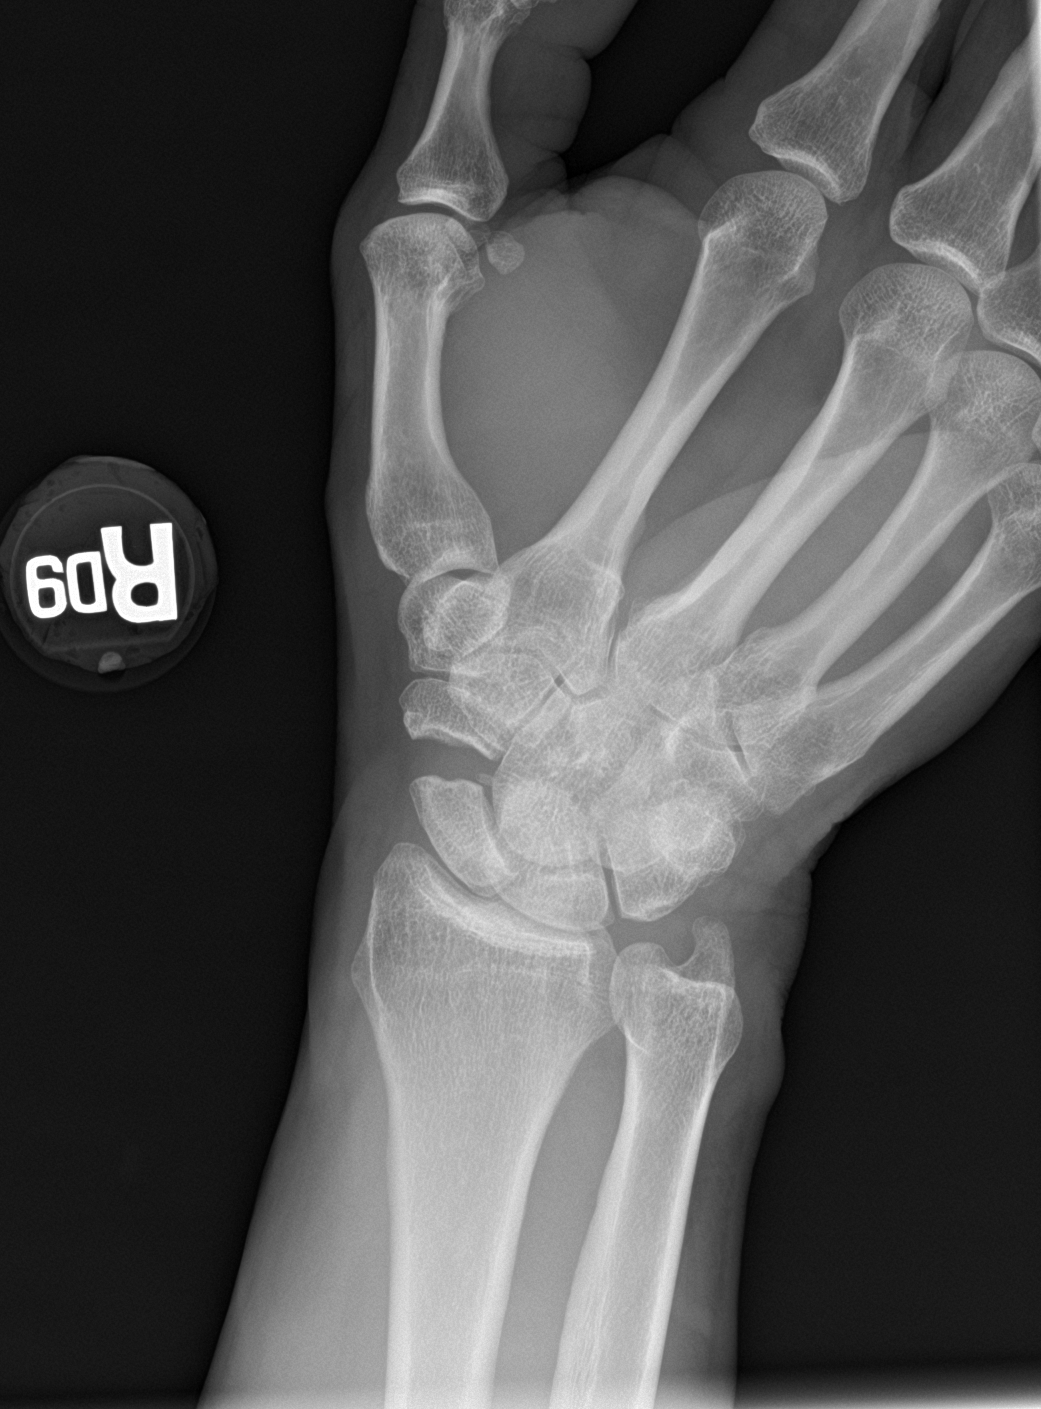

[4 of 4 positions shown; findings below may reference images not displayed]

FINDINGS: Remote scaphoid fracture with chronic nonunion and 3 mm osseous
distraction. No evidence of acute fracture. Otherwise normal
alignment. No erosions or periosteal reaction. Mild generalized soft
tissue edema.
IMPRESSION: 1. Remote scaphoid fracture with chronic nonunion and 3 mm osseous
distraction.
2. Mild generalized soft tissue edema.

## 2019-07-30 MED ORDER — PREDNISONE 10 MG PO TABS: ORAL_TABLET | ORAL | 0 refills | Status: DC

## 2019-07-30 MED ORDER — FLUTICASONE PROPIONATE 50 MCG/ACT NA SUSP: 2.0000 | Freq: Every day | NASAL | 6 refills | Status: DC

## 2019-07-30 MED FILL — FLUTICASONE PROP 50 MCG SPR: 50 | 30 days supply | Qty: 16 | Fill #0

## 2019-07-30 MED FILL — predniSONE 10 MG TABS: 10 | 7 days supply | Qty: 21 | Fill #0

## 2019-07-30 NOTE — Progress Notes (Signed)
Ronald Banks, is a 44 y.o. male  MGN:[id number]  BVQ:[id number]  DOB - 03-26-1975  Subjective:  Chief Complaint and HPI: Ronald Banks is a 44 y.o. male here today for recheck of R preauricular LN.  Finished antibiotics.  Pain is better but can't hear out of R ear.  Still feels congested.  Lymph node is almost resolved.  He did not start the flonase.   Acute R wrist pain w/o injury.  He did break that wrist ~30 yrs ago but no acute or recent injury.  No fever.  He has tolerated prednisone in the past with minimal changes in blood sugars  Also has thickening of tendons and "pulling" of his 4th flexor tendons on B hands.  This has been going on for years.    Has insulin pump for diabetes and last A1C=7.1 last month.  Managed by endocrine   ROS:   Constitutional:  No f/c, No night sweats, No unexplained weight loss. EENT:  No vision changes, No blurry vision, No hearing changes. No mouth, throat, or ear problems.  Respiratory: +ongoing cough(neg CXR 03/2019), No SOB Cardiac: No CP, no palpitations GI:  No abd pain, No N/V/D. GU: No Urinary s/sx Musculoskeletal: hand and wrist Neuro: No headache, no dizziness, no motor weakness.  Skin: No rash Endocrine:  No polydipsia. No polyuria.  Psych: Denies SI/HI  No problems updated.  ALLERGIES: No Known Allergies  PAST MEDICAL HISTORY: Past Medical History:  Diagnosis Date   Diabetes mellitus without complication ([place])    type 1    Hyperlipidemia    Hypertensive retinopathy of both eyes 05/19/2019   [facility] Exam done 05/13/2019   Moderate nonproliferative diabetic retinopathy of both eyes ([place]) 05/19/2019   Thyroid disease     MEDICATIONS AT HOME: Prior to Admission medications   Medication Sig Start Date End Date Taking? Authorizing Provider  ACCU-CHEK FASTCLIX LANCETS MISC Use to check blood sugar up to 5 times daily. 03/05/18   Fulp, Cammie, MD  ACCU-CHEK GUIDE test strip USE TO TEST BLOOD SUGAR UP TO 5 TIMES DAILY  02/17/19   Fulp, Cammie, MD  baclofen (LIORESAL) 10 MG tablet Take 1 tablet (10 mg total) by mouth 3 (three) times daily as needed for muscle spasms. 02/19/18   Hilts, Legrand Como, MD  Blood Glucose Monitoring Suppl (ACCU-CHEK GUIDE) w/Device KIT 1 kit by Does not apply route 5 (five) times daily. 03/05/18   Fulp, Cammie, MD  fluticasone (FLONASE) 50 MCG/ACT nasal spray Place 2 sprays into both nostrils daily. 07/30/19   Argentina Donovan, PA-C  gabapentin (NEURONTIN) 300 MG capsule Take 1 capsule (300 mg total) by mouth 3 (three) times daily. 05/19/19   Tanda Rockers, MD  HUMULIN 70/30 (70-30) 100 UNIT/ML injection 47 UNITS IN AM& 40 UNITS BEFORE EVENING MEAL.LOWER NIGHT DOSE TO 35 UNITS IF BG 70 OR LESS OVERNIGHT 03/26/19   Fulp, Cammie, MD  ibuprofen (ADVIL) 600 MG tablet Take 1 tablet (600 mg total) by mouth every 6 (six) hours as needed. 06/26/19   Argentina Donovan, PA-C  Insulin Detemir (LEVEMIR FLEXTOUCH) 100 UNIT/ML Pen Inject into the skin. 04/18/18 04/18/19  [provider]  INSULIN SYRINGE .5CC/29G (B-D INSULIN SYRINGE) 29G X 1/2" 0.5 ML MISC USE TO INJECT INSULIN EVERY DAY. 05/28/19   Fulp, Cammie, MD  levothyroxine (SYNTHROID, LEVOTHROID) 175 MCG tablet Take 1 tablet (175 mcg total) by mouth daily before breakfast. 02/11/18   Fulp, Cammie, MD  methocarbamol (ROBAXIN) 500 MG tablet Take  2 tablets (1,000 mg total) by mouth 3 (three) times daily. As needed for muscle spasm 02/11/18   Fulp, Cammie, MD  NOVOLOG FLEXPEN 100 UNIT/ML FlexPen INJECT 15 UNITS SUBCUTANEOUSLY 3TIMES DAILY W/MEALS. ADD SLIDING SCALE IF SUGAR 100. MAX 75UNITS/DAY 05/18/18   [provider]  pantoprazole (PROTONIX) 40 MG tablet Take 30- 60 min before your first and last meals of the day 05/19/19   Tanda Rockers, MD  predniSONE (DELTASONE) 10 MG tablet 6,5,4,3,2,1 take each days dose in the morning with food. 07/30/19   Argentina Donovan, PA-C  rosuvastatin (CRESTOR) 40 MG tablet Take by mouth. 05/30/18   [provider]  sildenafil (VIAGRA) 100 MG tablet Take 0.5-1 tablets (50-100 mg total) by mouth daily as needed for erectile dysfunction. 02/20/19   Fulp, Cammie, MD  telmisartan (MICARDIS) 40 MG tablet Take 1 tablet (40 mg total) by mouth daily. 04/07/19   Tanda Rockers, MD  traMADol Veatrice Bourbon) 50 MG tablet 1 po q hs prn pain 10/09/18   Meredith Pel, MD     Objective:  EXAM:   Vitals:   07/30/19 1000  BP: 131/69  Pulse: 77  Resp: 16  Temp: 98.4 F (36.9 C)  SpO2: 97%  Weight: 188 lb 9.6 oz (85.5 kg)    General appearance : A&OX3. NAD. Non-toxic-appearing HEENT: Atraumatic and Normocephalic.  PERRLA. EOM intact.  TM full on R, WNL on L.  RPAN has resolved by 90% now.  Almost non-palpable. Mouth-MMM, post pharynx WNL w/o erythema, No PND. Neck: supple, no JVD. No cervical lymphadenopathy. No thyromegaly Chest/Lungs:  Breathing-non-labored, Good air entry bilaterally, breath sounds normal without rales, rhonchi, or wheezing  CVS: S1 S2 regular, no murmurs, gallops, rubs  R wrist-decreased ROM and TTP on volar surface.  No eyrthema.  B Duputyren's contracture with current good mobility of fingers Extremities: Bilateral Lower Ext shows no edema, both legs are warm to touch with = pulse throughout Neurology:  CN II-XII grossly intact, Non focal.   Psych:  TP linear. J/I WNL. Normal speech. Appropriate eye contact and affect.  Skin:  No Rash  Data Review Lab Results  Component Value Date   HGBA1C 8.6 (H) 09/11/2018   HGBA1C 9.6 (H) 02/11/2018     Assessment & Plan   1. Uncontrolled type 1 diabetes mellitus with hyperglycemia ([place]) Keep f/up with endocrine-has insulin pump.  Prednisone/glucose precautions reviewed - Glucose (CBG)  2. Preauricular adenopathy Essentially resolved   3. Eustachian tube dysfunction, right - fluticasone (FLONASE) 50 MCG/ACT nasal spray; Place 2 sprays into both nostrils daily.  Dispense: 16 g; Refill: 6 Take sudafed or phenylephrine -  Ambulatory referral to ENT  4. Right wrist pain Gout vs CTS vs arthritis.  Wrist splint 24/7X1 week then at night - DG Wrist Complete Right; Future - predniSONE (DELTASONE) 10 MG tablet; 6,5,4,3,2,1 take each days dose in the morning with food.  Dispense: 21 tablet; Refill: 0 - Ambulatory referral to Hand Surgery - Uric Acid  5. Dupuytren's contracture of both hands - DG Wrist Complete Right; Future - Ambulatory referral to Hand Surgery  6. Hearing loss of right ear, unspecified hearing loss type - Ambulatory referral to ENT     Patient have been counseled extensively about nutrition and exercise  Return for september appt.  The patient was given clear instructions to go to ER or return to medical center if symptoms don't improve, worsen or new problems develop. The patient verbalized understanding. The patient was told  to call to get lab results if they haven't heard anything in the next week.     Freeman Caldron, PA-C [facility] [place]   07/30/2019, 10:25 AMPatient ID: Wilkin Lippy, male   DOB: 1976-01-13, 44 y.o.   MRN: [id number]

## 2019-07-30 NOTE — Patient Instructions (Signed)
Obtain wrist splint and wear 24/7 X 1 week then at night.  Drink 80-100 ounces water daily.  Get sudafed or phenylephrine decongestant   Eustachian Tube Dysfunction  Eustachian tube dysfunction refers to a condition in which a blockage develops in the narrow passage that connects the middle ear to the back of the nose (eustachian tube). The eustachian tube regulates air pressure in the middle ear by letting air move between the ear and nose. It also helps to drain fluid from the middle ear space. Eustachian tube dysfunction can affect one or both ears. When the eustachian tube does not function properly, air pressure, fluid, or both can build up in the middle ear. What are the causes? This condition occurs when the eustachian tube becomes blocked or cannot open normally. Common causes of this condition include:  Ear infections.  Colds and other infections that affect the nose, mouth, and throat (upper respiratory tract).  Allergies.  Irritation from cigarette smoke.  Irritation from stomach acid coming up into the esophagus (gastroesophageal reflux). The esophagus is the tube that carries food from the mouth to the stomach.  Sudden changes in air pressure, such as from descending in an airplane or scuba diving.  Abnormal growths in the nose or throat, such as: ? Growths that line the nose (nasal polyps). ? Abnormal growth of cells (tumors). ? Enlarged tissue at the back of the throat (adenoids). What increases the risk? You are more likely to develop this condition if:  You smoke.  You are overweight.  You are a child who has: ? Certain birth defects of the mouth, such as cleft palate. ? Large tonsils or adenoids. What are the signs or symptoms? Common symptoms of this condition include:  A feeling of fullness in the ear.  Ear pain.  Clicking or popping noises in the ear.  Ringing in the ear.  Hearing loss.  Loss of balance.  Dizziness. Symptoms may get worse  when the air pressure around you changes, such as when you travel to an area of high elevation, fly on an airplane, or go scuba diving. How is this diagnosed? This condition may be diagnosed based on:  Your symptoms.  A physical exam of your ears, nose, and throat.  Tests, such as those that measure: ? The movement of your eardrum (tympanogram). ? Your hearing (audiometry). How is this treated? Treatment depends on the cause and severity of your condition.  In mild cases, you may relieve your symptoms by moving air into your ears. This is called "popping the ears."  In more severe cases, or if you have symptoms of fluid in your ears, treatment may include: ? Medicines to relieve congestion (decongestants). ? Medicines that treat allergies (antihistamines). ? Nasal sprays or ear drops that contain medicines that reduce swelling (steroids). ? A procedure to drain the fluid in your eardrum (myringotomy). In this procedure, a small tube is placed in the eardrum to:  Drain the fluid.  Restore the air in the middle ear space. ? A procedure to insert a balloon device through the nose to inflate the opening of the eustachian tube (balloon dilation). Follow these instructions at home: Lifestyle  Do not do any of the following until your health care provider approves: ? Travel to high altitudes. ? Fly in airplanes. ? Work in a Pension scheme manager or room. ? Scuba dive.  Do not use any products that contain nicotine or tobacco, such as cigarettes and e-cigarettes. If you need help quitting, ask  your health care provider.  Keep your ears dry. Wear fitted earplugs during showering and bathing. Dry your ears completely after. General instructions  Take over-the-counter and prescription medicines only as told by your health care provider.  Use techniques to help pop your ears as recommended by your health care provider. These may include: ? Chewing gum. ? Yawning. ? Frequent, forceful  swallowing. ? Closing your mouth, holding your nose closed, and gently blowing as if you are trying to blow air out of your nose.  Keep all follow-up visits as told by your health care provider. This is important. Contact a health care provider if:  Your symptoms do not go away after treatment.  Your symptoms come back after treatment.  You are unable to pop your ears.  You have: ? A fever. ? Pain in your ear. ? Pain in your head or neck. ? Fluid draining from your ear.  Your hearing suddenly changes.  You become very dizzy.  You lose your balance. Summary  Eustachian tube dysfunction refers to a condition in which a blockage develops in the eustachian tube.  It can be caused by ear infections, allergies, inhaled irritants, or abnormal growths in the nose or throat.  Symptoms include ear pain, hearing loss, or ringing in the ears.  Mild cases are treated with maneuvers to unblock the ears, such as yawning or ear popping.  Severe cases are treated with medicines. Surgery may also be done (rare). This information is not intended to replace advice given to you by your health care provider. Make sure you discuss any questions you have with your health care provider. Document Revised: 05/01/2017 Document Reviewed: 05/01/2017 Elsevier Patient Education  [place].

## 2019-07-31 ENCOUNTER — Other Ambulatory Visit: Payer: Self-pay | Admitting: Physician Assistant

## 2019-07-31 DIAGNOSIS — S62009A Unspecified fracture of navicular [scaphoid] bone of unspecified wrist, initial encounter for closed fracture: Secondary | ICD-10-CM

## 2019-07-31 LAB — URIC ACID: Uric Acid: 4.3 mg/dL (ref 3.8–8.4)

## 2019-08-06 ENCOUNTER — Ambulatory Visit (INDEPENDENT_AMBULATORY_CARE_PROVIDER_SITE_OTHER): Payer: Medicaid Other | Admitting: Orthopedic Surgery

## 2019-08-06 ENCOUNTER — Other Ambulatory Visit: Payer: Self-pay

## 2019-08-06 DIAGNOSIS — M19031 Primary osteoarthritis, right wrist: Secondary | ICD-10-CM | POA: Diagnosis not present

## 2019-08-06 DIAGNOSIS — M720 Palmar fascial fibromatosis [Dupuytren]: Secondary | ICD-10-CM | POA: Diagnosis not present

## 2019-08-09 ENCOUNTER — Encounter: Payer: Self-pay | Admitting: Orthopedic Surgery

## 2019-08-09 NOTE — Progress Notes (Signed)
Office Visit Note   Patient: Ronald Banks           Date of Birth: [birth year]           MRN: [id number] Visit Date: 08/06/2019 Requested by: Ronald Donovan, PA-C [place],  [place] [address] PCP: [name], MD  Subjective: Chief Complaint  Patient presents with  . Right Wrist - Pain    HPI: Ronald Banks is a 44 year old right-hand-dominant patient with right wrist pain and bilateral palmar contractures.  The pain will occasionally wake him from sleep at night.  Reports some swelling.  He describes prior injury to his right wrist 20 years ago.  He stated he was treated in a cast but took the cast off himself and never followed up with the treating provider.  Does not take any medication for pain except Tylenol which gives him fairly limited relief.  He does do Architect work.              ROS: All systems reviewed are negative as they relate to the chief complaint within the history of present illness.  Patient denies  fevers or chills.   Assessment & Plan: Visit Diagnoses:  1. Arthritis of right wrist     Plan: Impression is scaphoid nonunion right wrist with fairly well-maintained radiocarpal alignment.  His scapholunate angle is increased consistent with early slac wrist.  Plan is observation for now.  Could consider episodic injections as needed.  He may eventually need a limited 4 corner fusion but for now he is managing reasonably well.  I would favor anti-inflammatories if his pain becomes severe.  The Dupuytren's contractures in both hands also or not yet to the level where they require intervention but it would be worth rechecking him in 6 months on that front.  Follow-Up Instructions: Return if symptoms worsen or fail to improve.   Orders:  No orders of the defined types were placed in this encounter.  No orders of the defined types were placed in this encounter.     Procedures: No procedures performed   Clinical Data: No additional  findings.  Objective: Vital Signs: There were no vitals taken for this visit.  Physical Exam:   Constitutional: Patient appears well-developed HEENT:  Head: Normocephalic Eyes:EOM are normal Neck: Normal range of motion Cardiovascular: Normal rate Pulmonary/chest: Effort normal Neurologic: Patient is alert Skin: Skin is warm Psychiatric: Patient has normal mood and affect    Ortho Exam: Ortho exam demonstrates about 10 degrees last wrist flexion and extension on the right wrist compared to the left.  Minimal swelling.  Does have Dupuytren's contracture bilaterally giving him only about 10 degree flexion contracture at the MCP joint.  Grip strength is intact.  Radial pulses intact.  Radial ulnar deviation unaffected right versus left wrist.  Specialty Comments:  No specialty comments available.  Imaging: No results found.   PMFS History: Patient Active Problem List   Diagnosis Date Noted  . Moderate nonproliferative diabetic retinopathy of both eyes ([place]) 05/19/2019  . Hypertensive retinopathy of both eyes 05/19/2019  . DOE (dyspnea on exertion) 05/19/2019  . Upper airway cough syndrome 04/07/2019  . Essential hypertension 04/07/2019  . Cigarette smoker 04/07/2019  . Diabetic peripheral neuropathy associated with type 1 diabetes mellitus ([place]) 02/11/2018  . Hyperlipidemia LDL goal <70 02/11/2018  . Hypothyroidism 02/11/2018  . Chronic right shoulder pain 02/11/2018  . Chronic midline low back pain with right-sided sciatica 02/11/2018  . Anxiety 02/11/2018   Past  Medical History:  Diagnosis Date  . Diabetes mellitus without complication ([place])    type 1   . Hyperlipidemia   . Hypertensive retinopathy of both eyes 05/19/2019   [facility] Exam done 05/13/2019  . Moderate nonproliferative diabetic retinopathy of both eyes ([place]) 05/19/2019  . Thyroid disease     Family History  Problem Relation Age of Onset  . Diabetes Mother   . Cancer Mother        Brain,  breast   . Diabetes Sister   . Diabetes Maternal Grandmother     Past Surgical History:  Procedure Laterality Date  . NO PAST SURGERIES     Social History   Occupational History  . Not on file  Tobacco Use  . Smoking status: Current Every Day Smoker    Packs/day: 1.00    Years: 30.00    Pack years: 30.00    Types: Cigarettes  . Smokeless tobacco: Never Used  Vaping Use  . Vaping Use: Never used  Substance and Sexual Activity  . Alcohol use: Not Currently  . Drug use: Yes    Types: Marijuana    Comment: Several times per week  . Sexual activity: Yes

## 2019-08-12 LAB — HM DIABETES EYE EXAM

## 2019-08-28 ENCOUNTER — Other Ambulatory Visit: Payer: Self-pay | Admitting: Otolaryngology

## 2019-08-28 ENCOUNTER — Other Ambulatory Visit: Payer: Self-pay | Admitting: Physician Assistant

## 2019-08-28 ENCOUNTER — Other Ambulatory Visit (HOSPITAL_COMMUNITY): Payer: Self-pay | Admitting: Otolaryngology

## 2019-08-28 DIAGNOSIS — H6690 Otitis media, unspecified, unspecified ear: Secondary | ICD-10-CM

## 2019-08-28 DIAGNOSIS — R221 Localized swelling, mass and lump, neck: Secondary | ICD-10-CM

## 2019-08-28 MED ORDER — IBUPROFEN 600 MG PO TABS: 600.0000 mg | ORAL_TABLET | Freq: Four times a day (QID) | ORAL | 0 refills | Status: DC | PRN

## 2019-09-04 ENCOUNTER — Other Ambulatory Visit: Payer: Self-pay

## 2019-09-04 ENCOUNTER — Ambulatory Visit
Admission: RE | Admit: 2019-09-04 | Discharge: 2019-09-04 | Disposition: A | Discharge status: Home / Self Care | Payer: Medicaid Other | Source: Ambulatory Visit | Attending: Otolaryngology | Admitting: Otolaryngology

## 2019-09-04 DIAGNOSIS — R221 Localized swelling, mass and lump, neck: Secondary | ICD-10-CM | POA: Diagnosis present

## 2019-09-04 LAB — POCT I-STAT CREATININE: Creatinine, Ser: 1 mg/dL (ref 0.61–1.24)

## 2019-09-04 IMAGING — CT CT NECK W/ CM
3 of 4 series · 11 of 33 positions shown, 13 images · IV contrast (omnipaque)
Comparison: None.

CLINICAL DATA: 43-year-old male with right side ear pain and
hearing loss for 4 months. Palpable abnormality. Smoker.

EXAM:
CT NECK WITH CONTRAST
TECHNIQUE: Multidetector CT imaging of the neck was performed using the
standard protocol following the bolus administration of intravenous
contrast.
CONTRAST:  75mL OMNIPAQUE IOHEXOL 300 MG/ML  SOLN

[Series 4: coronal neck neck (person_name) 2.00 cor · coronal · 0.59mm/px · 3 of 111 slices shown]
[im 35/111  bone]
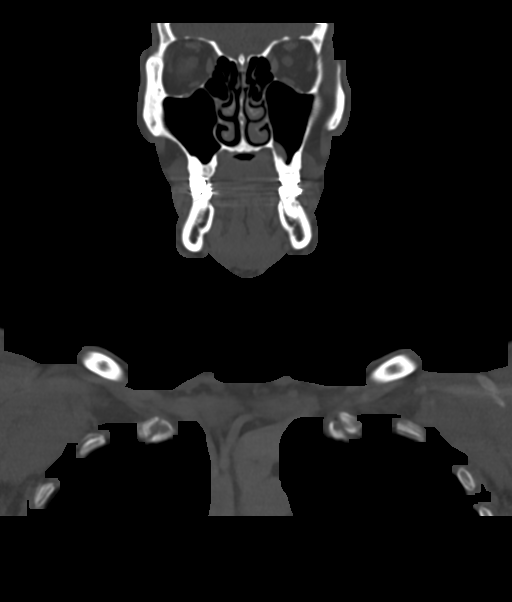
[im 49/111  bone]
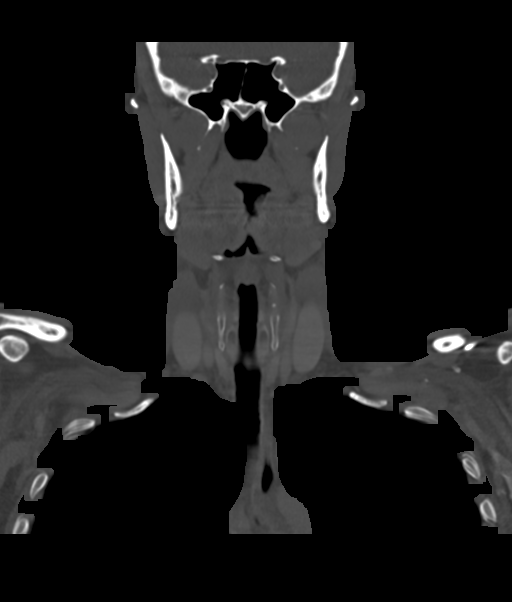
[im 62/111  bone]
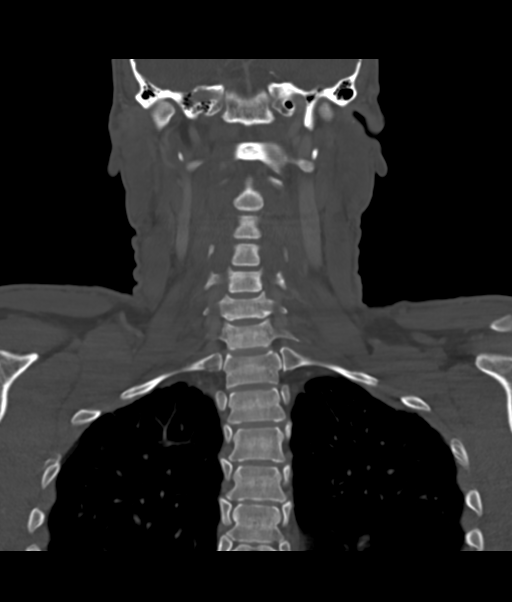

[Series 6: sagittal neck neck (person_name) 2.00 sag · sagittal · 0.43mm/px · 5 of 151 slices shown, 6 images]
[im 51/151  bone]
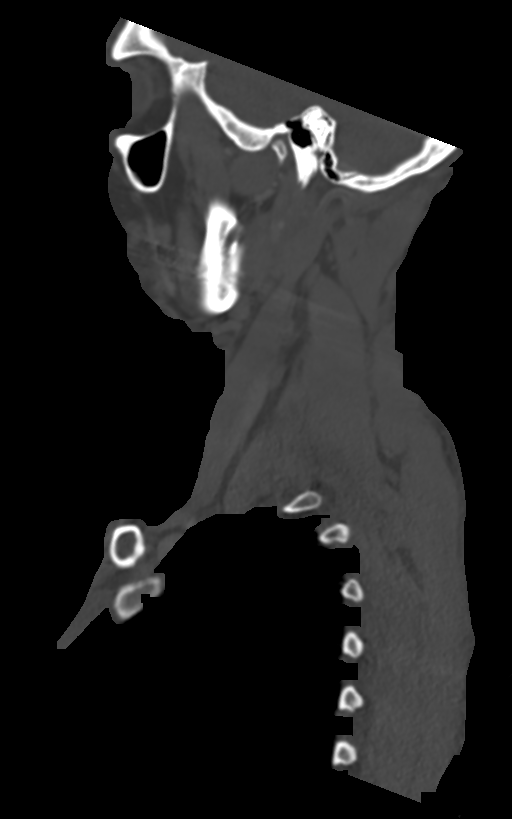
[im 63/151  bone]
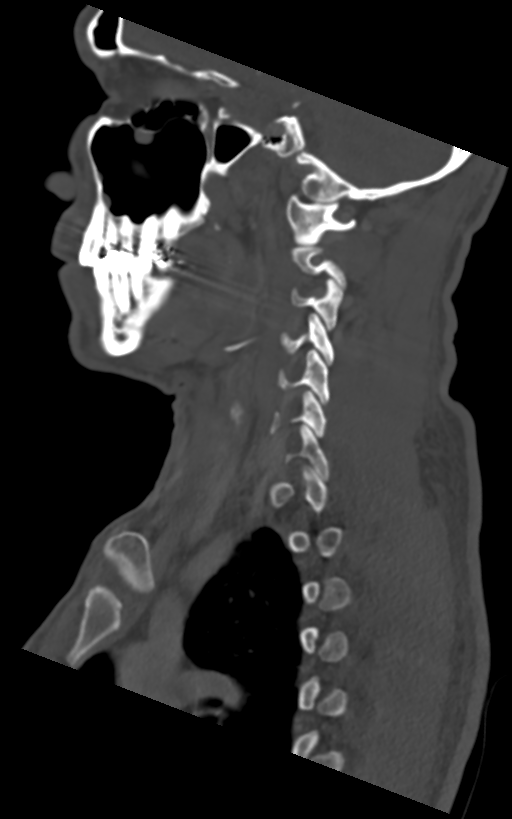
[im 76/151  soft-tissue]
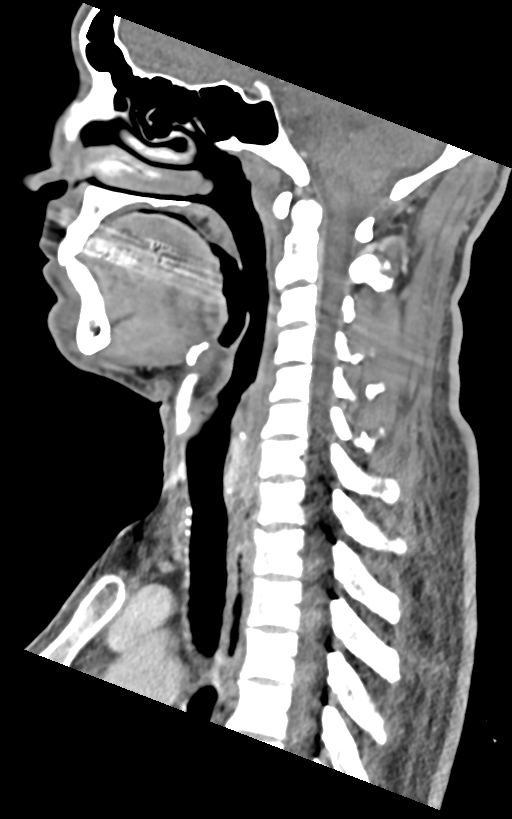
[im 76/151  bone]
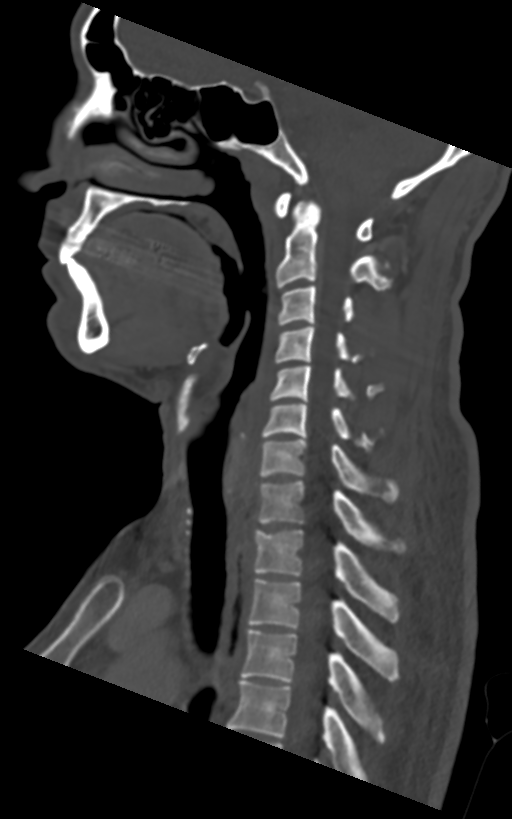
[im 88/151  bone]
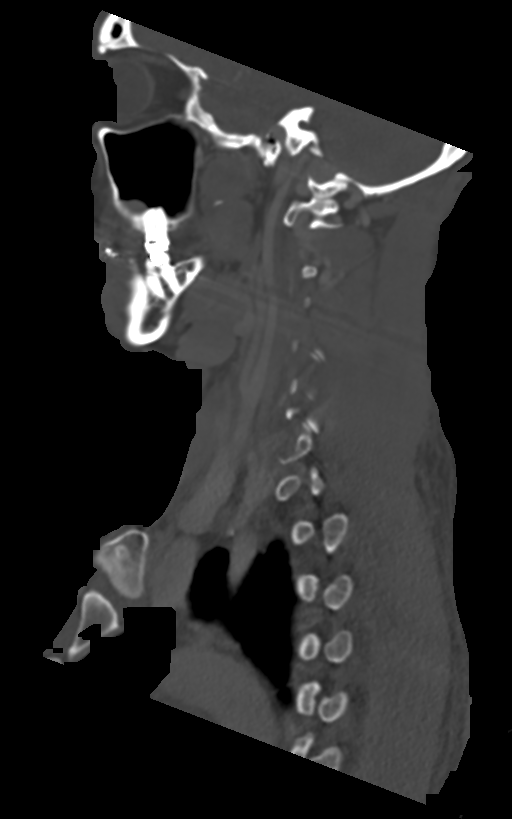
[im 101/151  bone]
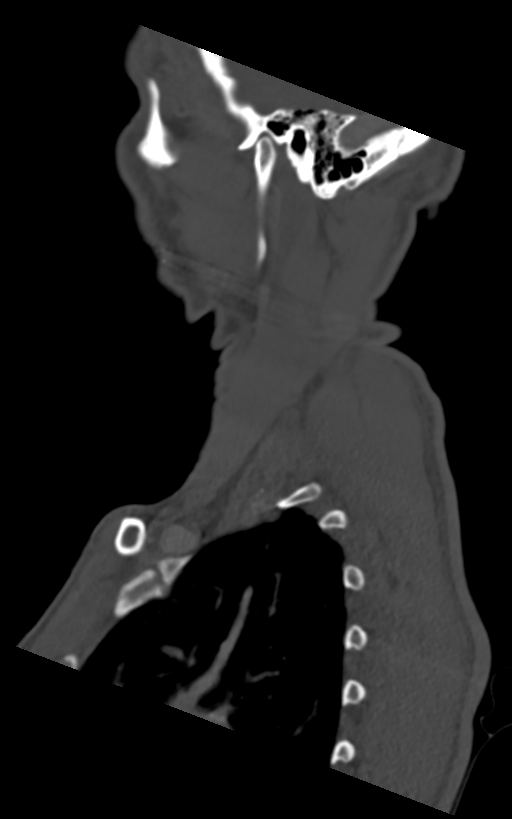

[Series 8: ax oropharynx neck neck (person_name) 2.00 ax · axial · 0.43mm/px · z∈[-740,-553]mm · 3 of 177 slices shown, 4 images]
[im 51/177  soft-tissue]
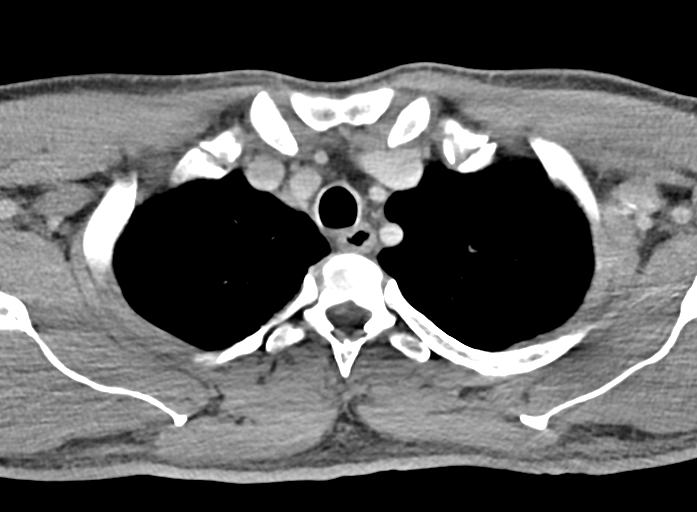
[im 51/177  bone]
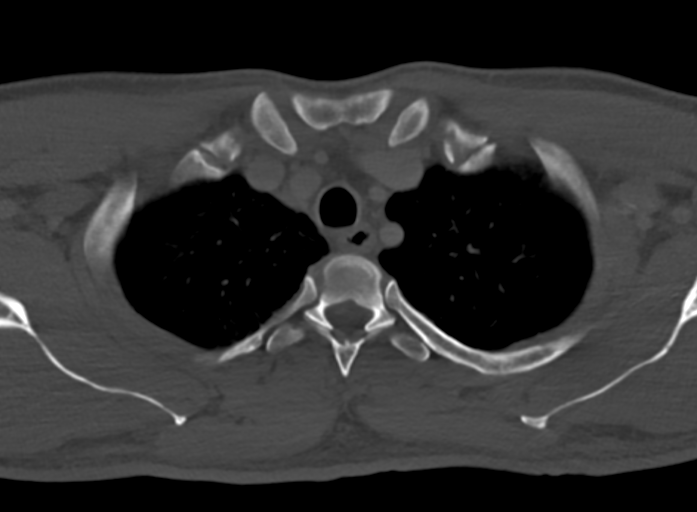
[im 101/177  bone]
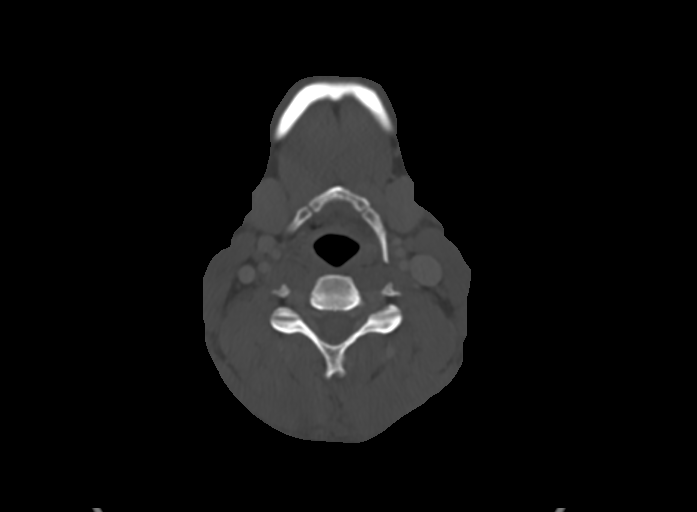
[im 151/177  bone]
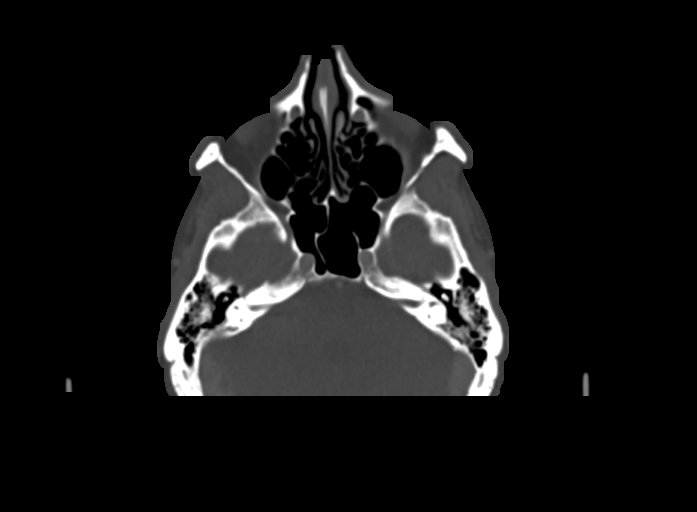

[11 of 33 positions shown; findings below may reference images not displayed]

FINDINGS: Pharynx and larynx: Larynx and pharynx soft tissue contours are
within normal limits. Mild postinflammatory calcifications of the
palatine tonsils and adenoids. Negative parapharyngeal and
retropharyngeal spaces.

Salivary glands: Negative sublingual space. Submandibular glands and
parotid glands appear symmetric and within normal limits.

Thyroid: Diminutive, negative.

Lymph nodes: Palpable area of concern marked along the left
suboccipital area on series 2, image 15. No underlying mass or
lymphadenopathy. Symmetric appearance of the soft tissues there.

Negative for cervical lymphadenopathy. Diminutive lymph nodes in the
neck.

Vascular: Major vascular structures in the neck and at the skull
base are patent and appear normal.

Limited intracranial: Negative.

Visualized orbits: Negative.

Mastoids and visualized paranasal sinuses: Bilateral tympanic
cavities and mastoids are clear. Most paranasal sinuses are clear,
there are scattered small bilateral maxillary sinus mucous retention
cysts.

Unremarkable bilateral periauricular soft tissues.

Skeleton: No TMJ degeneration. No acute osseous abnormality
identified. Mild for age cervical spine degeneration.

Upper chest: Paraseptal emphysema in the lung apices. Normal visible
superior mediastinum.
IMPRESSION: 1. Normal CT appearance of the Neck. No mass or abnormality at the
palpable area of concern. No explanation for ear pain or hearing
loss.

2. Emphysema ([P5]-[P5]).

## 2019-09-04 MED ORDER — IOHEXOL 300 MG/ML  SOLN
75.0000 mL | Freq: Once | INTRAMUSCULAR | Status: AC | PRN
  Administered 2019-09-04: 75 mL via INTRAVENOUS

## 2019-09-26 ENCOUNTER — Ambulatory Visit: Payer: Medicaid Other | Admitting: Family Medicine

## 2019-10-09 ENCOUNTER — Other Ambulatory Visit: Payer: Self-pay

## 2019-10-09 ENCOUNTER — Encounter: Payer: Self-pay | Admitting: Internal Medicine

## 2019-10-09 ENCOUNTER — Ambulatory Visit: Payer: Medicaid Other | Attending: Family Medicine | Admitting: Internal Medicine

## 2019-10-09 VITALS — BP 125/75 | HR 58 | Temp 97.8°F | Resp 16 | Wt 182.2 lb

## 2019-10-09 DIAGNOSIS — E1169 Type 2 diabetes mellitus with other specified complication: Secondary | ICD-10-CM

## 2019-10-09 DIAGNOSIS — E1065 Type 1 diabetes mellitus with hyperglycemia: Secondary | ICD-10-CM

## 2019-10-09 DIAGNOSIS — R05 Cough: Secondary | ICD-10-CM | POA: Diagnosis not present

## 2019-10-09 DIAGNOSIS — E1042 Type 1 diabetes mellitus with diabetic polyneuropathy: Secondary | ICD-10-CM | POA: Diagnosis not present

## 2019-10-09 DIAGNOSIS — F339 Major depressive disorder, recurrent, unspecified: Secondary | ICD-10-CM | POA: Diagnosis not present

## 2019-10-09 DIAGNOSIS — F329 Major depressive disorder, single episode, unspecified: Secondary | ICD-10-CM

## 2019-10-09 DIAGNOSIS — Z1159 Encounter for screening for other viral diseases: Secondary | ICD-10-CM

## 2019-10-09 DIAGNOSIS — R053 Chronic cough: Secondary | ICD-10-CM

## 2019-10-09 DIAGNOSIS — F17200 Nicotine dependence, unspecified, uncomplicated: Secondary | ICD-10-CM

## 2019-10-09 DIAGNOSIS — I10 Essential (primary) hypertension: Secondary | ICD-10-CM

## 2019-10-09 DIAGNOSIS — R413 Other amnesia: Secondary | ICD-10-CM

## 2019-10-09 DIAGNOSIS — E785 Hyperlipidemia, unspecified: Secondary | ICD-10-CM

## 2019-10-09 DIAGNOSIS — Z114 Encounter for screening for human immunodeficiency virus [HIV]: Secondary | ICD-10-CM

## 2019-10-09 HISTORY — DX: Major depressive disorder, single episode, unspecified: F32.9

## 2019-10-09 HISTORY — DX: Nicotine dependence, unspecified, uncomplicated: F17.200

## 2019-10-09 LAB — POCT GLYCOSYLATED HEMOGLOBIN (HGB A1C): HbA1c, POC (controlled diabetic range): 6.7 % (ref 0.0–7.0)

## 2019-10-09 LAB — GLUCOSE, POCT (MANUAL RESULT ENTRY): POC Glucose: 79 mg/dl (ref 70–99)

## 2019-10-09 MED ORDER — NICOTINE 21 MG/24HR TD PT24: 21.0000 mg | MEDICATED_PATCH | Freq: Every day | TRANSDERMAL | 1 refills | Status: DC

## 2019-10-09 MED ORDER — BUPROPION HCL ER (SR) 150 MG PO TB12: ORAL_TABLET | ORAL | 1 refills | Status: DC

## 2019-10-09 NOTE — Progress Notes (Signed)
Patient ID: Ronald Banks, male    DOB: Apr 28, 1975  MRN: [id number]  CC: Diabetes and Hypertension   Subjective: Ronald Banks is a 44 y.o. male who presents for chronic ds management.  PCP is Dr. [name].  He is wanting to change PCP to me as his mother is also my patient. His concerns today include:  Patient with history of diabetes type 1 with moderate NPDR and neuropathy, HL, HTN, hypothyroid, UACS (Dr. Melvyn Novas), tob dep, memory issues  DIABETES TYPE 2 Last A1C:   Results for orders placed or performed in visit on 10/09/19  POCT glucose (manual entry)  Result Value Ref Range   POC Glucose 79 70 - 99 mg/dl  POCT glycosylated hemoglobin (Hb A1C)  Result Value Ref Range   Hemoglobin A1C     HbA1c POC (<> result, manual entry)     HbA1c, POC (prediabetic range)     HbA1c, POC (controlled diabetic range) 6.7 0.0 - 7.0 %   Followed by endocrinologist, Dr. Raechel Chute in [place].  Last seen about 3 mths ago.  He is on an insulin pump. Med Adherence:  [x]  Yes    []  No Medication side effects:  []  Yes    []  No Home Monitoring?  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  He has not eaten as yet for the morning. Numbness of the feet? [x]  Yes    []  No Retinopathy hx? []  Yes    []  No Last eye exam: Up-to-date with eye exam. Comments:   Depression:  States that this has been a major issue for him.  He does not identify any contributing factor.  Was seeing psychiatrist about 3 yrs ago and was on med but he does not recall the name.  I see he had discussed the same with Dr. [name] in December of last year and was referred to psychiatry at that time.  He tells me he does not like talking to people.  He denies any suicidal ideation.  When depression he does not want to work or interact with people.  He feels his decreased  appetite may also be due to depression.   -live with GF who is supportive.  He has a 61 year old child. -He also complains of issues with poor memory.  States that he had reported this in the past  HTN: Reports compliance with Micardis and low-salt diet.  No chest pains or shortness of breath.  HL: Taking and tolerating Crestor.    Tob dep:  Smokes 2 pks/day.  He has smoked for years.  Never tried to quit in the past but wants to quit.  He feels that he would need help in quitting.  Afraid of the nicotine patches because his brother developed a rash when he tried the patches.  Has issues with chronic cough especially in the mornings.  He has been seeing pulmonologist Dr. Melvyn Novas.  He is on gabapentin for upper airway cough syndrome which he does not feel help Patient Active Problem List   Diagnosis Date Noted  . Moderate nonproliferative diabetic retinopathy of both eyes ([place]) 05/19/2019  . Hypertensive retinopathy of both eyes 05/19/2019  . DOE (dyspnea on exertion) 05/19/2019  . Upper airway cough syndrome 04/07/2019  . Essential hypertension 04/07/2019  . Cigarette smoker 04/07/2019  .  DM type 1 with diabetic peripheral neuropathy ([place]) 02/11/2018  . Hyperlipidemia LDL goal <70 02/11/2018  . Hypothyroidism 02/11/2018  . Chronic right shoulder pain 02/11/2018  . Chronic midline low back pain with right-sided sciatica 02/11/2018  . Anxiety 02/11/2018     Current Outpatient Medications on File Prior to Visit  Medication Sig Dispense Refill  . ACCU-CHEK FASTCLIX LANCETS MISC Use to check blood sugar up to 5 times daily. 102 each 11  . ACCU-CHEK GUIDE test strip USE TO TEST BLOOD SUGAR UP TO 5 TIMES DAILY 100 strip 11  . baclofen (LIORESAL) 10 MG tablet Take 1 tablet (10 mg total) by mouth 3 (three) times daily as needed for muscle spasms. 90 tablet 1  . Blood Glucose Monitoring Suppl (ACCU-CHEK GUIDE) w/Device KIT 1 kit by Does not apply route 5 (five) times daily. 1 kit 0  . fluticasone  (FLONASE) 50 MCG/ACT nasal spray Place 2 sprays into both nostrils daily. 16 g 6  . gabapentin (NEURONTIN) 300 MG capsule Take 1 capsule (300 mg total) by mouth 3 (three) times daily. 90 capsule 3  . HUMULIN 70/30 (70-30) 100 UNIT/ML injection 47 UNITS IN AM& 40 UNITS BEFORE EVENING MEAL.LOWER NIGHT DOSE TO 35 UNITS IF BG 70 OR LESS OVERNIGHT 30 mL 1  . ibuprofen (ADVIL) 600 MG tablet Take 1 tablet (600 mg total) by mouth every 6 (six) hours as needed. 40 tablet 0  . Insulin Detemir (LEVEMIR FLEXTOUCH) 100 UNIT/ML Pen Inject into the skin.    . INSULIN SYRINGE .5CC/29G (B-D INSULIN SYRINGE) 29G X 1/2" 0.5 ML MISC USE TO INJECT INSULIN EVERY DAY. 100 each 2  . levothyroxine (SYNTHROID, LEVOTHROID) 175 MCG tablet Take 1 tablet (175 mcg total) by mouth daily before breakfast. 30 tablet 5  . methocarbamol (ROBAXIN) 500 MG tablet Take 2 tablets (1,000 mg total) by mouth 3 (three) times daily. As needed for muscle spasm 90 tablet 0  . NOVOLOG FLEXPEN 100 UNIT/ML FlexPen INJECT 15 UNITS SUBCUTANEOUSLY 3TIMES DAILY W/MEALS. ADD SLIDING SCALE IF SUGAR 100. MAX 75UNITS/DAY    . pantoprazole (PROTONIX) 40 MG tablet Take 30- 60 min before your first and last meals of the day 60 tablet 2  . rosuvastatin (CRESTOR) 40 MG tablet Take by mouth.    . sildenafil (VIAGRA) 100 MG tablet Take 0.5-1 tablets (50-100 mg total) by mouth daily as needed for erectile dysfunction. 5 tablet 5  . telmisartan (MICARDIS) 40 MG tablet Take 1 tablet (40 mg total) by mouth daily. 30 tablet 11   No current facility-administered medications on file prior to visit.    No Known Allergies  Social History   Socioeconomic History  . Marital status: Single    Spouse name: Not on file  . Number of children: Not on file  . Years of education: Not on file  . Highest education level: Not on file  Occupational History  . Not on file  Tobacco Use  . Smoking status: Current Every Day Smoker    Packs/day: 1.00    Years: 30.00    Pack  years: 30.00    Types: Cigarettes  . Smokeless tobacco: Never Used  Vaping Use  . Vaping Use: Never used  Substance and Sexual Activity  . Alcohol use: Not Currently  . Drug use: Yes    Types: Marijuana    Comment: Several times per week  . Sexual activity: Yes  Other Topics Concern  . Not on file  Social History Narrative   Right  handed    Caffeine 2 cups per day    Lives at home with his mom    Social Determinants of Health   Financial Resource Strain:   . Difficulty of Paying Living Expenses: Not on file  Food Insecurity:   . Worried About Charity fundraiser in the Last Year: Not on file  . Ran Out of Food in the Last Year: Not on file  Transportation Needs:   . Lack of Transportation (Medical): Not on file  . Lack of Transportation (Non-Medical): Not on file  Physical Activity:   . Days of Exercise per Week: Not on file  . Minutes of Exercise per Session: Not on file  Stress:   . Feeling of Stress : Not on file  Social Connections:   . Frequency of Communication with Friends and Family: Not on file  . Frequency of Social Gatherings with Friends and Family: Not on file  . Attends Religious Services: Not on file  . Active Member of Clubs or Organizations: Not on file  . Attends Archivist Meetings: Not on file  . Marital Status: Not on file  Intimate Partner Violence:   . Fear of Current or Ex-Partner: Not on file  . Emotionally Abused: Not on file  . Physically Abused: Not on file  . Sexually Abused: Not on file    Family History  Problem Relation Age of Onset  . Diabetes Mother   . Cancer Mother        Brain, breast   . Diabetes Sister   . Diabetes Maternal Grandmother     Past Surgical History:  Procedure Laterality Date  . NO PAST SURGERIES      ROS: Review of Systems Negative except as stated above  PHYSICAL EXAM: BP 125/75   Pulse (!) 58   Temp 97.8 F (36.6 C)   Resp 16   Wt 182 lb 3.2 oz (82.6 kg)   SpO2 96%   BMI 26.14  kg/m   Wt Readings from Last 3 Encounters:  10/09/19 182 lb 3.2 oz (82.6 kg)  07/30/19 188 lb 9.6 oz (85.5 kg)  07/03/19 193 lb 3.2 oz (87.6 kg)    Physical Exam General appearance - alert, well appearing, and in no distress Mental status - normal mood, behavior, speech, dress, motor activity, and thought processes. Eyes - pupils equal and reactive, extraocular eye movements intact Mouth - mucous membranes moist, pharynx normal without lesions Neck - supple, no significant adenopathy Chest - clear to auscultation, no wheezes, rales or rhonchi, symmetric air entry Heart - normal rate, regular rhythm, normal S1, S2, no murmurs, rubs, clicks or gallops Extremities - peripheral pulses normal, no pedal edema, no clubbing or cyanosis.  He has numerous abrasions on both lower legs from below the knee down. Diabetic Foot Exam - Simple   Simple Foot Form Visual Inspection No deformities, no ulcerations, no other skin breakdown bilaterally: Yes Sensation Testing See comments: Yes Pulse Check Posterior Tibialis and Dorsalis pulse intact bilaterally: Yes Comments Decreased sensation on the plantar surface of both feet     Depression screen [facility] 2/9 10/09/2019 07/30/2019 06/26/2019  Decreased Interest _0 Down, Depressed, Hopeless - 3 1  PHQ - 2 Score _1 Altered sleeping 3 2 0  Tired, decreased energy _2 Change in appetite _3 Feeling bad or failure about yourself  _4 Trouble concentrating 3 3 0  Moving slowly  or fidgety/restless 0 1 0  Suicidal thoughts 0 1 0  PHQ-9 Score _0 Difficult doing work/chores - - -    CMP Latest Ref Rng & Units 09/04/2019 05/19/2019 10/31/2018  Glucose 70 - 99 mg/dL - 114(H) -  BUN 6 - 23 mg/dL - 7 -  Creatinine 0.61 - 1.24 mg/dL 1.00 0.94 -  Sodium 135 - 145 mEq/L - 132(L) -  Potassium 3.5 - 5.1 mEq/L - 4.5 -  Chloride 96 - 112 mEq/L - 97 -  CO2 19 - 32 mEq/L - 31 -  Calcium 8.4 - 10.5 mg/dL - 9.2 -  Total Protein 6.0 - 8.5 g/dL - -  6.6  Total Bilirubin 0.0 - 1.2 mg/dL - - -  Alkaline Phos 39 - 117 IU/L - - -  AST 0 - 40 IU/L - - -  ALT 0 - 44 IU/L - - -   Lipid Panel     Component Value Date/Time   CHOL 226 (H) 09/11/2018 0954   TRIG 89 09/11/2018 0954   HDL 65 09/11/2018 0954   CHOLHDL 3.5 09/11/2018 0954   LDLCALC 143 (H) 09/11/2018 0954    CBC    Component Value Date/Time   WBC 9.0 05/19/2019 1528   RBC 4.89 05/19/2019 1528   HGB 15.8 05/19/2019 1528   HGB 16.1 09/11/2018 0954   HCT 46.0 05/19/2019 1528   HCT 46.8 09/11/2018 0954   PLT 313.0 05/19/2019 1528   PLT 284 09/11/2018 0954   MCV 94.0 05/19/2019 1528   MCV 95 09/11/2018 0954   MCH 32.5 09/11/2018 0954   MCH 31.6 04/01/2018 1327   MCHC 34.4 05/19/2019 1528   RDW 13.0 05/19/2019 1528   RDW 12.5 09/11/2018 0954   LYMPHSABS 2.7 05/19/2019 1528   LYMPHSABS 2.5 09/11/2018 0954   MONOABS 0.5 05/19/2019 1528   EOSABS 0.1 05/19/2019 1528   EOSABS 0.2 09/11/2018 0954   BASOSABS 0.1 05/19/2019 1528   BASOSABS 0.1 09/11/2018 0954    ASSESSMENT AND PLAN: 1. DM type 1 with diabetic peripheral neuropathy (HCC) A1c at goal.  Keep his follow-up appointment with the endocrinologist.  Blood sugar is a bit low today.  Given a drink here today and advised to drink it before getting behind the wheels. - POCT glucose (manual entry) - POCT glycosylated hemoglobin (Hb A1C) - Microalbumin / creatinine urine ratio  2. Tobacco dependence Advised to quit.  Discussed health risks associated with smoking.  Patient ready to quit but feels he would need help.  We discussed methods to help him quit.  I recommend the medication Wellbutrin which I told him we will kill 2 birds with 1 stone and that it will help decrease depression and also help decrease cravings for cigarettes.  Patient was agreeable to trying the medication.  Went over possible side effects. -We also discussed the nicotine patches which I told him would help even better in terms of decreasing the  cravings.  Patient states that he is willing to try as long as it does not cause a rash for him.  I told him there is no way for [place] to predict whether it would but if it does he should just discontinue using it.  I went over how to use the nicotine patch.  We will start him on the 21 mg and do a stepdown approach.  Less than 5 minutes spent on counseling. - buPROPion (WELLBUTRIN SR) 150 MG 12 hr tablet; 1 tab daily x 1  week then increase to 1 tab BID  Dispense: 60 tablet; Refill: 1 - nicotine (NICODERM CQ - DOSED IN MG/24 HOURS) 21 mg/24hr patch; Place 1 patch (21 mg total) onto the skin daily.  Dispense: 28 patch; Refill: 1  3. Chronic cough Recommend that he follow-up with Dr. Melvyn Novas if he feels the gabapentin is not helping.  He does not want to go back to that pulmonologist.  I offered for him to see Dr. Joya Gaskins here at our facility.  He states he will think about it.  Also encouraged smoking cessation.  4. Major depression, recurrent, chronic (HCC) We discussed management of depression which can include medication plus counseling or one of the other.  He is willing to try Wellbutrin for the benefits as listed above in #3.  He was also agreeable to seeing a counselor if we can find one in [place]. - buPROPion (WELLBUTRIN SR) 150 MG 12 hr tablet; 1 tab daily x 1 week then increase to 1 tab BID  Dispense: 60 tablet; Refill: 1 - Ambulatory referral to Psychiatry  5. Memory difficulty Wanted to have my CMA do the Mini-Mental status exam with him today but patient states that he did not have time.  We can do this on a subsequent visit.  6. Essential hypertension At goal.  Continue Micardis and low-salt diet.  7. Hyperlipidemia associated with type 2 diabetes mellitus ([place]) Continue Crestor.  8. Screening for HIV (human immunodeficiency virus) Patient agreeable to screening. - HIV Antibody (routine testing w rflx)  9. Need for hepatitis C screening test Patient agreeable to screening. -  Hepatitis C Antibody     Patient was given the opportunity to ask questions.  Patient verbalized understanding of the plan and was able to repeat key elements of the plan.   Orders Placed This Encounter  Procedures  . Microalbumin / creatinine urine ratio  . HIV Antibody (routine testing w rflx)  . Hepatitis C Antibody  . Ambulatory referral to Psychiatry  . POCT glucose (manual entry)  . POCT glycosylated hemoglobin (Hb A1C)     Requested Prescriptions   Signed Prescriptions Disp Refills  . buPROPion (WELLBUTRIN SR) 150 MG 12 hr tablet 60 tablet 1    Sig: 1 tab daily x 1 week then increase to 1 tab BID  . nicotine (NICODERM CQ - DOSED IN MG/24 HOURS) 21 mg/24hr patch 28 patch 1    Sig: Place 1 patch (21 mg total) onto the skin daily.    Return in about 6 weeks (around 11/20/2019).  Karle Plumber, MD, FACP

## 2019-10-09 NOTE — Patient Instructions (Addendum)
Wellbutrin /Bupropion tablets (Depression/Mood Disorders) What is this medicine? BUPROPION (byoo PROE pee on) is used to treat depression. This medicine may be used for other purposes; ask your health care provider or pharmacist if you have questions. COMMON BRAND NAME(S): Wellbutrin What should I tell my health care provider before I take this medicine? They need to know if you have any of these conditions:  an eating disorder, such as anorexia or bulimia  bipolar disorder or psychosis  diabetes or high blood sugar, treated with medication  glaucoma  heart disease, previous heart attack, or irregular heart beat  head injury or brain tumor  high blood pressure  kidney or liver disease  seizures  suicidal thoughts or a previous suicide attempt  Tourette's syndrome  weight loss  an unusual or allergic reaction to bupropion, other medicines, foods, dyes, or preservatives  breast-feeding  pregnant or trying to become pregnant How should I use this medicine? Take this medicine by mouth with a glass of water. Follow the directions on the prescription label. You can take it with or without food. If it upsets your stomach, take it with food. Take your medicine at regular intervals. Do not take your medicine more often than directed. Do not stop taking this medicine suddenly except upon the advice of your doctor. Stopping this medicine too quickly may cause serious side effects or your condition may worsen. A special MedGuide will be given to you by the pharmacist with each prescription and refill. Be sure to read this information carefully each time. Talk to your pediatrician regarding the use of this medicine in children. Special care may be needed. Overdosage: If you think you have taken too much of this medicine contact a poison control center or emergency room at once. NOTE: This medicine is only for you. Do not share this medicine with others. What if I miss a dose? If you  miss a dose, take it as soon as you can. If it is less than four hours to your next dose, take only that dose and skip the missed dose. Do not take double or extra doses. What may interact with this medicine? Do not take this medicine with any of the following medications:  linezolid  MAOIs like Azilect, Carbex, Eldepryl, Marplan, Nardil, and Parnate  methylene blue (injected into a vein)  other medicines that contain bupropion like Zyban This medicine may also interact with the following medications:  alcohol  certain medicines for anxiety or sleep  certain medicines for blood pressure like metoprolol, propranolol  certain medicines for depression or psychotic disturbances  certain medicines for HIV or AIDS like efavirenz, lopinavir, nelfinavir, ritonavir  certain medicines for irregular heart beat like propafenone, flecainide  certain medicines for Parkinson's disease like amantadine, levodopa  certain medicines for seizures like carbamazepine, phenytoin, phenobarbital  cimetidine  clopidogrel  cyclophosphamide  digoxin  furazolidone  isoniazid  nicotine  orphenadrine  procarbazine  steroid medicines like prednisone or cortisone  stimulant medicines for attention disorders, weight loss, or to stay awake  tamoxifen  theophylline  thiotepa  ticlopidine  tramadol  warfarin This list may not describe all possible interactions. Give your health care provider a list of all the medicines, herbs, non-prescription drugs, or dietary supplements you use. Also tell them if you smoke, drink alcohol, or use illegal drugs. Some items may interact with your medicine. What should I watch for while using this medicine? Tell your doctor if your symptoms do not get better or if they get  worse. Visit your doctor or healthcare provider for regular checks on your progress. Because it may take several weeks to see the full effects of this medicine, it is important to  continue your treatment as prescribed by your doctor. This medicine may cause serious skin reactions. They can happen weeks to months after starting the medicine. Contact your healthcare provider right away if you notice fevers or flu-like symptoms with a rash. The rash may be red or purple and then turn into blisters or peeling of the skin. Or, you might notice a red rash with swelling of the face, lips or lymph nodes in your neck or under your arms. Patients and their families should watch out for new or worsening thoughts of suicide or depression. Also watch out for sudden changes in feelings such as feeling anxious, agitated, panicky, irritable, hostile, aggressive, impulsive, severely restless, overly excited and hyperactive, or not being able to sleep. If this happens, especially at the beginning of treatment or after a change in dose, call your healthcare provider. Avoid alcoholic drinks while taking this medicine. Drinking excessive alcoholic beverages, using sleeping or anxiety medicines, or quickly stopping the use of these agents while taking this medicine may increase your risk for a seizure. Do not drive or use heavy machinery until you know how this medicine affects you. This medicine can impair your ability to perform these tasks. Do not take this medicine close to bedtime. It may prevent you from sleeping. Your mouth may get dry. Chewing sugarless gum or sucking hard candy, and drinking plenty of water may help. Contact your doctor if the problem does not go away or is severe. What side effects may I notice from receiving this medicine? Side effects that you should report to your doctor or health care professional as soon as possible:  allergic reactions like skin rash, itching or hives, swelling of the face, lips, or tongue  breathing problems  changes in vision  confusion  elevated mood, decreased need for sleep, racing thoughts, impulsive behavior  fast or irregular  heartbeat  hallucinations, loss of contact with reality  increased blood pressure  rash, fever, and swollen lymph nodes  redness, blistering, peeling, or loosening of the skin, including inside the mouth  seizures  suicidal thoughts or other mood changes  unusually weak or tired  vomiting Side effects that usually do not require medical attention (report to your doctor or health care professional if they continue or are bothersome):  constipation  headache  loss of appetite  nausea  tremors  weight loss This list may not describe all possible side effects. Call your doctor for medical advice about side effects. You may report side effects to FDA at 1-800-FDA-1088. Where should I keep my medicine? Keep out of the reach of children. Store at room temperature between 20 and 25 degrees C (68 and 77 degrees F), away from direct sunlight and moisture. Keep tightly closed. Throw away any unused medicine after the expiration date. NOTE: This sheet is a summary. It may not cover all possible information. If you have questions about this medicine, talk to your doctor, pharmacist, or health care provider.  2020 Elsevier/Gold Standard (2018-04-04 14:02:47)

## 2019-10-10 LAB — MICROALBUMIN / CREATININE URINE RATIO
Creatinine, Urine: 34.6 mg/dL
Microalb/Creat Ratio: <9 mg/g creat (ref 0–29)
Microalbumin, Urine: <3 ug/mL

## 2019-10-10 LAB — HIV ANTIBODY (ROUTINE TESTING W REFLEX): HIV Screen 4th Generation wRfx: NONREACTIVE

## 2019-10-10 LAB — HEPATITIS C ANTIBODY: Hep C Virus Ab: <0.1 s/co ratio (ref 0.0–0.9)

## 2019-10-31 ENCOUNTER — Other Ambulatory Visit: Payer: Self-pay | Admitting: Internal Medicine

## 2019-10-31 DIAGNOSIS — F17200 Nicotine dependence, unspecified, uncomplicated: Secondary | ICD-10-CM

## 2019-10-31 DIAGNOSIS — F339 Major depressive disorder, recurrent, unspecified: Secondary | ICD-10-CM

## 2019-10-31 NOTE — Telephone Encounter (Signed)
Requested medication (s) are due for refill today: yes  Requested medication (s) are on the active medication list: yes  Last refill:  10/09/19 #60 1 refill  Future visit scheduled: yes  Notes to clinic: Sig needs updated. Pharmacy requesting Dx code and refill for 90 days.    Requested Prescriptions  Pending Prescriptions Disp Refills   buPROPion (WELLBUTRIN SR) 150 MG 12 hr tablet [Pharmacy Med Name: BUPROPION HCL SR 150 MG TABLET] 180 tablet 1    Sig: TAKE 1 TABLET DAILY FOR 1 WEEK, THEN 1 INCREASE TO 1 TABLET TWICE A DAY      Psychiatry: Antidepressants - bupropion Passed - 10/31/2019  9:32 AM      Passed - Completed PHQ-2 or PHQ-9 in the last 360 days.      Passed - Last BP in normal range    BP Readings from Last 1 Encounters:  10/09/19 125/75          Passed - Valid encounter within last 6 months    Recent Outpatient Visits           3 weeks ago DM type 1 with diabetic peripheral neuropathy [facility])   [place], [place]   3 months ago Uncontrolled type 1 diabetes mellitus with hyperglycemia [facility])   [place], [place], [place]   4 months ago Uncontrolled type 1 diabetes mellitus with hyperglycemia [facility])   [place], [place], [place]   8 months ago Recurrent productive cough   [place], [place]   9 months ago Recurrent productive cough   [place], [place]       Future Appointments             In 2 weeks Ladell Pier, MD [place]

## 2019-11-04 ENCOUNTER — Other Ambulatory Visit: Payer: Self-pay | Admitting: Internal Medicine

## 2019-11-04 DIAGNOSIS — I10 Essential (primary) hypertension: Secondary | ICD-10-CM

## 2019-11-04 DIAGNOSIS — E1042 Type 1 diabetes mellitus with diabetic polyneuropathy: Secondary | ICD-10-CM

## 2019-11-05 NOTE — Telephone Encounter (Signed)
Patient Instructions by Tanda Rockers, MD at 07/03/2019 9:15 AM Author: Tanda Rockers, MD Author Type: Physician Filed: 07/06/2019 5:59 AM  Note Status: Addendum Cosign: Cosign Not Required Encounter Date: 07/03/2019  Editor: Tanda Rockers, MD (Physician)      Prior Versions: 1. Tanda Rockers, MD (Physician) at 07/03/2019 9:49 AM - Signed      To get the most out of exercise, you need to be continuously aware that you are short of breath, but never out of breath, for 30 minutes daily. As you improve, it will actually be easier for you to do the same amount of exercise  in  30 minutes so always push to the level where you are short of breath.   The key is to stop smoking completely before smoking completely stops you!  I strongly recommend you get the covid 19 vaccine asap   Pulmonary follow up is as needed       Dr. Melvyn Novas, please advise if you are okay with [place] refilling meds or if they need to be deferred to pt's PCP

## 2019-11-14 ENCOUNTER — Telehealth: Payer: Self-pay | Admitting: Internal Medicine

## 2019-11-14 NOTE — Telephone Encounter (Signed)
Copied from [place] [phone] [phone]. Topic: Quick Communication - See Telephone Encounter >> Nov 13, 2019 10:29 AM Loma [place] wrote: CRM for notification. See Telephone encounter for: 11/13/19.Ronald Banks with [place] with Nurse care states that pt was under her care at one point but can be no longer due to [facility] but again stating she can no longer consult wanted to leave general message that pt expressed weight loss of 50 plus lbs in last year, coughing, lack of energy  even to the point of quitting his job. Very heavy smoker,  States has had no eval.  Wanting to make PCP aware of situation

## 2019-11-20 ENCOUNTER — Encounter: Payer: Self-pay | Admitting: Internal Medicine

## 2019-11-20 ENCOUNTER — Other Ambulatory Visit: Payer: Self-pay

## 2019-11-20 ENCOUNTER — Telehealth: Payer: Self-pay | Admitting: *Deleted

## 2019-11-20 ENCOUNTER — Ambulatory Visit: Payer: Medicaid Other | Attending: Internal Medicine | Admitting: Internal Medicine

## 2019-11-20 ENCOUNTER — Ambulatory Visit (HOSPITAL_COMMUNITY)
Admission: RE | Admit: 2019-11-20 | Discharge: 2019-11-20 | Disposition: A | Discharge status: Home / Self Care | Payer: Medicaid Other | Source: Ambulatory Visit | Attending: Internal Medicine | Admitting: Internal Medicine

## 2019-11-20 ENCOUNTER — Other Ambulatory Visit: Payer: Self-pay | Admitting: Internal Medicine

## 2019-11-20 VITALS — BP 121/74 | HR 69 | Resp 16 | Wt 187.4 lb

## 2019-11-20 DIAGNOSIS — E1042 Type 1 diabetes mellitus with diabetic polyneuropathy: Secondary | ICD-10-CM | POA: Diagnosis not present

## 2019-11-20 DIAGNOSIS — R634 Abnormal weight loss: Secondary | ICD-10-CM

## 2019-11-20 DIAGNOSIS — E039 Hypothyroidism, unspecified: Secondary | ICD-10-CM

## 2019-11-20 DIAGNOSIS — M545 Low back pain, unspecified: Secondary | ICD-10-CM

## 2019-11-20 DIAGNOSIS — R413 Other amnesia: Secondary | ICD-10-CM

## 2019-11-20 DIAGNOSIS — F339 Major depressive disorder, recurrent, unspecified: Secondary | ICD-10-CM | POA: Diagnosis not present

## 2019-11-20 DIAGNOSIS — F17200 Nicotine dependence, unspecified, uncomplicated: Secondary | ICD-10-CM

## 2019-11-20 LAB — GLUCOSE, POCT (MANUAL RESULT ENTRY): POC Glucose: 163 mg/dl — AB (ref 70–99)

## 2019-11-20 IMAGING — DX DG LUMBAR SPINE 2-3V
3 series · 3 of 3 positions shown · non-contrast
Comparison: MRI of the lumbar spine [DATE], lumbar spine plain
film [DATE]

CLINICAL DATA: painful boney protrusion RT lumbar region. LOWER
back pain. Pain for 3 days.

EXAM:
LUMBAR SPINE - 2-3 VIEW

[l-spine lat]
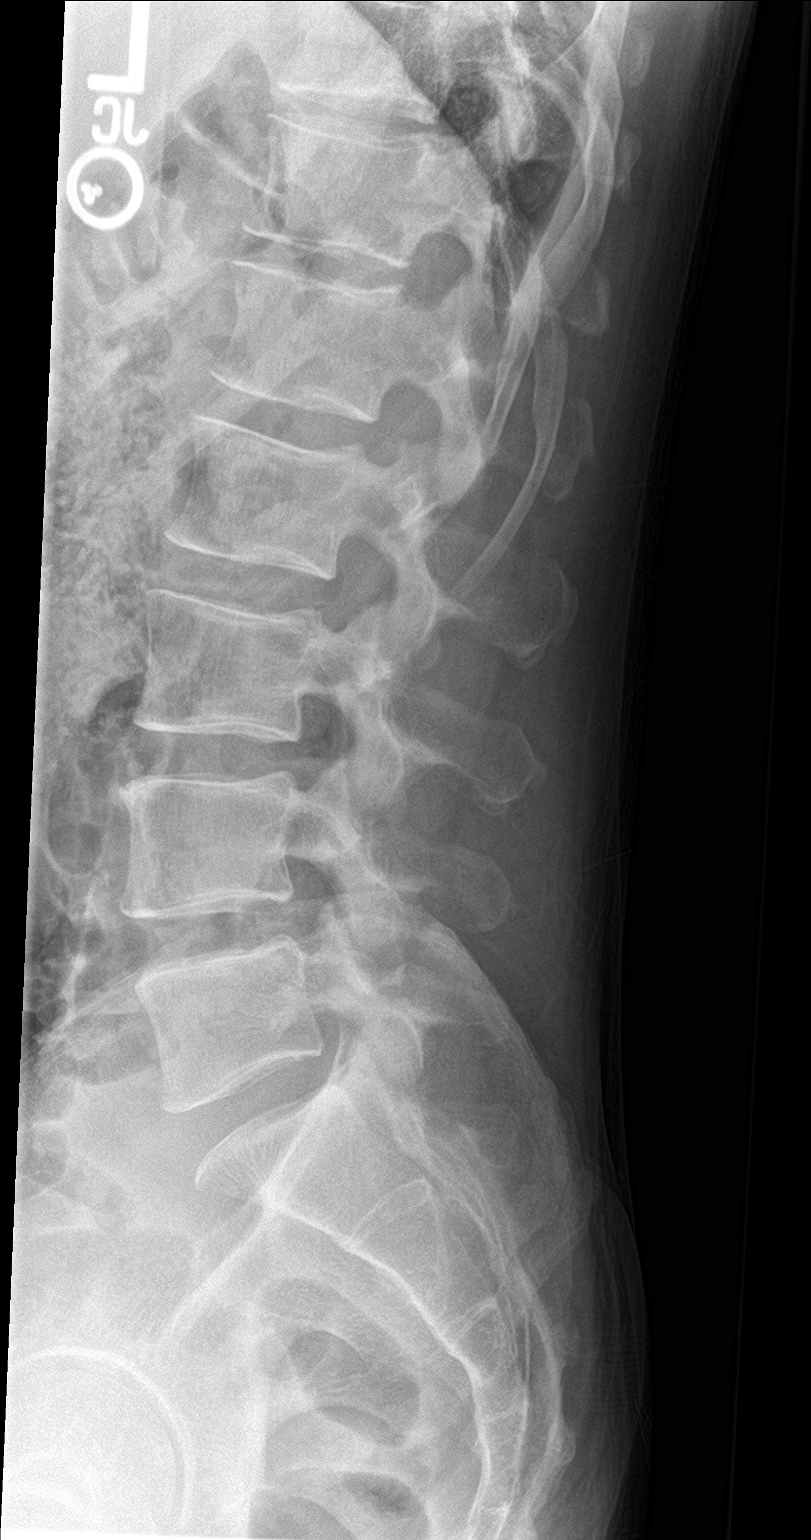

[l-spine spot]
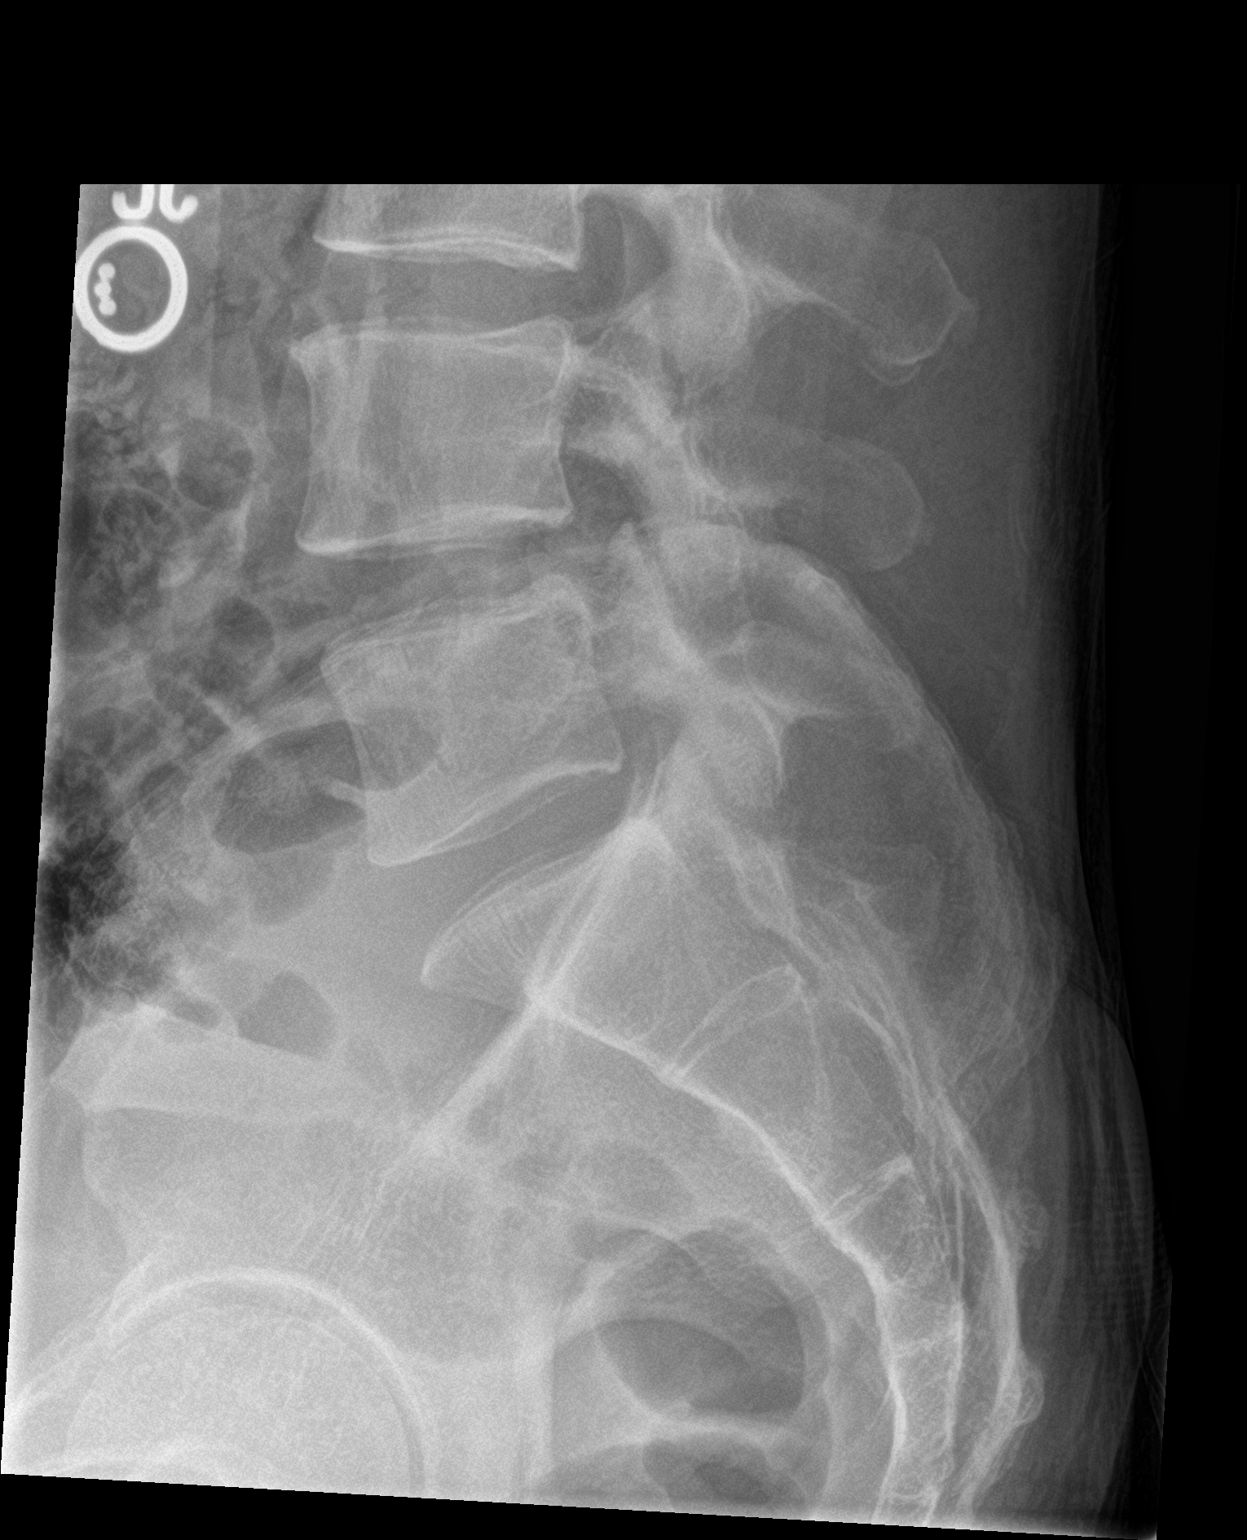

[l-spine ap]
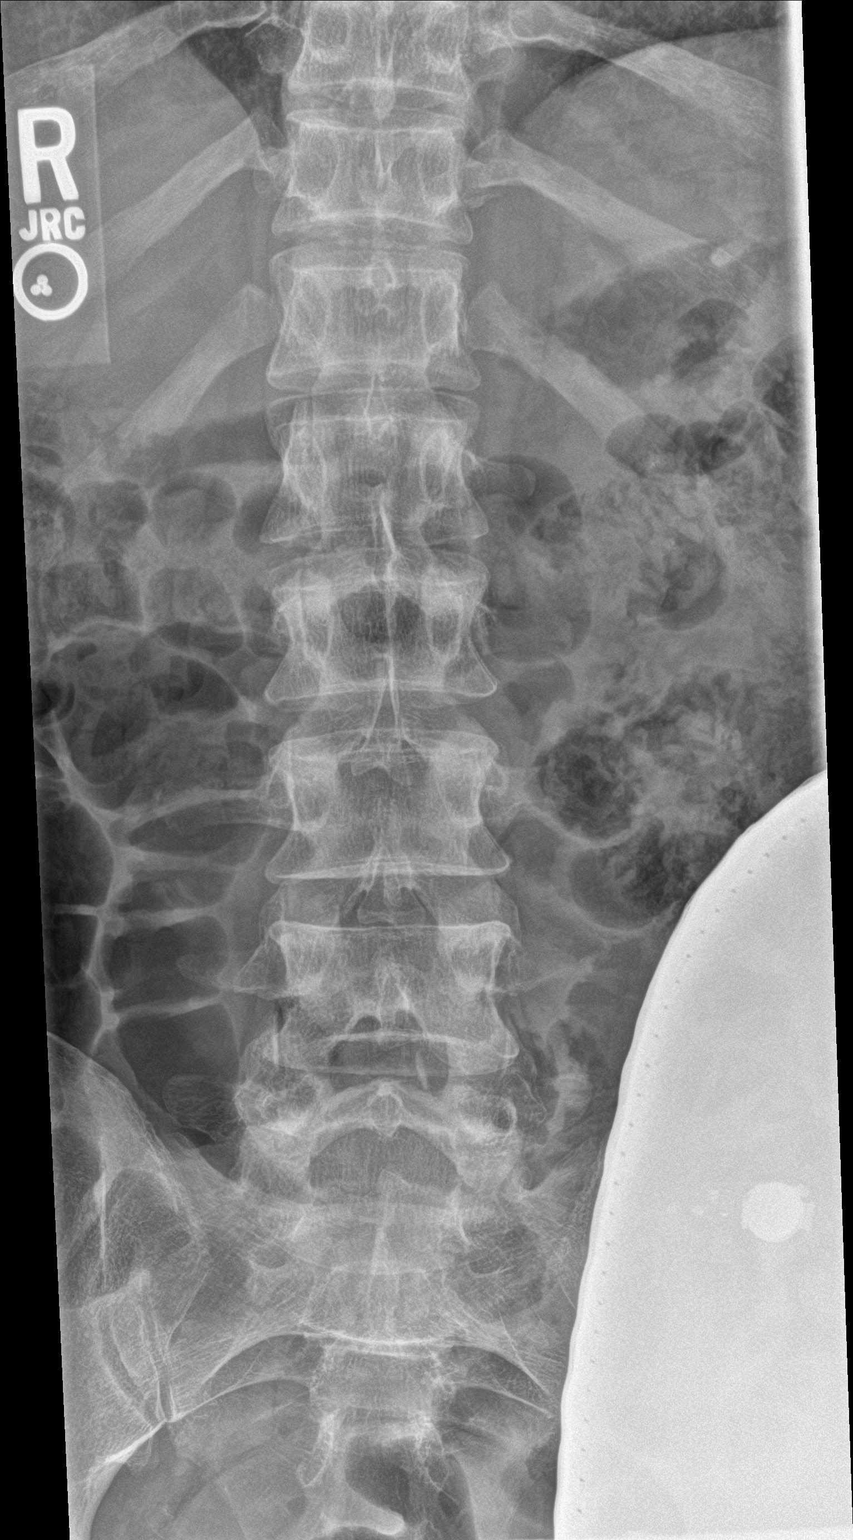

[3 of 3 positions shown; findings below may reference images not displayed]

FINDINGS: There is no evidence of lumbar spine fracture. Alignment is normal.
Intervertebral disc spaces are maintained.
IMPRESSION: Negative.

## 2019-11-20 MED ORDER — NICOTINE POLACRILEX 2 MG MT GUM: 2.0000 mg | CHEWING_GUM | OROMUCOSAL | 0 refills | Status: DC | PRN

## 2019-11-20 MED ORDER — TRAMADOL HCL 50 MG PO TABS: 50.0000 mg | ORAL_TABLET | Freq: Two times a day (BID) | ORAL | 0 refills | Status: DC | PRN

## 2019-11-20 MED ORDER — CYCLOBENZAPRINE HCL 5 MG PO TABS: 5.0000 mg | ORAL_TABLET | Freq: Two times a day (BID) | ORAL | 0 refills | Status: DC | PRN

## 2019-11-20 MED ORDER — FLUOXETINE HCL 10 MG PO CAPS: 10.0000 mg | ORAL_CAPSULE | Freq: Every day | ORAL | 1 refills | Status: DC

## 2019-11-20 NOTE — Telephone Encounter (Signed)
Spk to Sonia Side from [facility], XR tech, he stated patient had an insulin pump in place on abdomen and was unable to do the 5 view lumbar series that was ordered.  There's  protocol that XR's cannot be obtained when pumps are in place. Patient did not have another pump with him and wished not to remove the one that was in place.   A limited L-spine, 3 view  XR was obtain instead of the 5 view L-Spine.   Imaging had already been taken and patient had already when call was placed to [facility].

## 2019-11-20 NOTE — Telephone Encounter (Signed)
Copied from [place] [phone]. Topic: General - Other >> Nov 20, 2019 11:50 AM Leward Quan A wrote: Reason for CRM: Sonia Side from [facility] called to inform Dr Wynetta Emery that he had to make changes to patient orders updated in Epic with explanation. Was not able to do oblique any questions please call Ph# [phone] to speak with Sonia Side

## 2019-11-20 NOTE — Patient Instructions (Addendum)
Stop Wellbutrin. We have started you on a medication called Prozac instead. Below are the names of 3 mental health providers in the [place] area.  Please call your insurance and find out whether you you are in network for any of them and then let me know so that I can submit a referral. [facility] [phone] Ellender Hose [phone] [place] [phone]   Continue the nicotine patches.  We have prescribed the nicotine gum for you also.  If your blood tests are normal and your thyroid level is normal.  We will pursue imaging studies of the chest and abdomen as part of the work-up for unexplained weight loss.

## 2019-11-20 NOTE — Progress Notes (Signed)
Patient ID: Ronald Banks, male    DOB: [birth year]  MRN: [id number]  CC: Diabetes and Hypertension   Subjective: Ronald Banks is a 44 y.o. male who presents for 6 wks f/u His concerns today include:  Patient with history of diabetes type 1 (insulin pump) with moderate NPDR and neuropathy, HL, HTN, hypothyroid, UACS (Dr. Melvyn Novas - on Gabapentin), emphysema on CT 08/2019,tob dep, MDD, memory issues  Patient complains of pain and stiffness in the right lower back x3 days.  He feels it is coming from a bony protrusion in the right lower back which he had not noticed until 3 days ago.  He reports that the pain kept him up all night.  Better with walking, worse with sitting and laying down.  No radiation of pain down the legs.  No numbness or tingling in the legs.  No loss of bowel or bladder function. Not sure of initiating factors but started the day after he did a lot of heavy lifting of wood repetitively up to 150 pounds.   Taking Tylenol and Ibuprofen and using icy Hot; none of these have helped. .  Tob dep:  Prescribed patches and Wellbutrin on last visit.  He found the patches to be very helpful.  He was able to cut down from 2 packs a day to 1 pack a day.  He is not sure whether the Wellbutrin helps.    MDD:  No improvement in mood with Wellbutrin.  PHQ-9 score higher today compared to last visit.  He reports being tried with Effexor and Zoloft while in prison and did not find them to be helpful.  I had referred him for counseling on last visit.  However I received a message back stating that patient needs to call and find out which provider is in network for his insurance.  We will given the names of 3 counseling services in the [place] area including [facility], [facility] and [facility].  On last visit he C/O memory issues for 3-5 yrs.  Feels it is getting worse.  Most of it is with forgetting recent events.  It has been noticed by his girlfriend and other family members.    On last visit c/o  wgh loss.  In the past 1 year he has lost 31 pounds unintentionally. "I eat good.  I don't play around." No blood in stools, moving bowels ok.  No stomach pain No swollen lymph nodes/glands Occasional night sweats. SOB sometimes depending "on what I am doing."  + chronic cough.   HIV and hepatitis C screen were negative. He has history of hypothyroidism and is on levothyroxine.  He takes the medication every day. A1c on last visit was 6.7 Patient Active Problem List   Diagnosis Date Noted  . Tobacco dependence 10/09/2019  . Major depression, recurrent, chronic ([place]) 10/09/2019  . Memory difficulty 10/09/2019  . Hyperlipidemia associated with type 2 diabetes mellitus ([place]) 10/09/2019  . Moderate nonproliferative diabetic retinopathy of both eyes ([place]) 05/19/2019  . Hypertensive retinopathy of both eyes 05/19/2019  . DOE (dyspnea on exertion) 05/19/2019  . Upper airway cough syndrome 04/07/2019  . Essential hypertension 04/07/2019  . DM type 1 with diabetic peripheral neuropathy ([place]) 02/11/2018  . Hyperlipidemia LDL goal <70 02/11/2018  . Hypothyroidism 02/11/2018  . Chronic right shoulder pain 02/11/2018  . Chronic midline low back pain with right-sided sciatica 02/11/2018  . Anxiety 02/11/2018     Current Outpatient Medications on File Prior to Visit  Medication Sig Dispense  Refill  . ACCU-CHEK FASTCLIX LANCETS MISC Use to check blood sugar up to 5 times daily. 102 each 11  . ACCU-CHEK GUIDE test strip USE TO TEST BLOOD SUGAR UP TO 5 TIMES DAILY 100 strip 11  . Blood Glucose Monitoring Suppl (ACCU-CHEK GUIDE) w/Device KIT 1 kit by Does not apply route 5 (five) times daily. 1 kit 0  . fluticasone (FLONASE) 50 MCG/ACT nasal spray Place 2 sprays into both nostrils daily. 16 g 6  . gabapentin (NEURONTIN) 300 MG capsule Take 1 capsule (300 mg total) by mouth 3 (three) times daily. 90 capsule 3  . HUMULIN 70/30 (70-30) 100 UNIT/ML injection 47 UNITS IN AM& 40 UNITS BEFORE EVENING  MEAL.LOWER NIGHT DOSE TO 35 UNITS IF BG 70 OR LESS OVERNIGHT 30 mL 1  . ibuprofen (ADVIL) 600 MG tablet Take 1 tablet (600 mg total) by mouth every 6 (six) hours as needed. 40 tablet 0  . Insulin Detemir (LEVEMIR FLEXTOUCH) 100 UNIT/ML Pen Inject into the skin.    . INSULIN SYRINGE .5CC/29G (B-D INSULIN SYRINGE) 29G X 1/2" 0.5 ML MISC USE TO INJECT INSULIN EVERY DAY. 100 each 2  . levothyroxine (SYNTHROID, LEVOTHROID) 175 MCG tablet Take 1 tablet (175 mcg total) by mouth daily before breakfast. 30 tablet 5  . nicotine (NICODERM CQ - DOSED IN MG/24 HOURS) 21 mg/24hr patch Place 1 patch (21 mg total) onto the skin daily. 28 patch 1  . NOVOLOG FLEXPEN 100 UNIT/ML FlexPen INJECT 15 UNITS SUBCUTANEOUSLY 3TIMES DAILY W/MEALS. ADD SLIDING SCALE IF SUGAR 100. MAX 75UNITS/DAY    . pantoprazole (PROTONIX) 40 MG tablet TAKE 30- 60 MIN BEFORE YOUR FIRST AND LAST MEALS OF THE DAY 60 tablet 2  . rosuvastatin (CRESTOR) 40 MG tablet Take by mouth.    . sildenafil (VIAGRA) 100 MG tablet Take 0.5-1 tablets (50-100 mg total) by mouth daily as needed for erectile dysfunction. 5 tablet 5  . telmisartan (MICARDIS) 40 MG tablet TAKE 1 TABLET BY MOUTH EVERY DAY 30 tablet 11   No current facility-administered medications on file prior to visit.    No Known Allergies  Social History   Socioeconomic History  . Marital status: Single    Spouse name: Not on file  . Number of children: Not on file  . Years of education: Not on file  . Highest education level: Not on file  Occupational History  . Not on file  Tobacco Use  . Smoking status: Current Every Day Smoker    Packs/day: 1.00    Years: 30.00    Pack years: 30.00    Types: Cigarettes  . Smokeless tobacco: Never Used  Vaping Use  . Vaping Use: Never used  Substance and Sexual Activity  . Alcohol use: Not Currently  . Drug use: Yes    Types: Marijuana    Comment: Several times per week  . Sexual activity: Yes  Other Topics Concern  . Not on file    Social History Narrative   Right handed    Caffeine 2 cups per day    Lives at home with his mom    Social Determinants of Health   Financial Resource Strain:   . Difficulty of Paying Living Expenses: Not on file  Food Insecurity:   . Worried About Charity fundraiser in the Last Year: Not on file  . Ran Out of Food in the Last Year: Not on file  Transportation Needs:   . Lack of Transportation (Medical): Not on file  .  Lack of Transportation (Non-Medical): Not on file  Physical Activity:   . Days of Exercise per Week: Not on file  . Minutes of Exercise per Session: Not on file  Stress:   . Feeling of Stress : Not on file  Social Connections:   . Frequency of Communication with Friends and Family: Not on file  . Frequency of Social Gatherings with Friends and Family: Not on file  . Attends Religious Services: Not on file  . Active Member of Clubs or Organizations: Not on file  . Attends Archivist Meetings: Not on file  . Marital Status: Not on file  Intimate Partner Violence:   . Fear of Current or Ex-Partner: Not on file  . Emotionally Abused: Not on file  . Physically Abused: Not on file  . Sexually Abused: Not on file    Family History  Problem Relation Age of Onset  . Diabetes Mother   . Cancer Mother        Brain, breast   . Diabetes Sister   . Diabetes Maternal Grandmother     Past Surgical History:  Procedure Laterality Date  . NO PAST SURGERIES      ROS: Review of Systems Negative except as stated above  PHYSICAL EXAM: BP 121/74   Pulse 69   Resp 16   Wt 187 lb 6.4 oz (85 kg)   SpO2 98%   BMI 26.89 kg/m   Physical Exam  General appearance - alert, well appearing, middle-aged Caucasian male.  He appears uncomfortable while sitting at times.  He holds his right lower back with his right hand.   Mental status - normal mood, behavior, speech, dress, motor activity, and thought processes Neck -no palpable masses.  No  thyromegaly. Lymphatics - no palpable lymphadenopathy, no hepatosplenomegaly Chest - clear to auscultation, no wheezes, rales or rhonchi, symmetric air entry Heart - normal rate, regular rhythm, normal S1, S2, no murmurs, rubs, clicks or gallops Abdomen - soft, nontender, nondistended, no masses or organomegaly Musculoskeletal -mild nodular ?bony prominence felt in the right upper lumbar region.  It is about about 1 to 2 cm in size and not movable.  No tenderness on palpation of the lower thoracic and lumbar spine.  Straight leg raise negative bilaterally.  Power in both lower extremities 5/5 bilaterally.  Gait is normal. Extremities -no lower extremity edema  Depression screen [facility] 2/9 11/20/2019 10/09/2019 07/30/2019  Decreased Interest _0 Down, Depressed, Hopeless 3 - 3  PHQ - 2 Score _1 Altered sleeping _2 Tired, decreased energy _3 Change in appetite _4 Feeling bad or failure about yourself  _5 Trouble concentrating _6 Moving slowly or fidgety/restless 0 0 1  Suicidal thoughts 0 0 1  PHQ-9 Score _7 Difficult doing work/chores - - -  Some recent data might be hidden   MMSE - Mini Mental State Exam 11/20/2019  Orientation to time 5  Orientation to Place 5  Registration 3  Attention/ Calculation 0  Recall 0  Language- name 2 objects 2  Language- repeat 1  Language- follow 3 step command 3  Language- read & follow direction 1  Write a sentence 1  Copy design 1  Total score 22    CMP Latest Ref Rng & Units 09/04/2019 05/19/2019 10/31/2018  Glucose 70 - 99 mg/dL - 114(H) -  BUN 6 - 23 mg/dL -  7 -  Creatinine 0.61 - 1.24 mg/dL 1.00 0.94 -  Sodium 135 - 145 mEq/L - 132(L) -  Potassium 3.5 - 5.1 mEq/L - 4.5 -  Chloride 96 - 112 mEq/L - 97 -  CO2 19 - 32 mEq/L - 31 -  Calcium 8.4 - 10.5 mg/dL - 9.2 -  Total Protein 6.0 - 8.5 g/dL - - 6.6  Total Bilirubin 0.0 - 1.2 mg/dL - - -  Alkaline Phos 39 - 117 IU/L - - -  AST 0 - 40 IU/L - - -  ALT 0 -  44 IU/L - - -   Lipid Panel     Component Value Date/Time   CHOL 226 (H) 09/11/2018 0954   TRIG 89 09/11/2018 0954   HDL 65 09/11/2018 0954   CHOLHDL 3.5 09/11/2018 0954   LDLCALC 143 (H) 09/11/2018 0954    CBC    Component Value Date/Time   WBC 9.0 05/19/2019 1528   RBC 4.89 05/19/2019 1528   HGB 15.8 05/19/2019 1528   HGB 16.1 09/11/2018 0954   HCT 46.0 05/19/2019 1528   HCT 46.8 09/11/2018 0954   PLT 313.0 05/19/2019 1528   PLT 284 09/11/2018 0954   MCV 94.0 05/19/2019 1528   MCV 95 09/11/2018 0954   MCH 32.5 09/11/2018 0954   MCH 31.6 04/01/2018 1327   MCHC 34.4 05/19/2019 1528   RDW 13.0 05/19/2019 1528   RDW 12.5 09/11/2018 0954   LYMPHSABS 2.7 05/19/2019 1528   LYMPHSABS 2.5 09/11/2018 0954   MONOABS 0.5 05/19/2019 1528   EOSABS 0.1 05/19/2019 1528   EOSABS 0.2 09/11/2018 0954   BASOSABS 0.1 05/19/2019 1528   BASOSABS 0.1 09/11/2018 0954    ASSESSMENT AND PLAN: 1. Acute right-sided low back pain without sciatica Given his history I suspect that this is a strain from lifting heavy wood the day prior to onset.  Symptoms are nonradicular.  He does have some tenderness on the nonmovable 1 to 2 cm mass felt.  I think this is either the bony prominence or a cyst. -I recommend that he avoid any further heavy lifting for the next few weeks.  I have given a limited supply of Flexeril and tramadol to use as needed for the pain.  Advised that both medications can cause drowsiness and not to take them when he has to drive or operate machinery.  Use heating pad PRN We will get an x-ray to see if there is a bony prominence in the area where the nodule is felt. - cyclobenzaprine (FLEXERIL) 5 MG tablet; Take 1 tablet (5 mg total) by mouth 2 (two) times daily as needed for muscle spasms.  Dispense: 30 tablet; Refill: 0 - traMADol (ULTRAM) 50 MG tablet; Take 1 tablet (50 mg total) by mouth every 12 (twelve) hours as needed.  Dispense: 15 tablet; Refill: 0  2. Tobacco  dependence Commended him on cutting back.  Encouraged him to keep going with the goals of quitting.  Patient states that his goal is to quit.  He will continue the nicotine patches.  He is agreeable to trying the nicotine gum with it.  Less than 5 minutes spent on counseling. - nicotine polacrilex (NICORETTE) 2 MG gum; Take 1 each (2 mg total) by mouth as needed for smoking cessation. Max of 30 pieces/24 hr  Dispense: 100 tablet; Refill: 0  3. Major depression, recurrent, chronic (HCC) Patient wanted to discontinue the Wellbutrin as he does not find it helpful..  We discussed trying him  with a different antidepressant.  He is agreeable to trying Prozac.  Advised that if he has any increasing depression or suicidal ideation on the medication he should call and let us know. -Patient given the names and phone numbers of 3 behavioral health services in the [place] area.  He will call his insurance to find out which of these are he needs network.  He will then call me back to let me know which one to submit a referral to.  If none are in network, we will refer him to services here in [place]. - FLUoxetine (PROZAC) 10 MG capsule; Take 1 capsule (10 mg total) by mouth daily.  Dispense: 30 capsule; Refill: 1  4. Memory difficulty Patient scored 22 out of 30 on Mini-Mental status today suggesting some cognitive impairment.  Depression can cause memory difficulties.  We will monitor over time and if this does not improve with antidepressant, then we will refer him to the neurologist. - Vitamin B12  5. Unexplained weight loss Of questionable etiology.  Patient reports good appetite.  We will check a TSH to make sure that his dose is appropriate.  HIV screening was negative.  If labs today are okay, the next step would be to get imaging of chest and abdomen to rule out any occult malignancy. - Comprehensive metabolic panel - CBC  6. Hypothyroidism, unspecified type - TSH  7. DM type 1 with diabetic  peripheral neuropathy ([place]) This was not addressed today. - POCT glucose (manual entry)   Patient was given the opportunity to ask questions.  Patient verbalized understanding of the plan and was able to repeat key elements of the plan.   Orders Placed This Encounter  Procedures  . Comprehensive metabolic panel  . CBC  . TSH  . Vitamin B12  . POCT glucose (manual entry)     Requested Prescriptions   Signed Prescriptions Disp Refills  . nicotine polacrilex (NICORETTE) 2 MG gum 100 tablet 0    Sig: Take 1 each (2 mg total) by mouth as needed for smoking cessation. Max of 30 pieces/24 hr  . FLUoxetine (PROZAC) 10 MG capsule 30 capsule 1    Sig: Take 1 capsule (10 mg total) by mouth daily.  . cyclobenzaprine (FLEXERIL) 5 MG tablet 30 tablet 0    Sig: Take 1 tablet (5 mg total) by mouth 2 (two) times daily as needed for muscle spasms.  . traMADol (ULTRAM) 50 MG tablet 15 tablet 0    Sig: Take 1 tablet (50 mg total) by mouth every 12 (twelve) hours as needed.    No follow-ups on file.  Karle Plumber, MD, FACP

## 2019-11-21 LAB — COMPREHENSIVE METABOLIC PANEL
ALT: 14 IU/L (ref 0–44)
AST: 20 IU/L (ref 0–40)
Albumin/Globulin Ratio: 2.1 (ref 1.2–2.2)
Albumin: 4.2 g/dL (ref 4.0–5.0)
Alkaline Phosphatase: 80 IU/L (ref 44–121)
BUN/Creatinine Ratio: 8 — ABNORMAL LOW (ref 9–20)
BUN: 8 mg/dL (ref 6–24)
Bilirubin Total: 0.4 mg/dL (ref 0.0–1.2)
CO2: 24 mmol/L (ref 20–29)
Calcium: 9.3 mg/dL (ref 8.7–10.2)
Chloride: 100 mmol/L (ref 96–106)
Creatinine, Ser: 0.99 mg/dL (ref 0.76–1.27)
GFR calc Af Amer: 105 mL/min/{1.73_m2} (ref >59)
GFR calc non Af Amer: 91 mL/min/{1.73_m2} (ref >59)
Globulin, Total: 2 g/dL (ref 1.5–4.5)
Glucose: 77 mg/dL (ref 65–99)
Potassium: 4.8 mmol/L (ref 3.5–5.2)
Sodium: 137 mmol/L (ref 134–144)
Total Protein: 6.2 g/dL (ref 6.0–8.5)

## 2019-11-21 LAB — CBC
Hematocrit: 47.2 % (ref 37.5–51.0)
Hemoglobin: 16.1 g/dL (ref 13.0–17.7)
MCH: 31.6 pg (ref 26.6–33.0)
MCHC: 34.1 g/dL (ref 31.5–35.7)
MCV: 93 fL (ref 79–97)
Platelets: 368 10*3/uL (ref 150–450)
RBC: 5.09 x10E6/uL (ref 4.14–5.80)
RDW: 12.1 % (ref 11.6–15.4)
WBC: 15.1 10*3/uL — ABNORMAL HIGH (ref 3.4–10.8)

## 2019-11-21 LAB — TSH: TSH: 0.115 u[IU]/mL — ABNORMAL LOW (ref 0.450–4.500)

## 2019-11-21 LAB — VITAMIN B12: Vitamin B-12: 566 pg/mL (ref 232–1245)

## 2019-11-23 NOTE — Progress Notes (Signed)
Let pt know that his thyroid level  (TSH is 0.155) is abnormal suggesting that the dose of his Levothyroxine needs to be lowered.  To high a dose can cause or contribute to weigh loss.  He should call his endocrinologist Dr. Honor Junes to let him know so that his dose can be adjusted. After dose is adjusted, please follow up with me in 4-6 weeks for me to see if his weigh has stabilized.  If it has not, then we will need to move forward with getting a CAT scan of his chest, abdomen and pelvis looking for any cancerous lesions that may be causing weight loss.   Please also fax a copy of his lab results to Dr. Mee Hives with [facility] [phone].

## 2019-11-24 NOTE — Addendum Note (Signed)
Addended by: Karle Plumber B on: 11/24/2019 12:17 PM   Modules accepted: Orders

## 2019-11-26 LAB — SPECIMEN STATUS REPORT

## 2019-11-29 ENCOUNTER — Other Ambulatory Visit: Payer: Self-pay | Admitting: Internal Medicine

## 2019-12-07 ENCOUNTER — Other Ambulatory Visit: Payer: Self-pay | Admitting: Internal Medicine

## 2019-12-07 DIAGNOSIS — M545 Low back pain, unspecified: Secondary | ICD-10-CM

## 2019-12-07 NOTE — Telephone Encounter (Signed)
Requested medication (s) are due for refill today: no  Requested medication (s) are on the active medication list: yes   Last refill: 11/20/2019  Future visit scheduled:no  Notes to clinic:  this refill cannot be delegated    Requested Prescriptions  Pending Prescriptions Disp Refills   cyclobenzaprine (FLEXERIL) 5 MG tablet [Pharmacy Med Name: CYCLOBENZAPRINE 5 MG TABLET] 30 tablet 0    Sig: TAKE 1 TABLET BY MOUTH 2 TIMES DAILY AS NEEDED FOR MUSCLE SPASMS.      Not Delegated - Analgesics:  Muscle Relaxants Failed - 12/07/2019  9:03 AM      Failed - This refill cannot be delegated      Passed - Valid encounter within last 6 months    Recent Outpatient Visits           2 weeks ago Acute right-sided low back pain without sciatica   [place] Karle Plumber B, MD   1 month ago DM type 1 with diabetic peripheral neuropathy [facility])   [place], Deborah B, MD   4 months ago Uncontrolled type 1 diabetes mellitus with hyperglycemia [facility])   [place], [place], [place]   5 months ago Uncontrolled type 1 diabetes mellitus with hyperglycemia [facility])   [place], [place], [place]   9 months ago Recurrent productive cough   [facility] [place], [place], [place]

## 2019-12-12 ENCOUNTER — Other Ambulatory Visit: Payer: Self-pay | Admitting: Internal Medicine

## 2019-12-12 DIAGNOSIS — F339 Major depressive disorder, recurrent, unspecified: Secondary | ICD-10-CM

## 2019-12-12 NOTE — Telephone Encounter (Signed)
   Notes to clinic:  requesting a 90 day supply   Requested Prescriptions  Pending Prescriptions Disp Refills   FLUoxetine (PROZAC) 10 MG capsule [Pharmacy Med Name: FLUOXETINE HCL 10 MG CAPSULE] 90 capsule 1    Sig: TAKE 1 CAPSULE BY MOUTH EVERY DAY      Psychiatry:  Antidepressants - SSRI Passed - 12/12/2019  8:31 AM      Passed - Completed PHQ-2 or PHQ-9 in the last 360 days      Passed - Valid encounter within last 6 months    Recent Outpatient Visits           3 weeks ago Acute right-sided low back pain without sciatica   [place] Karle Plumber B, MD   2 months ago DM type 1 with diabetic peripheral neuropathy [facility])   [place], Deborah B, MD   4 months ago Uncontrolled type 1 diabetes mellitus with hyperglycemia [facility])   [place], [place], [place]   5 months ago Uncontrolled type 1 diabetes mellitus with hyperglycemia [facility])   [place], [place], [place]   9 months ago Recurrent productive cough   [facility] [place], [place], [place]

## 2019-12-20 ENCOUNTER — Other Ambulatory Visit: Payer: Self-pay | Admitting: Internal Medicine

## 2019-12-20 DIAGNOSIS — M545 Low back pain, unspecified: Secondary | ICD-10-CM

## 2019-12-20 NOTE — Telephone Encounter (Signed)
Requested medication (s) are due for refill today: yes  Requested medication (s) are on the active medication list: yes  Last refill:  12/08/19  Future visit scheduled: no  Notes to clinic:  med not delegated to NT to RF   Requested Prescriptions  Pending Prescriptions Disp Refills   cyclobenzaprine (FLEXERIL) 5 MG tablet [Pharmacy Med Name: CYCLOBENZAPRINE 5 MG TABLET] 30 tablet 0    Sig: TAKE 1 TABLET BY MOUTH 2 TIMES DAILY AS NEEDED FOR MUSCLE SPASMS.      Not Delegated - Analgesics:  Muscle Relaxants Failed - 12/20/2019  9:04 AM      Failed - This refill cannot be delegated      Passed - Valid encounter within last 6 months    Recent Outpatient Visits           1 month ago Acute right-sided low back pain without sciatica   [place] Karle Plumber B, MD   2 months ago DM type 1 with diabetic peripheral neuropathy [facility]   [place], Deborah B, MD   4 months ago Uncontrolled type 1 diabetes mellitus with hyperglycemia [facility])   [place], [place], [place]   5 months ago Uncontrolled type 1 diabetes mellitus with hyperglycemia [facility])   [place], [place], [place]   10 months ago Recurrent productive cough   [place] [facility] [place], [place], MD

## 2019-12-25 ENCOUNTER — Telehealth: Payer: Self-pay | Admitting: Internal Medicine

## 2019-12-25 NOTE — Telephone Encounter (Signed)
Could you contact pt and schedule a tele

## 2019-12-25 NOTE — Telephone Encounter (Signed)
Will forward to pcp for additional advice

## 2019-12-25 NOTE — Telephone Encounter (Signed)
Copied from [place] [id number]. Topic: General - Inquiry >> Dec 23, 2019 12:47 PM Gillis Ends D wrote: Reason for CRM: Patient wants a call back from Dr. Durenda Age nurse. He said he is still having a lot of pain in his back. He can be reached at [phone]. Please advise  Please follow up.

## 2019-12-26 NOTE — Telephone Encounter (Signed)
Called patient and LVM to call back and schedule tele in the next 1-2 weeks.

## 2019-12-30 ENCOUNTER — Other Ambulatory Visit: Payer: Self-pay

## 2019-12-30 DIAGNOSIS — E1042 Type 1 diabetes mellitus with diabetic polyneuropathy: Secondary | ICD-10-CM

## 2019-12-30 MED ORDER — GABAPENTIN 300 MG PO CAPS: 300.0000 mg | ORAL_CAPSULE | Freq: Three times a day (TID) | ORAL | 3 refills | Status: DC

## 2020-01-03 ENCOUNTER — Other Ambulatory Visit: Payer: Self-pay | Admitting: Internal Medicine

## 2020-01-03 DIAGNOSIS — M545 Low back pain, unspecified: Secondary | ICD-10-CM

## 2020-01-03 NOTE — Telephone Encounter (Signed)
Requested medication (s) are due for refill today: yes  Requested medication (s) are on the active medication list: yes  Last refill:  12/22/19  Future visit scheduled: yes  Notes to clinic:  med not delegated to NT to RF   Requested Prescriptions  Pending Prescriptions Disp Refills   cyclobenzaprine (FLEXERIL) 5 MG tablet [Pharmacy Med Name: CYCLOBENZAPRINE 5 MG TABLET] 30 tablet 0    Sig: TAKE 1 TABLET BY MOUTH 2 TIMES DAILY AS NEEDED FOR MUSCLE SPASMS.      Not Delegated - Analgesics:  Muscle Relaxants Failed - 01/03/2020  9:09 AM      Failed - This refill cannot be delegated      Passed - Valid encounter within last 6 months    Recent Outpatient Visits           1 month ago Acute right-sided low back pain without sciatica   [place] Karle Plumber B, MD   2 months ago DM type 1 with diabetic peripheral neuropathy [facility])   [place], MD   5 months ago Uncontrolled type 1 diabetes mellitus with hyperglycemia [facility])   [place], [place], [place]   6 months ago Uncontrolled type 1 diabetes mellitus with hyperglycemia [facility])   [place], [place], [place]   10 months ago Recurrent productive cough   [place], MD       Future Appointments             In 2 days Ladell Pier, MD [place]

## 2020-01-05 ENCOUNTER — Other Ambulatory Visit: Payer: Self-pay

## 2020-01-05 ENCOUNTER — Ambulatory Visit: Payer: Medicaid Other | Attending: Internal Medicine | Admitting: Internal Medicine

## 2020-01-05 DIAGNOSIS — M545 Low back pain, unspecified: Secondary | ICD-10-CM | POA: Diagnosis not present

## 2020-01-05 DIAGNOSIS — F339 Major depressive disorder, recurrent, unspecified: Secondary | ICD-10-CM

## 2020-01-05 DIAGNOSIS — M898X9 Other specified disorders of bone, unspecified site: Secondary | ICD-10-CM

## 2020-01-05 DIAGNOSIS — R634 Abnormal weight loss: Secondary | ICD-10-CM | POA: Diagnosis not present

## 2020-01-05 MED ORDER — TRAMADOL HCL 50 MG PO TABS: 50.0000 mg | ORAL_TABLET | Freq: Two times a day (BID) | ORAL | 0 refills | Status: DC | PRN

## 2020-01-05 NOTE — Progress Notes (Signed)
Virtual Visit via Telephone Note  I connected with Ronald Banks on 01/05/20 at 8:45 a.m by telephone and verified that I am speaking with the correct person using two identifiers.  Location: Patient: home Provider: office The patient, myself and CMA Sallyanne Havers participated in this visit.   I discussed the limitations, risks, security and privacy concerns of performing an evaluation and management service by telephone and the availability of in person appointments. I also discussed with the patient that there may be a patient responsible charge related to this service. The patient expressed understanding and agreed to proceed.   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  Patient last seen 11/20/2019  Patient requested follow-up for his lower back pain  11/20/19: Patient complains of pain and stiffness in the right lower back x3 days.  He feels it is coming from a bony protrusion in the right lower back which he had not noticed until 3 days ago.  He reports that the pain kept him up all night.  Better with walking, worse with sitting and laying down.  No radiation of pain down the legs.  No numbness or tingling in the legs.  No loss of bowel or bladder function. Not sure of initiating factors but started the day after he did a lot of heavy lifting of wood repetitively up to 150 pounds.   Taking Tylenol and Ibuprofen and using icy Hot; none of these have helped. . On exam he was found to have a mild nodular not movable questionable bony prominence felt in the right upper lumbar region about 1 to 2 cm in size.  There was no tenderness on palpation of the lower thoracic and lumbar spine.  Straight leg raise was negative bilaterally.  Power was normal and gait normal.  The mass thought to be a bony prominence or cysts.   Overall I suspected his back pain was due to strain from heavy lifting would the day prior to onset.  I recommended no further lifting for the next few weeks.  I gave some Flexeril and tramadol to use as needed.  X-ray of the lumbar spine was negative.  Today: Patient reports he still having pain in the lower back on the right side and mainly in the area of the bony prominence.  Pain does not radiate, there is no associated numbness or tingling.  She feels that his hip on the right side sages some.  Reports that the tramadol helps some.  On last visit we had also started him on Prozac to help with symptoms of major depression.  He does not feel the Prozac has helped.  He also did not find Wellbutrin, Effexor or Zoloft helpful in the past.  He thinks it has made him more depressed.  We had given him the names of 3 behavioral health providers in the [place] area.  He was told to call and see which 1 of those his insurance takes.  Patient states he has called and was told that he had to come in to schedule an appointment.  He plans to do that sometime this month.  On last visit his TSH was also abnormal.  I sent him a MyChart message telling him to touch base with his endocrinologist.  Patient states he did and his levothyroxine dose was adjusted to 125 mcg daily except on Sundays when he  would take 2 tablets.  Weight today is 185 pounds.  Outpatient Encounter Medications as of 01/05/2020  Medication Sig  . ACCU-CHEK FASTCLIX LANCETS MISC Use to check blood sugar up to 5 times daily.  Marland Kitchen ACCU-CHEK GUIDE test strip USE TO TEST BLOOD SUGAR UP TO 5 TIMES DAILY  . Blood Glucose Monitoring Suppl (ACCU-CHEK GUIDE) w/Device KIT 1 kit by Does not apply route 5 (five) times daily.  . cyclobenzaprine (FLEXERIL) 5 MG tablet TAKE 1 TABLET BY MOUTH 2 TIMES DAILY AS NEEDED FOR MUSCLE SPASMS.  Marland Kitchen FLUoxetine (PROZAC) 10 MG capsule TAKE 1 CAPSULE BY MOUTH EVERY DAY  . fluticasone (FLONASE) 50 MCG/ACT nasal spray Place  2 sprays into both nostrils daily.  Marland Kitchen gabapentin (NEURONTIN) 300 MG capsule Take 1 capsule (300 mg total) by mouth 3 (three) times daily.  Marland Kitchen HUMULIN 70/30 (70-30) 100 UNIT/ML injection 47 UNITS IN AM& 40 UNITS BEFORE EVENING MEAL.LOWER NIGHT DOSE TO 35 UNITS IF BG 70 OR LESS OVERNIGHT  . ibuprofen (ADVIL) 600 MG tablet Take 1 tablet (600 mg total) by mouth every 6 (six) hours as needed.  . Insulin Detemir (LEVEMIR FLEXTOUCH) 100 UNIT/ML Pen Inject into the skin.  . INSULIN SYRINGE .5CC/29G (B-D INSULIN SYRINGE) 29G X 1/2" 0.5 ML MISC USE TO INJECT INSULIN EVERY DAY.  Marland Kitchen levothyroxine (SYNTHROID, LEVOTHROID) 175 MCG tablet Take 1 tablet (175 mcg total) by mouth daily before breakfast.  . nicotine (NICODERM CQ - DOSED IN MG/24 HOURS) 21 mg/24hr patch Place 1 patch (21 mg total) onto the skin daily.  . nicotine polacrilex (NICORETTE) 2 MG gum Take 1 each (2 mg total) by mouth as needed for smoking cessation. Max of 30 pieces/24 hr  . NOVOLOG FLEXPEN 100 UNIT/ML FlexPen INJECT 15 UNITS SUBCUTANEOUSLY 3TIMES DAILY W/MEALS. ADD SLIDING SCALE IF SUGAR 100. MAX 75UNITS/DAY  . pantoprazole (PROTONIX) 40 MG tablet TAKE 30- 60 MIN BEFORE YOUR FIRST AND LAST MEALS OF THE DAY  . rosuvastatin (CRESTOR) 40 MG tablet Take by mouth.  . sildenafil (VIAGRA) 100 MG tablet Take 0.5-1 tablets (50-100 mg total) by mouth daily as needed for erectile dysfunction.  Marland Kitchen telmisartan (MICARDIS) 40 MG tablet TAKE 1 TABLET BY MOUTH EVERY DAY  . traMADol (ULTRAM) 50 MG tablet Take 1 tablet (50 mg total) by mouth every 12 (twelve) hours as needed.   No facility-administered encounter medications on file as of 01/05/2020.      Observations/Objective: Results for orders placed or performed in visit on 11/20/19  Comprehensive metabolic panel  Result Value Ref Range   Glucose 77 65 - 99 mg/dL   BUN 8 6 - 24 mg/dL   Creatinine, Ser 0.99 0.76 - 1.27 mg/dL   GFR calc non Af Amer 91 >59 mL/min/1.73   GFR calc Af Amer 105 >59  mL/min/1.73   BUN/Creatinine Ratio 8 (L) 9 - 20   Sodium 137 134 - 144 mmol/L   Potassium 4.8 3.5 - 5.2 mmol/L   Chloride 100 96 - 106 mmol/L   CO2 24 20 - 29 mmol/L   Calcium 9.3 8.7 - 10.2 mg/dL   Total Protein 6.2 6.0 - 8.5 g/dL   Albumin 4.2 4.0 - 5.0 g/dL   Globulin, Total 2.0 1.5 - 4.5 g/dL   Albumin/Globulin Ratio 2.1 1.2 - 2.2   Bilirubin Total 0.4 0.0 - 1.2 mg/dL   Alkaline Phosphatase 80 44 - 121 IU/L   AST 20 0 - 40 IU/L   ALT 14 0 - 44 IU/L  CBC  Result Value Ref Range   WBC 15.1 (H) 3.4 - 10.8 x10E3/uL   RBC 5.09 4.14 - 5.80 x10E6/uL   Hemoglobin 16.1 13.0 - 17.7 g/dL   Hematocrit 47.2 37.5 - 51.0 %   MCV 93 79 - 97 fL   MCH 31.6 26.6 - 33.0 pg   MCHC 34.1 31.5 - 35.7 g/dL   RDW 12.1 11.6 - 15.4 %   Platelets 368 150 - 450 x10E3/uL  TSH  Result Value Ref Range   TSH 0.115 (L) 0.450 - 4.500 uIU/mL  Vitamin B12  Result Value Ref Range   Vitamin B-12 566 232 - 1,245 pg/mL  Specimen status report  Result Value Ref Range   specimen status report Comment   POCT glucose (manual entry)  Result Value Ref Range   POC Glucose 163 (A) 70 - 99 mg/dl     Assessment and Plan: 1. Acute right-sided low back pain without sciatica 2. Bone mass Since this issue has persisted, I think the next step would be to get an MRI to evaluate this further.  Patient requested a refill on tramadol to use as needed until then. - MR Lumbar Spine Wo Contrast; Future  3. Major depression, recurrent, chronic (HCC) Advised to discontinue the Prozac since he feels it makes him more depressed.  Encouraged him to get established with one of the 3 behavioral health providers in the [place] area.  We have provided him with the names and phone numbers of pills on his last visit.  4. Unexplained weight loss Patient's levothyroxine dose has been adjusted.  We will see whether his weight stabilizes.   Follow Up Instructions: 1 mth   I discussed the assessment and treatment plan with the  patient. The patient was provided an opportunity to ask questions and all were answered. The patient agreed with the plan and demonstrated an understanding of the instructions.   The patient was advised to call back or seek an in-person evaluation if the symptoms worsen or if the condition fails to improve as anticipated.  I provided 11 minutes of non-face-to-face time during this encounter.   Karle Plumber, MD

## 2020-01-06 ENCOUNTER — Other Ambulatory Visit: Payer: Self-pay | Admitting: Internal Medicine

## 2020-01-06 MED ORDER — PANTOPRAZOLE SODIUM 40 MG PO TBEC: 40.0000 mg | DELAYED_RELEASE_TABLET | Freq: Two times a day (BID) | ORAL | 0 refills | Status: DC

## 2020-01-06 MED ORDER — PANTOPRAZOLE SODIUM 40 MG PO TBEC: DELAYED_RELEASE_TABLET | ORAL | 0 refills | Status: DC

## 2020-01-09 ENCOUNTER — Other Ambulatory Visit: Payer: Self-pay | Admitting: Internal Medicine

## 2020-01-13 ENCOUNTER — Ambulatory Visit (HOSPITAL_COMMUNITY): Payer: Medicaid Other

## 2020-01-15 ENCOUNTER — Telehealth: Payer: Self-pay | Admitting: Internal Medicine

## 2020-01-15 NOTE — Telephone Encounter (Signed)
Copied from [place] [phone]. Topic: General - Other >> Jan 15, 2020  2:52 PM Celene Kras wrote: Reason for CRM: Pt called and is requesting to have a call back from the nurse regarding having to reschedule his MRI. Please advise.

## 2020-01-15 NOTE — Telephone Encounter (Signed)
Please give pt the number to scheduling to reschedule appt [phone]

## 2020-01-18 ENCOUNTER — Other Ambulatory Visit: Payer: Self-pay | Admitting: Internal Medicine

## 2020-01-18 DIAGNOSIS — M545 Low back pain, unspecified: Secondary | ICD-10-CM

## 2020-01-18 NOTE — Telephone Encounter (Signed)
Requested medication (s) are due for refill today: yes  Requested medication (s) are on the active medication list: yes  Last refill:  01/07/20  Future visit scheduled: no  Notes to clinic:  med not delegated to NT to RF   Requested Prescriptions  Pending Prescriptions Disp Refills   cyclobenzaprine (FLEXERIL) 5 MG tablet [Pharmacy Med Name: CYCLOBENZAPRINE 5 MG TABLET] 30 tablet 0    Sig: TAKE 1 TABLET BY MOUTH 2 TIMES DAILY AS NEEDED FOR MUSCLE SPASMS.      Not Delegated - Analgesics:  Muscle Relaxants Failed - 01/18/2020  9:17 AM      Failed - This refill cannot be delegated      Passed - Valid encounter within last 6 months    Recent Outpatient Visits           1 week ago Acute right-sided low back pain without sciatica   [place] Karle Plumber B, MD   1 month ago Acute right-sided low back pain without sciatica   [place], MD   3 months ago DM type 1 with diabetic peripheral neuropathy [facility])   [place], MD   5 months ago Uncontrolled type 1 diabetes mellitus with hyperglycemia [facility])   [place], [place], [place]   6 months ago Uncontrolled type 1 diabetes mellitus with hyperglycemia [facility])   [place], [place], [place]

## 2020-01-20 NOTE — Telephone Encounter (Signed)
Called patient and LVM with the number to reschedule MRI appointment.

## 2020-01-21 ENCOUNTER — Ambulatory Visit (HOSPITAL_COMMUNITY): Admission: RE | Admit: 2020-01-21 | Payer: Medicaid Other | Source: Ambulatory Visit

## 2020-01-22 ENCOUNTER — Telehealth: Payer: Self-pay

## 2020-01-22 NOTE — Telephone Encounter (Signed)
Contacted [employer] and spoke to [place] intake representative    [phone] MRI [place] ICD- M54.50 Acute right-sided low back pain without sciatica          M89.8X9 Bone mass    Gave all clinical information     Per Tillie Rung they have to verify provider.    Tracking # [id number]

## 2020-01-27 ENCOUNTER — Ambulatory Visit: Payer: Medicaid Other

## 2020-01-28 ENCOUNTER — Telehealth: Payer: Self-pay | Admitting: Internal Medicine

## 2020-01-28 NOTE — Telephone Encounter (Signed)
Copied from [place] [id number]. Topic: General - Other >> Jan 27, 2020  9:21 AM Celene Kras wrote: Reason for CRM: Pt called stating that he had a MRI scheduled today and that they would not let him go to it due to his insurance not covering it. He states that he is needing this to go through [facility], and is needing to have this expedited. Please advise.   Not sure if this is a financial issue or a referral issue. Please follow up if appropriate.

## 2020-01-28 NOTE — Telephone Encounter (Signed)
Pt called [employer] and GOT AN AUTH number!!  auth:    [id number]   Pt states [facility] is closer. pt to call [phone] to schedule MRI for next Tues.  Pt aware to go to hospital

## 2020-01-28 NOTE — Telephone Encounter (Signed)
Pt states he spoke to [facility] and they told him it had not been sent.  Gave pt tracking number and he is going to call [facility] .  Advised pt I would let you know as well to follow up

## 2020-02-02 ENCOUNTER — Other Ambulatory Visit: Payer: Self-pay | Admitting: Internal Medicine

## 2020-02-02 DIAGNOSIS — M545 Low back pain, unspecified: Secondary | ICD-10-CM

## 2020-02-02 DIAGNOSIS — F17200 Nicotine dependence, unspecified, uncomplicated: Secondary | ICD-10-CM

## 2020-02-02 NOTE — Telephone Encounter (Signed)
Requested medication (s) are due for refill today:no  Requested medication (s) are on the active medication list: yes   Last refill:  01/20/2020  Future visit scheduled:no  Notes to clinic:  this refill cannot be delegated    Requested Prescriptions  Pending Prescriptions Disp Refills   cyclobenzaprine (FLEXERIL) 5 MG tablet [Pharmacy Med Name: CYCLOBENZAPRINE 5 MG TABLET] 30 tablet 0    Sig: TAKE 1 TABLET BY MOUTH 2 TIMES DAILY AS NEEDED FOR MUSCLE SPASMS.      Not Delegated - Analgesics:  Muscle Relaxants Failed - 02/02/2020 11:45 AM      Failed - This refill cannot be delegated      Passed - Valid encounter within last 6 months    Recent Outpatient Visits           4 weeks ago Acute right-sided low back pain without sciatica   [place] Karle Plumber B, MD   2 months ago Acute right-sided low back pain without sciatica   [place] Karle Plumber B, MD   3 months ago DM type 1 with diabetic peripheral neuropathy [facility])   [place] Karle Plumber B, MD   6 months ago Uncontrolled type 1 diabetes mellitus with hyperglycemia [facility])   [place], [place], [place]   7 months ago Uncontrolled type 1 diabetes mellitus with hyperglycemia [facility])   [place], [place], [place]                 Signed Prescriptions Disp Refills   nicotine (NICODERM CQ - DOSED IN MG/24 HOURS) 21 mg/24hr patch 28 patch 1    Sig: PLACE 1 PATCH (21 MG TOTAL) ONTO THE SKIN DAILY.      Psychiatry:  Drug Dependence Therapy Passed - 02/02/2020 11:45 AM      Passed - Valid encounter within last 12 months    Recent Outpatient Visits           4 weeks ago Acute right-sided low back pain without sciatica   [place] Karle Plumber B, MD   2 months ago Acute right-sided low back pain without  sciatica   [place], MD   3 months ago DM type 1 with diabetic peripheral neuropathy [facility])   [place], Deborah B, MD   6 months ago Uncontrolled type 1 diabetes mellitus with hyperglycemia [facility])   [place], [place], [place]   7 months ago Uncontrolled type 1 diabetes mellitus with hyperglycemia [facility])   [place], [place], [place]

## 2020-02-06 ENCOUNTER — Ambulatory Visit
Admission: RE | Admit: 2020-02-06 | Discharge: 2020-02-06 | Disposition: A | Discharge status: Home / Self Care | Payer: Medicaid Other | Source: Ambulatory Visit | Attending: Internal Medicine | Admitting: Internal Medicine

## 2020-02-06 ENCOUNTER — Other Ambulatory Visit: Payer: Self-pay

## 2020-02-06 DIAGNOSIS — M898X9 Other specified disorders of bone, unspecified site: Secondary | ICD-10-CM | POA: Insufficient documentation

## 2020-02-06 DIAGNOSIS — M545 Low back pain, unspecified: Secondary | ICD-10-CM | POA: Diagnosis not present

## 2020-02-06 IMAGING — MR MR LUMBAR SPINE W/O CM
5 series · 31 of 48 positions shown · non-contrast
Comparison: [DATE]

CLINICAL DATA: Low back pain with bilateral leg pain, right worse
than left

EXAM:
MRI LUMBAR SPINE WITHOUT CONTRAST
TECHNIQUE: Multiplanar, multisequence MR imaging of the lumbar spine was
performed. No intravenous contrast was administered.

[Series 5: T2 · sagittal · 4.0mm · 0.81mm/px · 6 of 15 slices shown (1 of 2)]
[im 1/15]
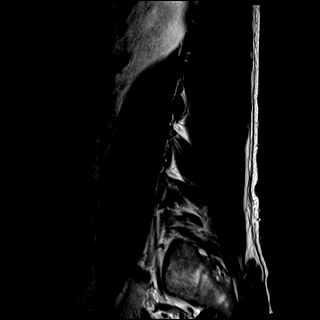
[im 3/15]
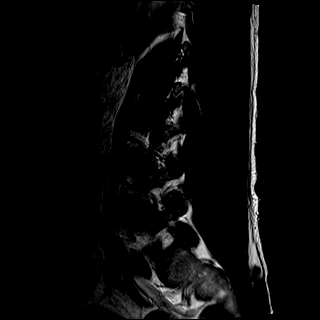
[im 6/15]
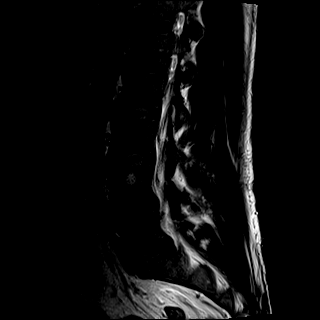
[im 9/15]
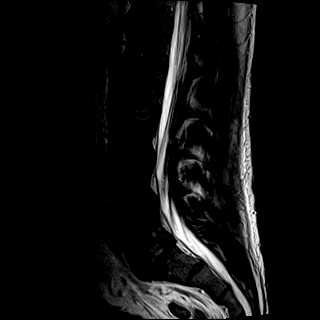
[im 12/15]
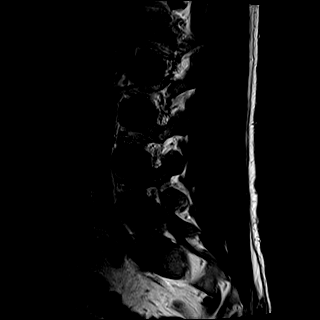
[im 15/15]
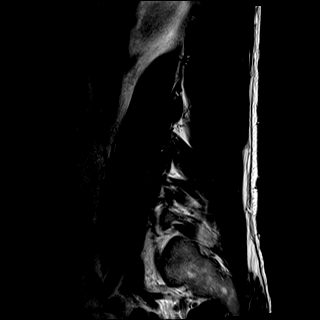

[Series 6: T1 · sagittal · 4.0mm · 0.81mm/px · 7 of 15 slices shown (1 of 2)]
[im 1/15]
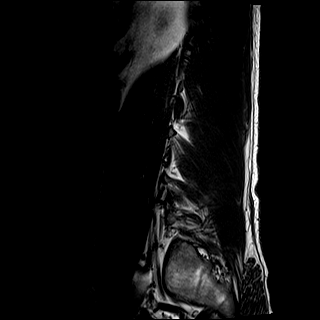
[im 3/15]
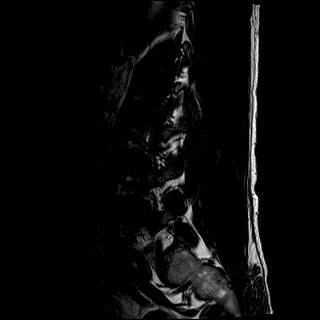
[im 5/15]
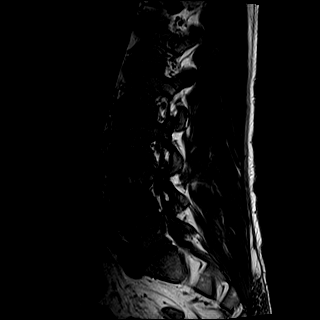
[im 8/15]
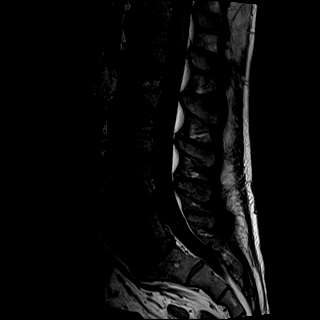
[im 10/15]
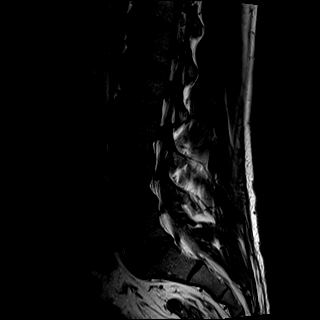
[im 12/15]
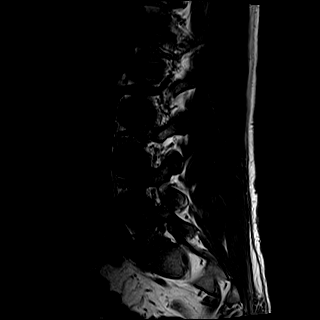
[im 15/15]
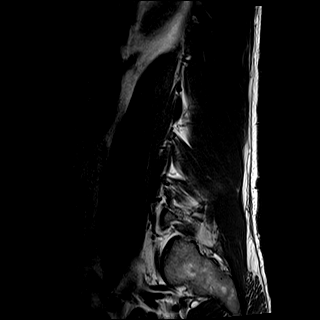

[Series 7: STIR · sagittal · 4.0mm · 0.41mm/px · 2 of 15 slices shown]
[im 1/15]
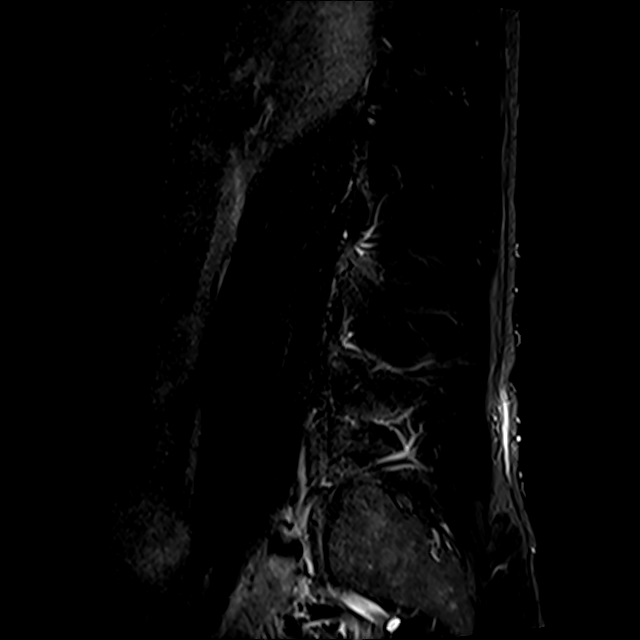
[im 3/15]
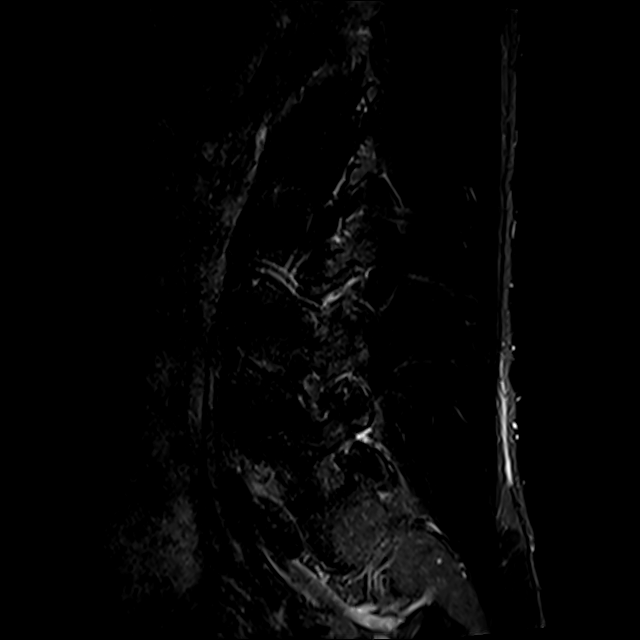

[Series 8: T2 · axial · 4.0mm · 0.78mm/px · z∈[-99,+96]mm · 8 of 31 slices shown (2 of 2)]
[im 1/31]
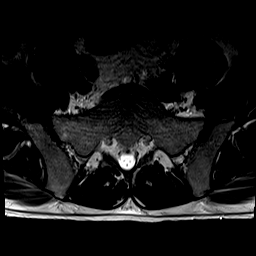
[im 5/31]
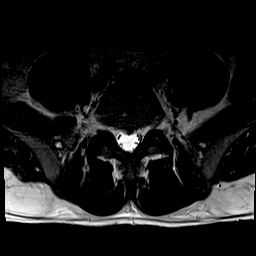
[im 10/31]
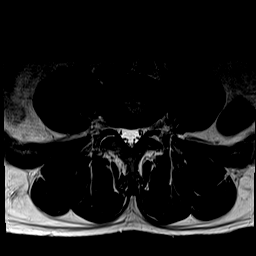
[im 14/31]
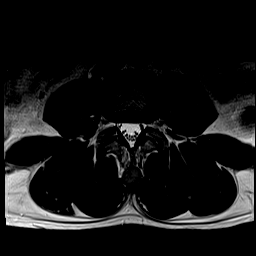
[im 17/31]
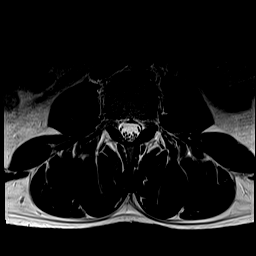
[im 21/31]
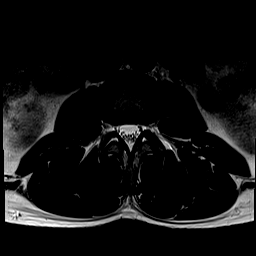
[im 26/31]
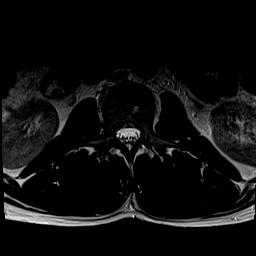
[im 31/31]
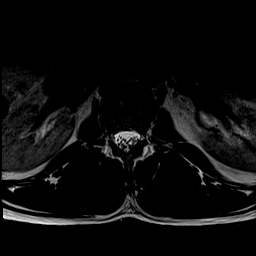

[Series 9: T1 · axial · 4.0mm · 0.39mm/px · z∈[-99,+96]mm · 8 of 31 slices shown (2 of 2)]
[im 1/31]
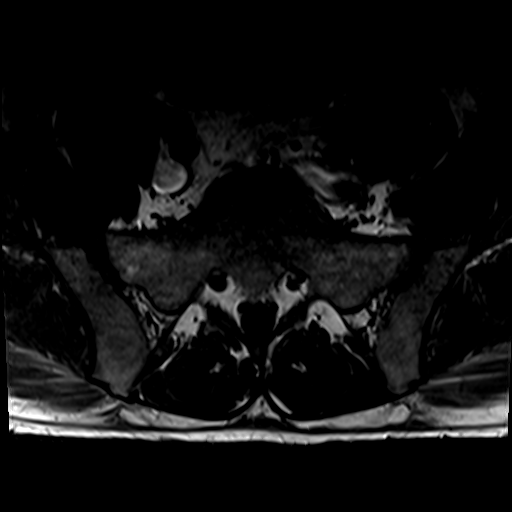
[im 5/31]
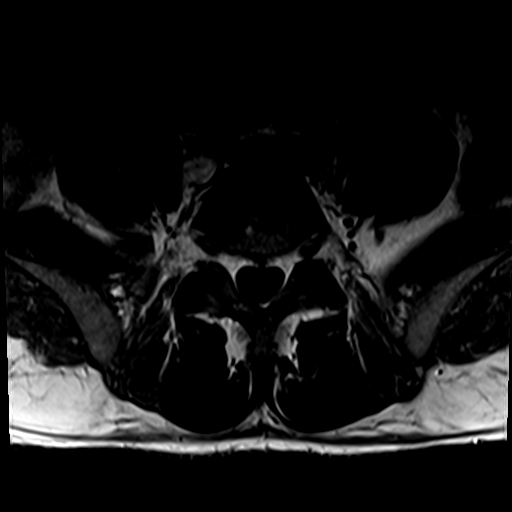
[im 10/31]
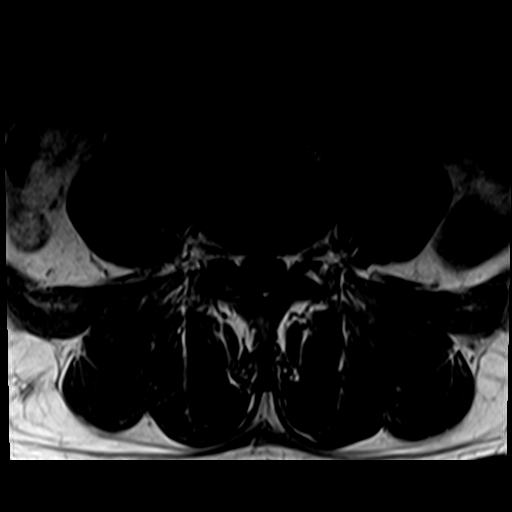
[im 14/31]
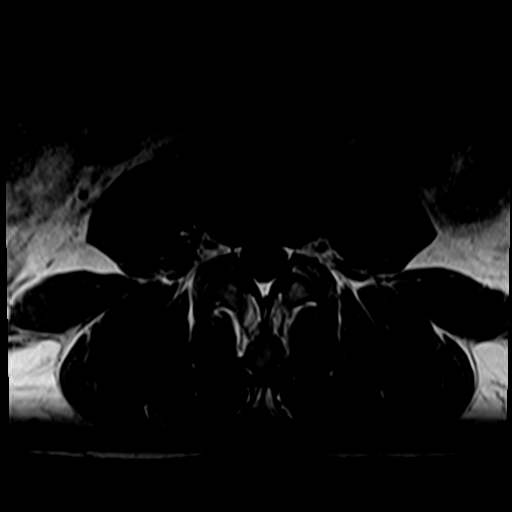
[im 17/31]
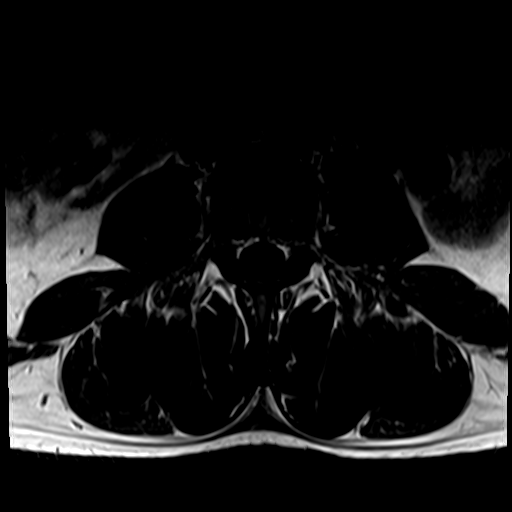
[im 21/31]
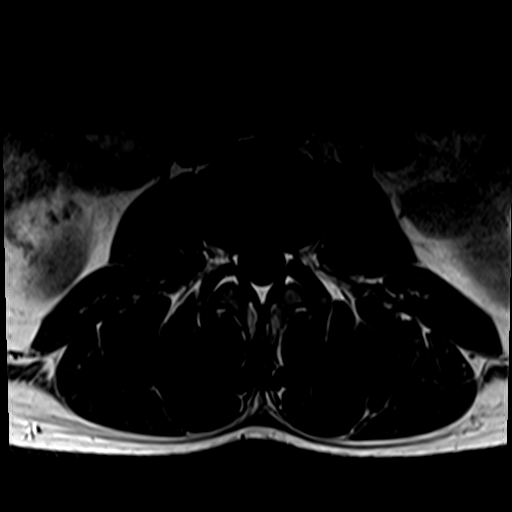
[im 26/31]
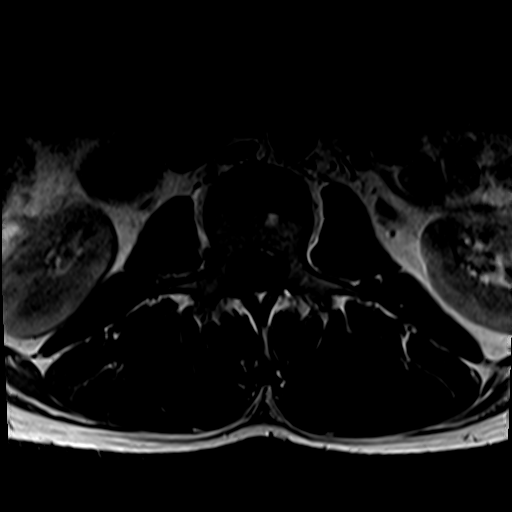
[im 31/31]
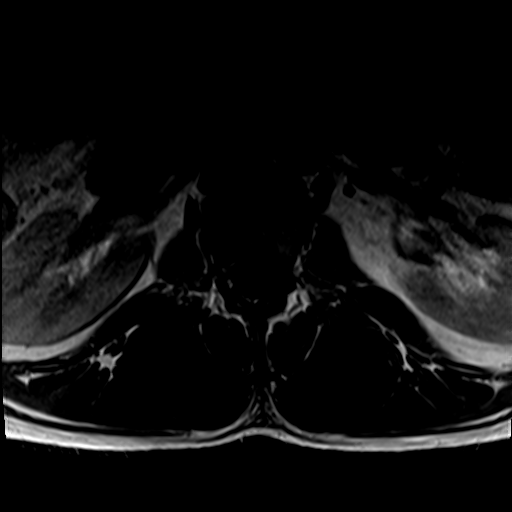

[31 of 48 positions shown; findings below may reference images not displayed]

FINDINGS: Segmentation:  Standard.

Alignment:  Physiologic.

Vertebrae:  No fracture, evidence of discitis, or bone lesion.

Conus medullaris and cauda equina: Conus extends to the L1 level.
Conus and cauda equina appear normal.

Paraspinal and other soft tissues: Negative.

Disc levels:

T12-L1 through L3-4: Unremarkable discs without focal disc
protrusion. Facet joints within normal limits. No foraminal or canal
stenosis. Unchanged.

L4-5: Minimal broad-based disc bulge with small left foraminal
protrusion. Minimal bilateral facet arthropathy. No foraminal or
canal stenosis. Slight interval progression of noncompressive disc
bulge.

L5-S1: Minimal broad-based disc bulge without focal protrusion.
Minimal bilateral facet arthropathy. No foraminal or canal stenosis.
Unchanged.
IMPRESSION: 1. Early mild lower lumbar spondylosis. Slight interval progression
of noncompressive disc bulge at L4-5 without nerve root impingement.
2. No foraminal or canal stenosis at any level.

## 2020-02-08 ENCOUNTER — Other Ambulatory Visit: Payer: Self-pay | Admitting: Internal Medicine

## 2020-02-08 DIAGNOSIS — M5126 Other intervertebral disc displacement, lumbar region: Secondary | ICD-10-CM

## 2020-02-08 DIAGNOSIS — M5136 Other intervertebral disc degeneration, lumbar region: Secondary | ICD-10-CM

## 2020-02-08 DIAGNOSIS — M545 Low back pain, unspecified: Secondary | ICD-10-CM

## 2020-02-10 ENCOUNTER — Telehealth: Payer: Self-pay | Admitting: Internal Medicine

## 2020-02-10 NOTE — Telephone Encounter (Signed)
Copied from [place] [phone]. Topic: General - Other >> Feb 10, 2020  1:28 PM Keene Breath wrote: Reason for CRM: Patient would like the nurse to call him to discuss his MRI and the pain that he is having.  Please call patient ot discuss at [phone]

## 2020-02-10 NOTE — Telephone Encounter (Signed)
Returned pt calls and went over MRI results. Pt states he is aware. Pt is requesting a refill on Tramadol. Pt states that is the only thing that helped his pain

## 2020-02-11 ENCOUNTER — Encounter: Payer: Self-pay | Admitting: Internal Medicine

## 2020-02-11 MED ORDER — MELOXICAM 15 MG PO TABS: 15.0000 mg | ORAL_TABLET | Freq: Every day | ORAL | 1 refills | Status: DC

## 2020-02-11 NOTE — Addendum Note (Signed)
Addended by: Karle Plumber B on: 02/11/2020 02:00 PM   Modules accepted: Orders

## 2020-02-11 NOTE — Telephone Encounter (Signed)
Message review.  Pt send Mychart message.

## 2020-02-25 ENCOUNTER — Encounter: Payer: Self-pay | Admitting: Internal Medicine

## 2020-02-25 NOTE — Progress Notes (Signed)
Received note from the neurosurgeon Dr. Christella Noa.  Patient was seen 02/16/2020.  He reports that the MRI is essentially normal.  There is no compression of neural element.  He has some mild degenerative changes in the joint at L4-5.  He states that there is no neurosurgical issue identified.  Patient continues to be treated with conservative means possibly injections.

## 2020-03-15 ENCOUNTER — Telehealth: Payer: Self-pay | Admitting: Internal Medicine

## 2020-03-15 DIAGNOSIS — M545 Low back pain, unspecified: Secondary | ICD-10-CM

## 2020-03-15 DIAGNOSIS — M5136 Other intervertebral disc degeneration, lumbar region: Secondary | ICD-10-CM

## 2020-03-15 DIAGNOSIS — M5126 Other intervertebral disc displacement, lumbar region: Secondary | ICD-10-CM

## 2020-03-15 NOTE — Telephone Encounter (Signed)
Contacted pt and made aware

## 2020-03-15 NOTE — Telephone Encounter (Signed)
Copied from [place] [id number]. Topic: Referral - Request for Referral >> Mar 15, 2020  8:37 AM Lennox Solders wrote: Has patient seen PCP for this complaint? Yes low back pain .Pt has [employer] med[employer]. Pt would like a referral to back specialist. Pt does have an appt with dr Wynetta Emery in April 2022

## 2020-03-15 NOTE — Telephone Encounter (Signed)
Will forward to provider  

## 2020-03-19 ENCOUNTER — Other Ambulatory Visit: Payer: Self-pay

## 2020-03-19 ENCOUNTER — Encounter: Payer: Self-pay | Admitting: Physical Medicine and Rehabilitation

## 2020-03-19 ENCOUNTER — Other Ambulatory Visit: Payer: Self-pay | Admitting: Internal Medicine

## 2020-03-19 ENCOUNTER — Encounter: Payer: Self-pay | Admitting: Otolaryngology

## 2020-03-19 DIAGNOSIS — F339 Major depressive disorder, recurrent, unspecified: Secondary | ICD-10-CM

## 2020-03-19 DIAGNOSIS — M545 Low back pain, unspecified: Secondary | ICD-10-CM

## 2020-03-19 NOTE — Telephone Encounter (Signed)
Requested medication (s) are due for refill today: Yes  Requested medication (s) are on the active medication list: Yes  Last refill:  02/02/20  Future visit scheduled: Yes  Notes to clinic:  Unable to refill per protocol, cannot delegate.      Requested Prescriptions  Pending Prescriptions Disp Refills   cyclobenzaprine (FLEXERIL) 5 MG tablet [Pharmacy Med Name: CYCLOBENZAPRINE 5 MG TABLET] 30 tablet 0    Sig: TAKE 1 TABLET BY MOUTH 2 TIMES DAILY AS NEEDED FOR MUSCLE SPASMS.      Not Delegated - Analgesics:  Muscle Relaxants Failed - 03/19/2020  3:38 PM      Failed - This refill cannot be delegated      Passed - Valid encounter within last 6 months    Recent Outpatient Visits           2 months ago Acute right-sided low back pain without sciatica   [place] Karle Plumber B, MD   4 months ago Acute right-sided low back pain without sciatica   [place], MD   5 months ago DM type 1 with diabetic peripheral neuropathy [facility])   [place], Deborah B, MD   7 months ago Uncontrolled type 1 diabetes mellitus with hyperglycemia [facility])   [place], [place], [place]   8 months ago Uncontrolled type 1 diabetes mellitus with hyperglycemia [facility])   [place], [place], [place]       Future Appointments             In 1 month Wynetta Emery Dalbert Batman, MD [place]   In 1 month Raulkar, Clide Deutscher, MD [facility], [place]              Signed Prescriptions Disp Refills   meloxicam (MOBIC) 15 MG tablet 30 tablet 1    Sig: TAKE 1 TABLET (15 MG TOTAL) BY MOUTH DAILY.      Analgesics:  COX2 Inhibitors Passed - 03/19/2020  3:38 PM      Passed - HGB in normal range and within 360 days    Hemoglobin  Date Value Ref Range Status   11/20/2019 16.1 13.0 - 17.7 g/dL Final          Passed - Cr in normal range and within 360 days    Creatinine, Ser  Date Value Ref Range Status  11/20/2019 0.99 0.76 - 1.27 mg/dL Final          Passed - Patient is not pregnant      Passed - Valid encounter within last 12 months    Recent Outpatient Visits           2 months ago Acute right-sided low back pain without sciatica   [place] Karle Plumber B, MD   4 months ago Acute right-sided low back pain without sciatica   [place] Karle Plumber B, MD   5 months ago DM type 1 with diabetic peripheral neuropathy [facility])   [place], MD   7 months ago Uncontrolled type 1 diabetes mellitus with hyperglycemia [facility])   [place], [place], [place]   8 months ago Uncontrolled type 1 diabetes mellitus with hyperglycemia ([place])   [place], [place], [place]       Future Appointments             In 1 month Wynetta Emery, Dalbert Batman, MD [place]   In 1 month Raulkar, Clide Deutscher, MD [facility], [place]              Refused Prescriptions Disp Refills   FLUoxetine (PROZAC) 10 MG capsule [Pharmacy Med Name: FLUOXETINE HCL 10 MG CAPSULE] 90 capsule 1    Sig: TAKE 1 [place]      Psychiatry:  Antidepressants - SSRI Passed - 03/19/2020  3:38 PM      Passed - Completed PHQ-2 or PHQ-9 in the last 360 days      Passed - Valid encounter within last 6 months    Recent Outpatient Visits           2 months ago Acute right-sided low back pain without sciatica   [place] Karle Plumber B, MD   4 months ago Acute right-sided low back pain without sciatica   [place], MD   5 months ago  DM type 1 with diabetic peripheral neuropathy [facility])   [place], Deborah B, MD   7 months ago Uncontrolled type 1 diabetes mellitus with hyperglycemia [facility])   [place], [place], [place]   8 months ago Uncontrolled type 1 diabetes mellitus with hyperglycemia [facility])   [place], [place], [place]       Future Appointments             In 1 month Wynetta Emery, Dalbert Batman, MD [place]   In 1 month Raulkar, Clide Deutscher, MD [facility], [place]

## 2020-03-22 ENCOUNTER — Other Ambulatory Visit
Admission: RE | Admit: 2020-03-22 | Discharge: 2020-03-22 | Disposition: A | Discharge status: Home / Self Care | Payer: Medicaid Other | Source: Ambulatory Visit | Attending: Otolaryngology | Admitting: Otolaryngology

## 2020-03-22 ENCOUNTER — Other Ambulatory Visit: Payer: Self-pay

## 2020-03-22 DIAGNOSIS — Z20822 Contact with and (suspected) exposure to covid-19: Secondary | ICD-10-CM | POA: Insufficient documentation

## 2020-03-22 DIAGNOSIS — Z01812 Encounter for preprocedural laboratory examination: Secondary | ICD-10-CM | POA: Insufficient documentation

## 2020-03-23 LAB — SARS CORONAVIRUS 2 (TAT 6-24 HRS): SARS Coronavirus 2: NEGATIVE

## 2020-03-23 NOTE — Discharge Instructions (Signed)

## 2020-03-24 ENCOUNTER — Ambulatory Visit: Payer: Medicaid Other | Admitting: Anesthesiology

## 2020-03-24 ENCOUNTER — Other Ambulatory Visit: Payer: Self-pay

## 2020-03-24 ENCOUNTER — Encounter
Admission: RE | Disposition: A | Discharge status: Home / Self Care | Payer: Self-pay | Source: Home / Self Care | Attending: Otolaryngology

## 2020-03-24 ENCOUNTER — Ambulatory Visit
Admission: RE | Admit: 2020-03-24 | Discharge: 2020-03-24 | Disposition: A | Discharge status: Home / Self Care | Payer: Medicaid Other | Attending: Otolaryngology | Admitting: Otolaryngology

## 2020-03-24 ENCOUNTER — Encounter: Payer: Self-pay | Admitting: Otolaryngology

## 2020-03-24 DIAGNOSIS — Z794 Long term (current) use of insulin: Secondary | ICD-10-CM | POA: Diagnosis not present

## 2020-03-24 DIAGNOSIS — Q181 Preauricular sinus and cyst: Secondary | ICD-10-CM | POA: Diagnosis present

## 2020-03-24 DIAGNOSIS — Z79890 Hormone replacement therapy (postmenopausal): Secondary | ICD-10-CM | POA: Insufficient documentation

## 2020-03-24 DIAGNOSIS — L720 Epidermal cyst: Secondary | ICD-10-CM | POA: Insufficient documentation

## 2020-03-24 DIAGNOSIS — F17200 Nicotine dependence, unspecified, uncomplicated: Secondary | ICD-10-CM | POA: Insufficient documentation

## 2020-03-24 DIAGNOSIS — Z79899 Other long term (current) drug therapy: Secondary | ICD-10-CM | POA: Insufficient documentation

## 2020-03-24 DIAGNOSIS — Z9641 Presence of insulin pump (external) (internal): Secondary | ICD-10-CM | POA: Insufficient documentation

## 2020-03-24 HISTORY — PX: EAR CYST EXCISION: SHX22

## 2020-03-24 LAB — GLUCOSE, CAPILLARY
Glucose-Capillary: 116 mg/dL — ABNORMAL HIGH (ref 70–99)
Glucose-Capillary: 90 mg/dL (ref 70–99)

## 2020-03-24 SURGERY — EXCISION, CYST, EAR
Anesthesia: General | Site: Ear | Laterality: Right

## 2020-03-24 MED ORDER — ACETAMINOPHEN 160 MG/5ML PO SOLN: 325.0000 mg | ORAL | Status: DC | PRN

## 2020-03-24 MED ORDER — GLYCOPYRROLATE 0.2 MG/ML IJ SOLN
INTRAMUSCULAR | Status: DC | PRN
  Administered 2020-03-24: .1 mg via INTRAVENOUS

## 2020-03-24 MED ORDER — PROPOFOL 10 MG/ML IV BOLUS
INTRAVENOUS | Status: DC | PRN
  Administered 2020-03-24: 150 mg via INTRAVENOUS

## 2020-03-24 MED ORDER — DOXYCYCLINE HYCLATE 100 MG PO CAPS: 100.0000 mg | ORAL_CAPSULE | Freq: Two times a day (BID) | ORAL | 0 refills | Status: DC

## 2020-03-24 MED ORDER — ACETAMINOPHEN 325 MG PO TABS: 325.0000 mg | ORAL_TABLET | ORAL | Status: DC | PRN

## 2020-03-24 MED ORDER — DEXAMETHASONE SODIUM PHOSPHATE 4 MG/ML IJ SOLN
INTRAMUSCULAR | Status: DC | PRN
  Administered 2020-03-24: 8 mg via INTRAVENOUS

## 2020-03-24 MED ORDER — ONDANSETRON HCL 4 MG PO TABS: 4.0000 mg | ORAL_TABLET | Freq: Three times a day (TID) | ORAL | 0 refills | Status: DC | PRN

## 2020-03-24 MED ORDER — MIDAZOLAM HCL 5 MG/5ML IJ SOLN
INTRAMUSCULAR | Status: DC | PRN
  Administered 2020-03-24: 2 mg via INTRAVENOUS

## 2020-03-24 MED ORDER — LIDOCAINE-EPINEPHRINE 1 %-1:100000 IJ SOLN
INTRAMUSCULAR | Status: DC | PRN
  Administered 2020-03-24: 1 mL

## 2020-03-24 MED ORDER — LIDOCAINE HCL (CARDIAC) PF 100 MG/5ML IV SOSY
PREFILLED_SYRINGE | INTRAVENOUS | Status: DC | PRN
  Administered 2020-03-24: 50 mg via INTRATRACHEAL

## 2020-03-24 MED ORDER — FENTANYL CITRATE (PF) 100 MCG/2ML IJ SOLN
INTRAMUSCULAR | Status: DC | PRN
  Administered 2020-03-24: 50 ug via INTRAVENOUS

## 2020-03-24 MED ORDER — ONDANSETRON HCL 4 MG/2ML IJ SOLN: 4.0000 mg | Freq: Once | INTRAMUSCULAR | Status: DC | PRN

## 2020-03-24 MED ORDER — BACITRACIN 500 UNIT/GM EX OINT
TOPICAL_OINTMENT | CUTANEOUS | Status: DC | PRN
  Administered 2020-03-24: 1 via TOPICAL

## 2020-03-24 MED ORDER — EPHEDRINE SULFATE 50 MG/ML IJ SOLN
INTRAMUSCULAR | Status: DC | PRN
  Administered 2020-03-24 (×3): 5 mg via INTRAVENOUS

## 2020-03-24 MED ORDER — OXYCODONE HCL 5 MG/5ML PO SOLN: 5.0000 mg | Freq: Once | ORAL | Status: DC | PRN

## 2020-03-24 MED ORDER — LACTATED RINGERS IV SOLN: INTRAVENOUS | Status: DC

## 2020-03-24 MED ORDER — FENTANYL CITRATE (PF) 100 MCG/2ML IJ SOLN: 25.0000 ug | INTRAMUSCULAR | Status: DC | PRN

## 2020-03-24 MED ORDER — HYDROCODONE-ACETAMINOPHEN 5-325 MG PO TABS: 1.0000 | ORAL_TABLET | ORAL | 0 refills | Status: DC | PRN

## 2020-03-24 MED ORDER — ONDANSETRON HCL 4 MG/2ML IJ SOLN
INTRAMUSCULAR | Status: DC | PRN
  Administered 2020-03-24: 4 mg via INTRAVENOUS

## 2020-03-24 MED ORDER — OXYCODONE HCL 5 MG PO TABS: 5.0000 mg | ORAL_TABLET | Freq: Once | ORAL | Status: DC | PRN

## 2020-03-24 SURGICAL SUPPLY — 18 items
APPLICATOR COTTON TIP WD 3 STR (MISCELLANEOUS) ×2 IMPLANT
BLADE SURG 15 STRL LF DISP TIS (BLADE) ×1 IMPLANT
BLADE SURG 15 STRL SS (BLADE) ×2
CORD BIP STRL DISP 12FT (MISCELLANEOUS) ×2 IMPLANT
GLOVE PI ULTRA LF STRL 7.5 (GLOVE) ×2 IMPLANT
GLOVE PI ULTRA NON LATEX 7.5 (GLOVE) ×2
GOWN STRL REUS W/ TWL LRG LVL3 (GOWN DISPOSABLE) ×1 IMPLANT
GOWN STRL REUS W/TWL LRG LVL3 (GOWN DISPOSABLE) ×2
KIT TURNOVER KIT A (KITS) ×2 IMPLANT
NS IRRIG 500ML POUR BTL (IV SOLUTION) ×2 IMPLANT
PACK ENT CUSTOM (PACKS) ×2 IMPLANT
SOL PREP PVP 2OZ (MISCELLANEOUS) ×2
SOLUTION PREP PVP 2OZ (MISCELLANEOUS) ×1 IMPLANT
STRAP BODY AND KNEE 60X3 (MISCELLANEOUS) ×2 IMPLANT
SUT PROLENE 5 0 P 3 (SUTURE) ×2 IMPLANT
SUT PROLENE 6 0 P 1 18 (SUTURE) ×2 IMPLANT
SUT VIC AB 5-0 PC1 18 (SUTURE) ×2 IMPLANT
SYR EAR/ULCER 2OZ (SYRINGE) ×2 IMPLANT

## 2020-03-24 NOTE — Op Note (Signed)
..  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 The patient was marked in the normal fashion on the right ear. The patient was next taken to the operating room and placed in a supine position. General laryngeal mask anesthesia was induced in the normal fashion.  145mof 1% lidocaine with 1:100,000 epinephrine was injected into the surrounding areas of the right pre-auricular pit/fistula.  At this time the patient was prepped and draped in a normal fashion.  A lacrimal probe was placed within the tract.  An elliptical skin incision was made around the fistula pit.  A babcock clamp was placed on the skin with the lacrimal probe within the pit and sinus tract.  A 15 blade scalpel and scissors was used to excise the entire tract and sinus.  This extended to the preauricular cartilage and this was excised with scissors.  The wound was copiously irrigated.  The wound was closed with subcutaneous vicryl 5.0 and skin closed with running 6.0 prolene and topped with bacitracin.   Dispo:   To PACU in Good condition.  Plan:  To follow up in one week for suture removal.  Keep area dry for 48 hours.  Rashad Obeid  03/24/2020 9:26 AM  \

## 2020-03-24 NOTE — Anesthesia Postprocedure Evaluation (Signed)
Anesthesia Post Note  Patient: Ronald Banks  Procedure(s) Performed: EXCISION PRE-AURICULAR CYST (Right Ear)     Patient location during evaluation: PACU Anesthesia Type: General Level of consciousness: awake and alert and oriented Pain management: satisfactory to patient Vital Signs Assessment: post-procedure vital signs reviewed and stable Respiratory status: spontaneous breathing, nonlabored ventilation and respiratory function stable Cardiovascular status: blood pressure returned to baseline and stable Postop Assessment: Adequate PO intake and No signs of nausea or vomiting Anesthetic complications: no   No complications documented.  Raliegh Ip

## 2020-03-24 NOTE — H&P (Signed)
..  History and Physical paper copy reviewed and updated date of procedure and will be scanned into system.  Patient seen and examined and marked.

## 2020-03-24 NOTE — Anesthesia Procedure Notes (Signed)
Procedure Name: LMA Insertion Date/Time: 03/24/2020 8:43 AM Performed by: Mayme Genta, CRNA Pre-anesthesia Checklist: Patient identified, Emergency Drugs available, Suction available, Timeout performed and Patient being monitored Patient Re-evaluated:Patient Re-evaluated prior to induction Oxygen Delivery Method: Circle system utilized Preoxygenation: Pre-oxygenation with 100% oxygen Induction Type: IV induction LMA: LMA inserted LMA Size: 4.0 Number of attempts: 1 Placement Confirmation: positive ETCO2 and breath sounds checked- equal and bilateral Tube secured with: Tape

## 2020-03-24 NOTE — Anesthesia Preprocedure Evaluation (Signed)
Anesthesia Evaluation  Patient identified by MRN, date of birth, ID band Patient awake    Reviewed: Allergy & Precautions, H&P , NPO status , Patient's Chart, lab work & pertinent test results  Airway Mallampati: II  TM Distance: >3 FB Neck ROM: full    Dental no notable dental hx.    Pulmonary Current SmokerPatient did not abstain from smoking.,    Pulmonary exam normal breath sounds clear to auscultation       Cardiovascular hypertension, Normal cardiovascular exam Rhythm:regular Rate:Normal     Neuro/Psych PSYCHIATRIC DISORDERS    GI/Hepatic   Endo/Other  diabetes, Well Controlled, Type 1, Insulin DependentHypothyroidism   Renal/GU      Musculoskeletal   Abdominal   Peds  Hematology   Anesthesia Other Findings   Reproductive/Obstetrics                             Anesthesia Physical Anesthesia Plan  ASA: III  Anesthesia Plan: General   Post-op Pain Management:    Induction:   PONV Risk Score and Plan: 2 and Treatment may vary due to age or medical condition, Ondansetron and Dexamethasone  Airway Management Planned:   Additional Equipment:   Intra-op Plan:   Post-operative Plan:   Informed Consent: I have reviewed the patients History and Physical, chart, labs and discussed the procedure including the risks, benefits and alternatives for the proposed anesthesia with the patient or authorized representative who has indicated his/her understanding and acceptance.     Dental Advisory Given  Plan Discussed with: CRNA  Anesthesia Plan Comments:         Anesthesia Quick Evaluation

## 2020-03-24 NOTE — Transfer of Care (Signed)
Immediate Anesthesia Transfer of Care Note  Patient: Ronald Banks  Procedure(s) Performed: EXCISION PRE-AURICULAR CYST (Right Ear)  Patient Location: PACU  Anesthesia Type: General  Level of Consciousness: awake, alert  and patient cooperative  Airway and Oxygen Therapy: Patient Spontanous Breathing and Patient connected to supplemental oxygen  Post-op Assessment: Post-op Vital signs reviewed, Patient's Cardiovascular Status Stable, Respiratory Function Stable, Patent Airway and No signs of Nausea or vomiting  Post-op Vital Signs: Reviewed and stable  Complications: No complications documented.

## 2020-03-25 ENCOUNTER — Encounter: Payer: Self-pay | Admitting: Otolaryngology

## 2020-03-25 LAB — SURGICAL PATHOLOGY

## 2020-05-04 ENCOUNTER — Encounter: Payer: Self-pay | Admitting: Internal Medicine

## 2020-05-04 ENCOUNTER — Ambulatory Visit: Payer: Medicaid Other | Attending: Internal Medicine | Admitting: Internal Medicine

## 2020-05-04 ENCOUNTER — Other Ambulatory Visit: Payer: Self-pay

## 2020-05-04 VITALS — BP 117/69 | HR 72 | Resp 16 | Wt 195.8 lb

## 2020-05-04 DIAGNOSIS — E1042 Type 1 diabetes mellitus with diabetic polyneuropathy: Secondary | ICD-10-CM

## 2020-05-04 DIAGNOSIS — E1069 Type 1 diabetes mellitus with other specified complication: Secondary | ICD-10-CM

## 2020-05-04 DIAGNOSIS — Z1211 Encounter for screening for malignant neoplasm of colon: Secondary | ICD-10-CM

## 2020-05-04 DIAGNOSIS — F321 Major depressive disorder, single episode, moderate: Secondary | ICD-10-CM

## 2020-05-04 DIAGNOSIS — M545 Low back pain, unspecified: Secondary | ICD-10-CM | POA: Diagnosis not present

## 2020-05-04 DIAGNOSIS — E785 Hyperlipidemia, unspecified: Secondary | ICD-10-CM

## 2020-05-04 DIAGNOSIS — F17200 Nicotine dependence, unspecified, uncomplicated: Secondary | ICD-10-CM

## 2020-05-04 DIAGNOSIS — R413 Other amnesia: Secondary | ICD-10-CM

## 2020-05-04 DIAGNOSIS — G8929 Other chronic pain: Secondary | ICD-10-CM

## 2020-05-04 DIAGNOSIS — F32A Depression, unspecified: Secondary | ICD-10-CM

## 2020-05-04 MED ORDER — GABAPENTIN 600 MG PO TABS: 600.0000 mg | ORAL_TABLET | Freq: Three times a day (TID) | ORAL | 3 refills | Status: DC
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
## 2020-05-04 NOTE — Patient Instructions (Signed)
We have increased the gabapentin to 600 mg 3 times a day to see if it will help decrease some of the neuropathy symptoms in your legs and feet.  I have referred you to [facility] behavioral health for the depression.  You have an upcoming appointment later this month with the pain specialist.  Please keep that appointment.  I have referred you to the neurologist for the memory changes.

## 2020-05-10 ENCOUNTER — Other Ambulatory Visit: Payer: Self-pay | Admitting: Internal Medicine

## 2020-05-10 DIAGNOSIS — F17200 Nicotine dependence, unspecified, uncomplicated: Secondary | ICD-10-CM

## 2020-05-10 DIAGNOSIS — F339 Major depressive disorder, recurrent, unspecified: Secondary | ICD-10-CM

## 2020-05-10 NOTE — Telephone Encounter (Signed)
}    Notes to clinic:  medication that has been requested is not on current medication list  Shows d/c on 11/20/2019 Last refilled on 02/02/2020  Requested Prescriptions  Pending Prescriptions Disp Refills   buPROPion (WELLBUTRIN SR) 150 MG 12 hr tablet [Pharmacy Med Name: BUPROPION HCL SR 150 MG TABLET] 180 tablet 1    Sig: TAKE 1 TABLET DAILY FOR 1 WEEK, THEN 1 INCREASE TO 1 TABLET TWICE A DAY      Psychiatry: Antidepressants - bupropion Passed - 05/10/2020  2:07 PM      Passed - Completed PHQ-2 or PHQ-9 in the last 360 days      Passed - Last BP in normal range    BP Readings from Last 1 Encounters:  05/04/20 117/69          Passed - Valid encounter within last 6 months    Recent Outpatient Visits           6 days ago Chronic midline low back pain without sciatica   [place], Deborah B, MD   4 months ago Acute right-sided low back pain without sciatica   [place] Karle Plumber B, MD   5 months ago Acute right-sided low back pain without sciatica   [place], Deborah B, MD   7 months ago DM type 1 with diabetic peripheral neuropathy [facility])   [place], MD   9 months ago Uncontrolled type 1 diabetes mellitus with hyperglycemia [facility])   [place], Dionne Bu[place], [place]       Future Appointments             In 2 days [place], Clide Deutscher, MD [facility]   In 3 months Ladell Pier, MD [place]

## 2020-05-12 ENCOUNTER — Other Ambulatory Visit: Payer: Self-pay

## 2020-05-12 ENCOUNTER — Encounter: Payer: Self-pay | Admitting: Physical Medicine and Rehabilitation

## 2020-05-12 ENCOUNTER — Encounter
Payer: Medicaid Other | Attending: Physical Medicine and Rehabilitation | Admitting: Physical Medicine and Rehabilitation

## 2020-05-12 VITALS — BP 119/73 | HR 68 | Temp 98.2°F | Ht 70.0 in | Wt 201.2 lb

## 2020-05-12 DIAGNOSIS — M5126 Other intervertebral disc displacement, lumbar region: Secondary | ICD-10-CM | POA: Insufficient documentation

## 2020-05-12 DIAGNOSIS — M5136 Other intervertebral disc degeneration, lumbar region: Secondary | ICD-10-CM

## 2020-05-12 MED ORDER — MELOXICAM 15 MG PO TBDP: 1.0000 | ORAL_TABLET | Freq: Every day | ORAL | 0 refills | Status: DC

## 2020-05-12 MED ORDER — LIDOCAINE 5 % EX PTCH: 1.0000 | MEDICATED_PATCH | CUTANEOUS | 0 refills | Status: DC

## 2020-05-12 NOTE — Progress Notes (Signed)
Subjective:    Patient ID: Ronald Banks, male    DOB: 11/09/1975, 45 y.o.   MRN: [id number]  HPI  Ronald Banks is a 45 year old man who presents to establish care for low back pain that has present for 2 years. It started when he was lifting heavy wood for work (he is a Development worker, international aid) -muscle rub did not help -flexeril helps minimally (takes the edge off) -tylenol does not help -has no history of kidney disease -average pain is 7/10 -pain right now is 8/10 -pain is sharp, dull, aching -worse in his right lower back -worse with walking.  -starts in the beginning of the day and lasts throughout.   Pain Inventory Average Pain 7 Pain Right Now 8 My pain is sharp, dull and aching  In the last 24 hours, has pain interfered with the following? General activity 6 Relation with others 0 Enjoyment of life 7 What TIME of day is your pain at its worst? morning , daytime, evening and night Sleep (in general) Poor  Pain is worse with: walking, bending and sitting Pain improves with: no ans Relief from Meds: no med  walk without assistance how many minutes can you walk? 20 ability to climb steps?  yes do you drive?  yes  not employed: date last employed 04/29/20  weakness confusion depression  Any changes since last visit?  no  Primary care Karle Plumber    Family History  Problem Relation Age of Onset  . Diabetes Mother   . Cancer Mother        Brain, breast   . Diabetes Sister   . Diabetes Maternal Grandmother    Social History   Socioeconomic History  . Marital status: Single    Spouse name: Not on file  . Number of children: Not on file  . Years of education: Not on file  . Highest education level: Not on file  Occupational History  . Not on file  Tobacco Use  . Smoking status: Current Every Day Smoker    Packs/day: 1.00    Years: 30.00    Pack years: 30.00    Types: Cigarettes  . Smokeless tobacco: Never Used  Vaping Use  . Vaping Use: Never used   Substance and Sexual Activity  . Alcohol use: Not Currently  . Drug use: Yes    Types: Marijuana    Comment: Several times per week  . Sexual activity: Yes  Other Topics Concern  . Not on file  Social History Narrative   Right handed    Caffeine 2 cups per day    Lives at home with his mom    Social Determinants of Health   Financial Resource Strain: Not on file  Food Insecurity: Not on file  Transportation Needs: Not on file  Physical Activity: Not on file  Stress: Not on file  Social Connections: Not on file   Past Surgical History:  Procedure Laterality Date  . DENTAL SURGERY    . EAR CYST EXCISION Right 03/24/2020   Procedure: EXCISION PRE-AURICULAR CYST;  Surgeon: Ronald Manner, MD;  Location: [facility];  Service: ENT;  Laterality: Right;  diabetic insulin pump  . NO PAST SURGERIES     Past Medical History:  Diagnosis Date  . Diabetes mellitus without complication ([place])    type 1   . Hyperlipidemia   . Hypertensive retinopathy of both eyes 05/19/2019   [facility] Exam done 05/13/2019  . Moderate nonproliferative diabetic retinopathy  of both eyes ([place]) 05/19/2019  . Thyroid disease    BP 119/73   Pulse 68   Temp 98.2 F (36.8 C)   Ht 5' 10"  (1.778 m)   Wt 201 lb 3.2 oz (91.3 kg)   SpO2 93%   BMI 28.87 kg/m   Opioid Risk Score:   Fall Risk Score:  `1  Depression screen PHQ 2/9  Depression screen [facility] 2/9 05/12/2020 05/04/2020 11/20/2019 10/09/2019 07/30/2019 06/26/2019 02/12/2019  Decreased Interest 2 3 3 3 3 1 3   Down, Depressed, Hopeless 3 3 3  - 3 1 3   PHQ - 2 Score 5 6 6 3 6 2 6   Altered sleeping 3 2 2 3 2  0 2  Tired, decreased energy 2 3 3 2 3 1 3   Change in appetite 2 1 2 1 2 2 3   Feeling bad or failure about yourself  2 2 1 1 1 3 3   Trouble concentrating 2 2 3 3 3  0 3  Moving slowly or fidgety/restless 2 1 0 0 1 0 3  Suicidal thoughts 0 1 0 0 1 0 2  PHQ-9 Score 18 18 17 13 19 8 25   Difficult doing work/chores - - - - - - Very difficult   Some recent data might be hidden    Review of Systems  Constitutional: Negative.   HENT: Negative.   Eyes: Negative.   Respiratory: Negative.   Cardiovascular: Negative.   Gastrointestinal: Negative.   Endocrine: Negative.   Genitourinary: Negative.   Musculoskeletal: Positive for back pain.  Skin: Negative.   Allergic/Immunologic: Negative.   Neurological: Positive for weakness.  Hematological: Negative.   Psychiatric/Behavioral: Positive for confusion.  All other systems reviewed and are negative.      Objective:   Physical Exam Gen: no distress, normal appearing HEENT: oral mucosa pink and moist, NCAT Cardio: Reg rate Chest: normal effort, normal rate of breathing Abd: soft, non-distended Ext: no edema Psych: pleasant, normal affect Skin: intact Neuro: Alert and oriented x3.  Musculoskeletal: 5/5 strength throughout. Full lumbar flexion but reproduces low back pain. Extension is limited but makes main better. 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  -Discussed benefits of exercise in reducing pain. -Lumbar MRI reviewed with patient -continued home exercise program -start meloxicam 10m PRN daily. Discussed side effects and interaction with fluoxetine. Use sparingly to minimize tolerance and side effects -5% lidocaine patch prescribed -recommended blue emu oil -Discussed following foods that may reduce pain: 1) Ginger (especially studied for arthritis)- reduce leukotriene production to decrease inflammation 2) Blueberries- high in phytonutrients that decrease inflammation 3) Salmon- marine omega-3s reduce joint swelling and pain 4) Pumpkin seeds- reduce inflammation 5) dark chocolate- reduces inflammation 6) turmeric- reduces inflammation 7) tart cherries - reduce pain and stiffness 8) extra virgin  olive oil - its compound olecanthal helps to block prostaglandins  9) chili peppers- can be eaten or applied topically via capsaicin 10) mint- helpful for headache, muscle aches, joint pain, and itching 11) garlic- reduces inflammation  Link to further information on diet for chronic pain: [email or web address]

## 2020-05-12 NOTE — Patient Instructions (Addendum)
Blue emu oil  -Discussed following foods that may reduce pain: 1) Ginger (especially studied for arthritis)- reduce leukotriene production to decrease inflammation 2) Blueberries- high in phytonutrients that decrease inflammation 3) Salmon- marine omega-3s reduce joint swelling and pain 4) Pumpkin seeds- reduce inflammation 5) dark chocolate- reduces inflammation 6) turmeric- reduces inflammation 7) tart cherries - reduce pain and stiffness 8) extra virgin olive oil - its compound olecanthal helps to block prostaglandins  9) chili peppers- can be eaten or applied topically via capsaicin 10) mint- helpful for headache, muscle aches, joint pain, and itching 11) garlic- reduces inflammation  Link to further information on diet for chronic pain: [email or web address]

## 2020-05-22 LAB — COLOGUARD: Cologuard: NEGATIVE

## 2020-05-25 ENCOUNTER — Other Ambulatory Visit: Payer: Self-pay

## 2020-05-25 ENCOUNTER — Telehealth: Payer: Self-pay

## 2020-05-25 NOTE — Telephone Encounter (Signed)
Contacted pt to go over Cologuard results pt is aware and doesn't have any questions or concerns

## 2020-05-28 ENCOUNTER — Other Ambulatory Visit: Payer: Self-pay | Admitting: Internal Medicine

## 2020-05-28 DIAGNOSIS — F339 Major depressive disorder, recurrent, unspecified: Secondary | ICD-10-CM

## 2020-05-28 DIAGNOSIS — F17200 Nicotine dependence, unspecified, uncomplicated: Secondary | ICD-10-CM

## 2020-06-02 ENCOUNTER — Other Ambulatory Visit: Payer: Self-pay | Admitting: Internal Medicine

## 2020-06-02 NOTE — Telephone Encounter (Signed)
Requested medications are due for refill today.  yes  Requested medications are on the active medications list.  yes  Last refill. 03/09/2020  Future visit scheduled.   yes  Notes to clinic.  Historical medication

## 2020-06-10 ENCOUNTER — Encounter: Payer: Self-pay | Admitting: Otolaryngology

## 2020-06-18 NOTE — Discharge Instructions (Signed)

## 2020-06-23 ENCOUNTER — Other Ambulatory Visit: Payer: Self-pay

## 2020-06-23 ENCOUNTER — Ambulatory Visit: Payer: Medicaid Other | Admitting: Anesthesiology

## 2020-06-23 ENCOUNTER — Encounter
Admission: RE | Disposition: A | Discharge status: Home / Self Care | Payer: Self-pay | Source: Home / Self Care | Attending: Otolaryngology

## 2020-06-23 ENCOUNTER — Encounter: Payer: Self-pay | Admitting: Otolaryngology

## 2020-06-23 ENCOUNTER — Ambulatory Visit
Admission: RE | Admit: 2020-06-23 | Discharge: 2020-06-23 | Disposition: A | Discharge status: Home / Self Care | Payer: Medicaid Other | Attending: Otolaryngology | Admitting: Otolaryngology

## 2020-06-23 DIAGNOSIS — Q181 Preauricular sinus and cyst: Secondary | ICD-10-CM | POA: Insufficient documentation

## 2020-06-23 DIAGNOSIS — Z5309 Procedure and treatment not carried out because of other contraindication: Secondary | ICD-10-CM | POA: Insufficient documentation

## 2020-06-23 HISTORY — DX: Hypothyroidism, unspecified: E03.9

## 2020-06-23 LAB — GLUCOSE, CAPILLARY
Glucose-Capillary: 236 mg/dL — ABNORMAL HIGH (ref 70–99)
Glucose-Capillary: 245 mg/dL — ABNORMAL HIGH (ref 70–99)

## 2020-06-23 SURGERY — EXCISION, CYST, EAR
Anesthesia: General | Laterality: Left

## 2020-06-23 SURGICAL SUPPLY — 31 items
APPLICATOR COTTON TIP 3IN (MISCELLANEOUS) ×2 IMPLANT
CORD BIP STRL DISP 12FT (MISCELLANEOUS) IMPLANT
DERMABOND ADVANCED (GAUZE/BANDAGES/DRESSINGS)
DERMABOND ADVANCED .7 DNX12 (GAUZE/BANDAGES/DRESSINGS) IMPLANT
DRSG TELFA 4X3 1S NADH ST (GAUZE/BANDAGES/DRESSINGS) IMPLANT
ELECT CAUTERY BLADE TIP 2.5 (TIP)
ELECT CAUTERY NEEDLE 2.0 MIC (NEEDLE) ×2 IMPLANT
ELECT REM PT RETURN 9FT ADLT (ELECTROSURGICAL) ×2
ELECTRODE CAUTERY BLDE TIP 2.5 (TIP) IMPLANT
ELECTRODE REM PT RTRN 9FT ADLT (ELECTROSURGICAL) ×1 IMPLANT
GAUZE SPONGE 4X4 12PLY STRL (GAUZE/BANDAGES/DRESSINGS) IMPLANT
GLOVE PI ULTRA LF STRL 7.5 (GLOVE) ×2 IMPLANT
GLOVE PI ULTRA NON LATEX 7.5 (GLOVE) ×2
GOWN STRL REUS W/ TWL LRG LVL3 (GOWN DISPOSABLE) ×1 IMPLANT
GOWN STRL REUS W/TWL LRG LVL3 (GOWN DISPOSABLE) ×1
KIT TURNOVER KIT A (KITS) ×2 IMPLANT
NEEDLE HYPO 25GX1X1/2 BEV (NEEDLE) ×2 IMPLANT
NS IRRIG 500ML POUR BTL (IV SOLUTION) ×2 IMPLANT
PACK ENT CUSTOM (PACKS) ×2 IMPLANT
PENCIL SMOKE EVACUATOR (MISCELLANEOUS) ×2 IMPLANT
SOL PREP PVP 2OZ (MISCELLANEOUS) ×2
SOLUTION PREP PVP 2OZ (MISCELLANEOUS) ×1 IMPLANT
SPONGE KITTNER 5P (MISCELLANEOUS) ×2 IMPLANT
STRAP BODY AND KNEE 60X3 (MISCELLANEOUS) ×2 IMPLANT
SUCTION FRAZIER HANDLE 10FR (MISCELLANEOUS)
SUCTION TUBE FRAZIER 10FR DISP (MISCELLANEOUS) IMPLANT
SUT PROLENE 5 0 P 3 (SUTURE) ×2 IMPLANT
SUT VIC AB 4-0 RB1 27 (SUTURE) ×1
SUT VIC AB 4-0 RB1 27X BRD (SUTURE) ×1 IMPLANT
SYR 10ML LL (SYRINGE) ×2 IMPLANT
SYR BULB 3OZ (MISCELLANEOUS) ×2 IMPLANT

## 2020-06-23 NOTE — Progress Notes (Signed)
Patient drank sprite at 845 and decision made by anesthesia and ENT to delay surgery to another date.

## 2020-06-24 ENCOUNTER — Encounter
Payer: Medicaid Other | Attending: Physical Medicine and Rehabilitation | Admitting: Physical Medicine and Rehabilitation

## 2020-06-24 DIAGNOSIS — M5126 Other intervertebral disc displacement, lumbar region: Secondary | ICD-10-CM | POA: Insufficient documentation

## 2020-06-25 ENCOUNTER — Other Ambulatory Visit: Payer: Self-pay | Admitting: Internal Medicine

## 2020-06-25 DIAGNOSIS — F339 Major depressive disorder, recurrent, unspecified: Secondary | ICD-10-CM

## 2020-06-25 DIAGNOSIS — F17200 Nicotine dependence, unspecified, uncomplicated: Secondary | ICD-10-CM

## 2020-06-25 NOTE — Telephone Encounter (Signed)
   Notes to clinic:  medication requested is not on current list Medication has been d/c  Requested Prescriptions  Pending Prescriptions Disp Refills   buPROPion (WELLBUTRIN SR) 150 MG 12 hr tablet [Pharmacy Med Name: BUPROPION HCL SR 150 MG TABLET] 180 tablet 1    Sig: TAKE 1 TABLET DAILY FOR 1 WEEK, THEN 1 INCREASE TO 1 TABLET TWICE A DAY      Psychiatry: Antidepressants - bupropion Failed - 06/25/2020 12:30 PM      Failed - Last BP in normal range    BP Readings from Last 1 Encounters:  06/23/20 140/62          Passed - Completed PHQ-2 or PHQ-9 in the last 360 days      Passed - Valid encounter within last 6 months    Recent Outpatient Visits           1 month ago Chronic midline low back pain without sciatica   [place], Deborah B, MD   5 months ago Acute right-sided low back pain without sciatica   [place], MD   7 months ago Acute right-sided low back pain without sciatica   [place], Deborah B, MD   8 months ago DM type 1 with diabetic peripheral neuropathy [facility])   [place], MD   11 months ago Uncontrolled type 1 diabetes mellitus with hyperglycemia [facility])   [place], Dionne Bucy, [place]       Future Appointments             In 2 months Wynetta Emery, Dalbert Batman, MD [place]

## 2020-06-28 ENCOUNTER — Other Ambulatory Visit: Payer: Self-pay

## 2020-06-28 ENCOUNTER — Encounter: Payer: Self-pay | Admitting: Otolaryngology

## 2020-06-30 ENCOUNTER — Encounter: Payer: Self-pay | Admitting: Neurology

## 2020-06-30 ENCOUNTER — Ambulatory Visit: Payer: Medicaid Other | Admitting: Neurology

## 2020-06-30 VITALS — BP 122/71 | HR 78 | Ht 70.0 in | Wt 194.0 lb

## 2020-06-30 DIAGNOSIS — R413 Other amnesia: Secondary | ICD-10-CM | POA: Diagnosis not present

## 2020-06-30 NOTE — Progress Notes (Signed)
Reason for visit: Memory disturbance  Referring physician: Dr. Lucia Gaskins Ronald Banks is a 45 y.o. male  History of present illness:  Ronald Banks is a 45 year old right-handed white male with a history of type 1 diabetes, diabetic peripheral neuropathy, carpal tunnel syndrome, hypertension, dyslipidemia, hypothyroidism, depression, and ongoing tobacco abuse with emphysema.  The patient is sent here with a 2 or 3-year history of memory issues that he claims has gradually worsened over time.  He indicates that he has poor short-term memory, he will forget to take medications, he will misplace things about the house frequently.  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  He tried to work 3 to 4 months ago, but he could not continue.  The patient does not do the finances, and he never has.  He lives with his mother who does this for him.  The patient is able to operate a motor vehicle without difficulty, he uses GPS to get around.  Occasionally he may forget where he is going.  He has some left-sided back pain and sciatica down the left leg, a recent MRI of the lumbar spine did not show a source of the pain.  He claims that he sleeps well at night and he has a good energy level during the day.  He has been treated for depression previously, he claims that this is moderately well controlled currently.  He denies any focal numbness or weakness of the face, arms, legs.  He denies any balance issues or falls.  He denies issues controlling the bowels or the bladder.  He reports no headaches or dizziness.  He has had occasional episodes of low blood sugar, but he has a subcutaneous monitoring system that will warn him when his blood sugars dropping.  He comes to this office for an evaluation.  Past Medical History:  Diagnosis Date  . Diabetes mellitus without complication ([place])    type 1    . Hyperlipidemia   . Hypertensive retinopathy of both eyes 05/19/2019   [facility] Exam done 05/13/2019  . Hypothyroidism   . Moderate nonproliferative diabetic retinopathy of both eyes ([place]) 05/19/2019  . Thyroid disease     Past Surgical History:  Procedure Laterality Date  . DENTAL SURGERY    . EAR CYST EXCISION Right 03/24/2020   Procedure: EXCISION PRE-AURICULAR CYST;  Surgeon: Carloyn Manner, MD;  Location: [facility];  Service: ENT;  Laterality: Right;  diabetic insulin pump    Family History  Problem Relation Age of Onset  . Diabetes Mother   . Cancer Mother        Brain, breast   . Diabetes Sister   . Diabetes Maternal Grandmother     Social history:  reports that he has been smoking cigarettes. He has a 30.00 pack-year smoking history. He has never used smokeless tobacco. He reports previous alcohol use. He reports current drug use. Drug: Marijuana.  Medications:  Prior to Admission medications   Medication Sig Start Date End Date Taking? Authorizing Provider  ACCU-CHEK FASTCLIX LANCETS MISC Use to check blood sugar up to 5 times daily. 03/05/18  Yes Fulp, Cammie, MD  ACCU-CHEK GUIDE test strip USE TO TEST BLOOD SUGAR UP TO 5 TIMES DAILY 02/17/19  Yes Fulp, Cammie, MD  baclofen (LIORESAL) 10 MG tablet Take by mouth. 02/19/18  Yes [provider]  Blood Glucose Monitoring Suppl (ACCU-CHEK GUIDE) w/Device KIT 1 kit by Does not apply route 5 (five) times daily. 03/05/18  Yes Fulp, Cammie, MD  Continuous Blood Gluc Sensor (DEXCOM G6 SENSOR) MISC SMARTSIG:Topical Every 10 Days 06/02/20  Yes [provider]  Continuous Blood Gluc Transmit (DEXCOM G6 TRANSMITTER) MISC Use to monitor blood sugar.  Replace every 3 months 03/24/20  Yes [provider]  cyclobenzaprine (FLEXERIL) 5 MG tablet TAKE 1 TABLET BY MOUTH 2 TIMES DAILY AS NEEDED FOR MUSCLE SPASMS. 03/19/20  Yes Ladell Pier, MD  FLUoxetine (PROZAC) 10 MG capsule TAKE 1 CAPSULE BY  MOUTH EVERY DAY 06/03/20  Yes Ladell Pier, MD  fluticasone [facility]) 50 MCG/ACT nasal spray Place 2 sprays into both nostrils daily. 07/30/19  Yes McClung, Dionne Bucy, PA-C  gabapentin (NEURONTIN) 600 MG tablet Take 1 tablet (600 mg total) by mouth 3 (three) times daily. 05/04/20  Yes Ladell Pier, MD  Insulin Human (INSULIN PUMP) SOLN Inject into the skin.   Yes [provider]  INSULIN SYRINGE .5CC/29G (B-D INSULIN SYRINGE) 29G X 1/2" 0.5 ML MISC USE TO INJECT INSULIN EVERY DAY. 05/28/19  Yes Fulp, Cammie, MD  levothyroxine (SYNTHROID, LEVOTHROID) 175 MCG tablet Take 1 tablet (175 mcg total) by mouth daily before breakfast. 02/11/18  Yes Fulp, Cammie, MD  lidocaine (LIDODERM) 5 % Place 1 patch onto the skin daily. Remove & Discard patch within 12 hours or as directed by MD 05/12/20  Yes Raulkar, Clide Deutscher, MD  meloxicam (MOBIC) 15 MG tablet Take 1 tablet by mouth daily. 05/29/20  Yes [provider]  NOVOLOG FLEXPEN 100 UNIT/ML FlexPen  05/18/18  Yes [provider]  Insulin Detemir (LEVEMIR FLEXTOUCH) 100 UNIT/ML Pen Inject into the skin. Patient not taking: Reported on 03/19/2020 04/18/18 04/18/19  [provider]     No Known Allergies  ROS:  Out of a complete 14 system review of symptoms, the patient complains only of the following symptoms, and all other reviewed systems are negative.   Memory problems Low back pain, left leg pain Depression  Blood pressure 122/71, pulse 78, height 5' 10"  (1.778 m), weight 194 lb (88 kg).  Physical Exam  General: The patient is alert and cooperative at the time of the examination.  Eyes: Pupils are equal, round, and reactive to light. Discs are flat bilaterally.  Neck: The neck is supple, no carotid bruits are noted.  Respiratory: The respiratory examination is clear.  Cardiovascular: The cardiovascular examination reveals a regular rate and rhythm, no obvious murmurs or rubs are noted.  Skin: Extremities  are without significant edema.  Neurologic Exam  Mental status: The patient is alert and oriented x 3 at the time of the examination. The Mini-Mental status examination done today shows a total score 27/30.  Cranial nerves: Facial symmetry is present. There is good sensation of the face to pinprick and soft touch bilaterally. The strength of the facial muscles and the muscles to head turning and shoulder shrug are normal bilaterally. Speech is well enunciated, no aphasia or dysarthria is noted. Extraocular movements are full. Visual fields are full. The tongue is midline, and the patient has symmetric elevation of the soft palate. No obvious hearing deficits are noted.  Motor: The motor testing reveals 5 over 5 strength of all 4 extremities. Good symmetric motor tone is noted throughout.  Sensory: Sensory testing is intact to pinprick, soft touch, vibration sensation, and position sense on the upper extremities.  With  the lower extremities, there is a stocking pattern pinprick sensory deficit across the ankles bilaterally with a decrease in vibration sensation in the left foot, normal on the right.  Position sense is intact both sides on the legs. no evidence of extinction is noted.  Coordination: Cerebellar testing reveals good finger-nose-finger and heel-to-shin bilaterally.  Gait and station: Gait is normal. Tandem gait is normal. Romberg is negative. No drift is seen.  The patient is able to walk on heels and the toes bilaterally.  Reflexes: Deep tendon reflexes are symmetric, but are depressed bilaterally. Toes are downgoing bilaterally.   MRI lumbar 02/06/20:  IMPRESSION: 1. Early mild lower lumbar spondylosis. Slight interval progression of noncompressive disc bulge at L4-5 without nerve root impingement. 2. No foraminal or canal stenosis at any level.    Assessment/Plan:  1.  Reported memory disturbance  2.  Chronic low back pain, left leg pain  3.  History of  depression  The patient has reported gradual decline in memory over the last 2 or 3 years.  He does have risk factors for cerebrovascular disease, he continues to smoke a pack of cigarettes daily.  He will be sent for MRI of the brain.  He has had blood work previously for vitamin B12 level, and for HIV, these were unremarkable.  He will be sent for formal neuropsychological testing, he will follow-up here in 6 months.  We may consider a medication for memory in the future.  Jill Alexanders MD 06/30/2020 2:36 PM  [facility] [address] [place], [place] [address]  Phone [phone] Fax [phone]

## 2020-07-01 ENCOUNTER — Other Ambulatory Visit: Payer: Self-pay

## 2020-07-01 ENCOUNTER — Ambulatory Visit (HOSPITAL_COMMUNITY): Payer: Medicaid Other | Admitting: Licensed Clinical Social Worker

## 2020-07-01 ENCOUNTER — Telehealth (HOSPITAL_COMMUNITY): Payer: Self-pay | Admitting: Licensed Clinical Social Worker

## 2020-07-01 NOTE — Telephone Encounter (Signed)
LCSW sent two link to pt phone first at 1400 and 2nd at 1405 with no response. LCSW f/u with PC at 1410 and LCSW left HIPAA compliant VM. LCSW stay on virtual call until 1415 before disconnecting

## 2020-07-07 ENCOUNTER — Ambulatory Visit: Payer: Medicaid Other | Admitting: Anesthesiology

## 2020-07-07 ENCOUNTER — Ambulatory Visit
Admission: RE | Admit: 2020-07-07 | Discharge: 2020-07-07 | Disposition: A | Discharge status: Home / Self Care | Payer: Medicaid Other | Attending: Otolaryngology | Admitting: Otolaryngology

## 2020-07-07 ENCOUNTER — Other Ambulatory Visit: Payer: Self-pay

## 2020-07-07 ENCOUNTER — Encounter: Payer: Self-pay | Admitting: Otolaryngology

## 2020-07-07 ENCOUNTER — Encounter
Admission: RE | Disposition: A | Discharge status: Home / Self Care | Payer: Self-pay | Source: Home / Self Care | Attending: Otolaryngology

## 2020-07-07 DIAGNOSIS — Q181 Preauricular sinus and cyst: Secondary | ICD-10-CM | POA: Diagnosis not present

## 2020-07-07 DIAGNOSIS — Z794 Long term (current) use of insulin: Secondary | ICD-10-CM | POA: Insufficient documentation

## 2020-07-07 DIAGNOSIS — E119 Type 2 diabetes mellitus without complications: Secondary | ICD-10-CM | POA: Insufficient documentation

## 2020-07-07 DIAGNOSIS — F17200 Nicotine dependence, unspecified, uncomplicated: Secondary | ICD-10-CM | POA: Insufficient documentation

## 2020-07-07 DIAGNOSIS — Z79890 Hormone replacement therapy (postmenopausal): Secondary | ICD-10-CM | POA: Insufficient documentation

## 2020-07-07 DIAGNOSIS — Z79899 Other long term (current) drug therapy: Secondary | ICD-10-CM | POA: Insufficient documentation

## 2020-07-07 DIAGNOSIS — E039 Hypothyroidism, unspecified: Secondary | ICD-10-CM | POA: Diagnosis not present

## 2020-07-07 DIAGNOSIS — Z9641 Presence of insulin pump (external) (internal): Secondary | ICD-10-CM | POA: Insufficient documentation

## 2020-07-07 HISTORY — PX: PREAURICULAR CYST EXCISION: SHX2264

## 2020-07-07 LAB — GLUCOSE, CAPILLARY
Glucose-Capillary: 139 mg/dL — ABNORMAL HIGH (ref 70–99)
Glucose-Capillary: 79 mg/dL (ref 70–99)

## 2020-07-07 SURGERY — EXCISION, CYST, PREAURICULAR
Anesthesia: General | Site: Ear | Laterality: Left

## 2020-07-07 MED ORDER — OXYCODONE HCL 5 MG PO TABS: 5.0000 mg | ORAL_TABLET | Freq: Once | ORAL | Status: DC | PRN

## 2020-07-07 MED ORDER — PROPOFOL 10 MG/ML IV BOLUS
INTRAVENOUS | Status: DC | PRN
  Administered 2020-07-07: 200 mg via INTRAVENOUS

## 2020-07-07 MED ORDER — OXYCODONE HCL 5 MG/5ML PO SOLN: 5.0000 mg | Freq: Once | ORAL | Status: DC | PRN

## 2020-07-07 MED ORDER — ACETAMINOPHEN 160 MG/5ML PO SOLN
325.0000 mg | ORAL | Status: DC | PRN
Start: 2020-07-07 — End: 2020-07-07

## 2020-07-07 MED ORDER — ACETAMINOPHEN 325 MG PO TABS: 325.0000 mg | ORAL_TABLET | ORAL | Status: DC | PRN

## 2020-07-07 MED ORDER — LIDOCAINE-EPINEPHRINE 1 %-1:100000 IJ SOLN
INTRAMUSCULAR | Status: DC | PRN
  Administered 2020-07-07: .5 mL via INTRADERMAL

## 2020-07-07 MED ORDER — ONDANSETRON HCL 4 MG/2ML IJ SOLN
INTRAMUSCULAR | Status: DC | PRN
  Administered 2020-07-07: 4 mg via INTRAVENOUS

## 2020-07-07 MED ORDER — DEXMEDETOMIDINE HCL 200 MCG/2ML IV SOLN
INTRAVENOUS | Status: DC | PRN
  Administered 2020-07-07: 5 ug via INTRAVENOUS

## 2020-07-07 MED ORDER — FENTANYL CITRATE (PF) 100 MCG/2ML IJ SOLN
INTRAMUSCULAR | Status: DC | PRN
  Administered 2020-07-07 (×2): 50 ug via INTRAVENOUS

## 2020-07-07 MED ORDER — LIDOCAINE HCL (CARDIAC) PF 100 MG/5ML IV SOSY
PREFILLED_SYRINGE | INTRAVENOUS | Status: DC | PRN
  Administered 2020-07-07: 50 mg via INTRATRACHEAL

## 2020-07-07 MED ORDER — MIDAZOLAM HCL 5 MG/5ML IJ SOLN
INTRAMUSCULAR | Status: DC | PRN
  Administered 2020-07-07: 2 mg via INTRAVENOUS

## 2020-07-07 MED ORDER — BACITRACIN 500 UNIT/GM EX OINT
TOPICAL_OINTMENT | CUTANEOUS | Status: DC | PRN
  Administered 2020-07-07: 1 via TOPICAL

## 2020-07-07 MED ORDER — LACTATED RINGERS IV SOLN: INTRAVENOUS | Status: DC

## 2020-07-07 SURGICAL SUPPLY — 28 items
APL FBRTP 3 NS LF CTTN WD (MISCELLANEOUS) ×1
APPLICATOR COTTON TIP 3IN (MISCELLANEOUS) ×3 IMPLANT
CANISTER SUCT 1200ML W/VALVE (MISCELLANEOUS) ×3 IMPLANT
CORD BIP STRL DISP 12FT (MISCELLANEOUS) ×3 IMPLANT
DRAPE HEAD BAR (DRAPES) ×3 IMPLANT
DRSG GLASSCOCK MASTOID ADT (GAUZE/BANDAGES/DRESSINGS) ×3 IMPLANT
ELECT CAUTERY NEEDLE 2.0 MIC (NEEDLE) ×3 IMPLANT
ELECT REM PT RETURN 9FT ADLT (ELECTROSURGICAL) ×3
ELECTRODE REM PT RTRN 9FT ADLT (ELECTROSURGICAL) ×1 IMPLANT
GLOVE ORTHO TXT STRL SZ7.5 (GLOVE) ×6 IMPLANT
GOWN STRL REUS W/ TWL LRG LVL3 (GOWN DISPOSABLE) ×1 IMPLANT
GOWN STRL REUS W/TWL LRG LVL3 (GOWN DISPOSABLE) ×3
KIT TURNOVER KIT A (KITS) ×3 IMPLANT
NEEDLE HYPO 25GX1X1/2 BEV (NEEDLE) ×3 IMPLANT
NS IRRIG 500ML POUR BTL (IV SOLUTION) ×3 IMPLANT
PACK ENT CUSTOM (PACKS) ×3 IMPLANT
PENCIL SMOKE EVACUATOR (MISCELLANEOUS) ×3 IMPLANT
SLEEVE PROTECTION STRL DISP (MISCELLANEOUS) ×6 IMPLANT
SOL PREP PVP 2OZ (MISCELLANEOUS) ×3
SOLUTION PREP PVP 2OZ (MISCELLANEOUS) ×1 IMPLANT
STRAP BODY AND KNEE 60X3 (MISCELLANEOUS) ×3 IMPLANT
SUCTION FRAZIER HANDLE 10FR (MISCELLANEOUS) ×2
SUCTION TUBE FRAZIER 10FR DISP (MISCELLANEOUS) ×1 IMPLANT
SUT ETHILON 6 0 9-3 1X18 BLK (SUTURE) ×3 IMPLANT
SUT VIC AB 5-0 P-3 18X BRD (SUTURE) ×1 IMPLANT
SUT VIC AB 5-0 P3 18 (SUTURE) ×3
SYR 10ML LL (SYRINGE) ×3 IMPLANT
TOWEL OR 17X26 4PK STRL BLUE (TOWEL DISPOSABLE) ×3 IMPLANT

## 2020-07-07 NOTE — Transfer of Care (Signed)
Immediate Anesthesia Transfer of Care Note  Patient: Ronald Banks  Procedure(s) Performed: EXCISION PREAURICULAR CYST ADULT (Left: Ear)  Patient Location: PACU  Anesthesia Type: General  Level of Consciousness: awake, alert  and patient cooperative  Airway and Oxygen Therapy: Patient Spontanous Breathing and Patient connected to supplemental oxygen  Post-op Assessment: Post-op Vital signs reviewed, Patient's Cardiovascular Status Stable, Respiratory Function Stable, Patent Airway and No signs of Nausea or vomiting  Post-op Vital Signs: Reviewed and stable  Complications: No notable events documented.

## 2020-07-07 NOTE — Anesthesia Postprocedure Evaluation (Signed)
Anesthesia Post Note  Patient: Ronald Banks  Procedure(s) Performed: EXCISION PREAURICULAR CYST ADULT (Left: Ear)     Patient location during evaluation: PACU Anesthesia Type: General Level of consciousness: awake and alert Pain management: pain level controlled Vital Signs Assessment: post-procedure vital signs reviewed and stable Respiratory status: spontaneous breathing, nonlabored ventilation, respiratory function stable and patient connected to nasal cannula oxygen Cardiovascular status: blood pressure returned to baseline and stable Postop Assessment: no apparent nausea or vomiting Anesthetic complications: no   No notable events documented.  Trecia Rogers

## 2020-07-07 NOTE — Anesthesia Preprocedure Evaluation (Signed)
Anesthesia Evaluation  Patient identified by MRN, date of birth, ID band Patient awake    Reviewed: Allergy & Precautions, H&P , NPO status , Patient's Chart, lab work & pertinent test results, reviewed documented beta blocker date and time   Airway Mallampati: I  TM Distance: >3 FB Neck ROM: full    Dental no notable dental hx.    Pulmonary Current Smoker,    Pulmonary exam normal breath sounds clear to auscultation       Cardiovascular Exercise Tolerance: Good hypertension, Normal cardiovascular exam Rhythm:regular Rate:Normal     Neuro/Psych Anxiety Depression negative neurological ROS     GI/Hepatic negative GI ROS, Neg liver ROS,   Endo/Other  diabetes, Well Controlled, Type 1Hypothyroidism   Renal/GU negative Renal ROS  negative genitourinary   Musculoskeletal   Abdominal   Peds  Hematology negative hematology ROS (+)   Anesthesia Other Findings   Reproductive/Obstetrics negative OB ROS                             Anesthesia Physical Anesthesia Plan  ASA: 3  Anesthesia Plan: General   Post-op Pain Management:    Induction:   PONV Risk Score and Plan:   Airway Management Planned:   Additional Equipment:   Intra-op Plan:   Post-operative Plan:   Informed Consent: I have reviewed the patients History and Physical, chart, labs and discussed the procedure including the risks, benefits and alternatives for the proposed anesthesia with the patient or authorized representative who has indicated his/her understanding and acceptance.     Dental Advisory Given  Plan Discussed with: CRNA and Anesthesiologist  Anesthesia Plan Comments:         Anesthesia Quick Evaluation

## 2020-07-07 NOTE — Anesthesia Procedure Notes (Addendum)
Procedure Name: LMA Insertion Date/Time: 07/07/2020 10:59 AM Performed by: Jeannene Patella, CRNA Pre-anesthesia Checklist: Patient identified, Emergency Drugs available, Suction available, Patient being monitored and Timeout performed Patient Re-evaluated:Patient Re-evaluated prior to induction Oxygen Delivery Method: Circle system utilized Preoxygenation: Pre-oxygenation with 100% oxygen Induction Type: IV induction Ventilation: Mask ventilation without difficulty LMA: LMA inserted LMA Size: 4.0 Number of attempts: 1 Placement Confirmation: positive ETCO2 and breath sounds checked- equal and bilateral Tube secured with: Tape Dental Injury: Teeth and Oropharynx as per pre-operative assessment

## 2020-07-07 NOTE — H&P (Signed)
..  History and Physical paper copy reviewed and updated date of procedure and will be scanned into system.  Patient seen and examined and marked.

## 2020-07-07 NOTE — Op Note (Signed)
..  07/07/2020  11:22 AM    Tonie Griffith  [id number]   Pre-Op Dx:  PREAURICULAR CYST  Post-op Dx: PREAURICULAR CYST  Proc:Excision of Left preauricular cyst  Surg: Jeannie Fend Ailsa Mireles  Anes:  General by laryngeal mask  EBL:  None  Comp:  None  Findings:  Left preauricular pit and sinus tract and cyst removed.  Procedure: With the patient in a comfortable supine position, general laryngeal mask anesthesia was administered.  11m of 1% lidocaine and 1:100,000 epinephrine was injected to the previous marked skin crease.  The patient was prepped a draped in a sterile fashion.  At this time, a 15 blade scalpel was used to make an elliptical skin incision surrounding the left preauricular pit.  An alice clamp and lacrimal probe were then used to grasp the pit and sinus tract.  Using a 15 blade scalpel, the sinus tract was followed until the cyst was in connection with the cartilage.  This was excised and passed off the field for permanent pathological evalaution.    The wound was irrigated with sterile saline.  The skin was closed with deep 5.0 vicryl and skin closed with running 6.0 Nylon.  Following this  The patient was returned to anesthesia, awakened, and transferred to recovery in stable condition.  Dispo:  PACU to home  Plan: Recheck in my office Tuesday for suture removal.   Aimi Essner 11:22 AM 07/07/2020

## 2020-07-08 ENCOUNTER — Encounter: Payer: Self-pay | Admitting: Otolaryngology

## 2020-07-08 ENCOUNTER — Encounter: Payer: Self-pay | Admitting: Psychology

## 2020-07-08 LAB — SURGICAL PATHOLOGY

## 2020-07-13 ENCOUNTER — Telehealth: Payer: Self-pay | Admitting: Neurology

## 2020-07-13 NOTE — Telephone Encounter (Signed)
I called [employer] @ [phone] and spoke with Ubaldo Glassing to check PA requirements for MRI brain wo contrast ([id number]). Ubaldo Glassing states additional information needs to be faxed to [phone]. Tracking #[id number]. Pending. Faxed office notes once call ended.

## 2020-07-22 NOTE — Telephone Encounter (Signed)
MRI was denied for dx code R41.3 (memory change). Denial states the following:  Doctor's notes do not say that you have memory tests (MMSE) or MoCA with low scores (less than 26). You also need to do blood tests for this problem (thyroid tests, complete blood count, complete metabolic panel, vitamin D98 levels). We need to know the blood markers are not the cause of your memory problems. Without this information, your request did not meet criteria for approval found in [employer] Clinical Guideline 001 for Brain (head) MRI.

## 2020-07-27 NOTE — Telephone Encounter (Signed)
I called patient regarding MRI/appeal. Explained that we will need his signature on the appeal form in order for it to be processed. He states that he will be able to come in next week on Tuesday to do so. I advised that I would call him the morning of to remind him.

## 2020-08-03 NOTE — Telephone Encounter (Signed)
I called patient and LVM asking him to return my call regarding signing appeal form for MRI. Advised appeal form would be placed up at the front desk and he can come in and sign it anytime today until 5:00. Provided my callback info in case he is not able to make it in today or has questions.

## 2020-08-03 NOTE — Telephone Encounter (Signed)
Patient was able to come in today and sign the appeal form. I faxed the appeal with all notes in patient's chart to [facility] @ [phone]. Phone: [phone].

## 2020-08-11 NOTE — Telephone Encounter (Signed)
Appeal was denied. They require MMSE score to be less than 26 for approval. Patient's MMSE score was 27. I was advised that a peer to peer can be done by calling [employer] @ [phone]. Tracking #[id number].

## 2020-08-11 NOTE — Telephone Encounter (Signed)
I called for peer to peer review, authorization was given, good until 11 September 2020.  Authorization number is [id number].

## 2020-08-16 ENCOUNTER — Observation Stay
Admission: EM | Admit: 2020-08-16 | Discharge: 2020-08-17 | Disposition: A | Discharge status: Home / Self Care | Payer: Medicaid Other | Attending: Obstetrics and Gynecology | Admitting: Obstetrics and Gynecology

## 2020-08-16 ENCOUNTER — Emergency Department: Payer: Medicaid Other

## 2020-08-16 ENCOUNTER — Encounter: Payer: Self-pay | Admitting: Radiology

## 2020-08-16 ENCOUNTER — Other Ambulatory Visit: Payer: Self-pay

## 2020-08-16 DIAGNOSIS — Z794 Long term (current) use of insulin: Secondary | ICD-10-CM | POA: Diagnosis not present

## 2020-08-16 DIAGNOSIS — E039 Hypothyroidism, unspecified: Secondary | ICD-10-CM | POA: Diagnosis not present

## 2020-08-16 DIAGNOSIS — Z79899 Other long term (current) drug therapy: Secondary | ICD-10-CM | POA: Diagnosis not present

## 2020-08-16 DIAGNOSIS — E1010 Type 1 diabetes mellitus with ketoacidosis without coma: Secondary | ICD-10-CM | POA: Diagnosis not present

## 2020-08-16 DIAGNOSIS — F17210 Nicotine dependence, cigarettes, uncomplicated: Secondary | ICD-10-CM | POA: Insufficient documentation

## 2020-08-16 DIAGNOSIS — R112 Nausea with vomiting, unspecified: Secondary | ICD-10-CM | POA: Diagnosis present

## 2020-08-16 DIAGNOSIS — I10 Essential (primary) hypertension: Secondary | ICD-10-CM | POA: Diagnosis not present

## 2020-08-16 DIAGNOSIS — Z20822 Contact with and (suspected) exposure to covid-19: Secondary | ICD-10-CM | POA: Insufficient documentation

## 2020-08-16 DIAGNOSIS — E1110 Type 2 diabetes mellitus with ketoacidosis without coma: Secondary | ICD-10-CM | POA: Diagnosis present

## 2020-08-16 LAB — CBG MONITORING, ED
Glucose-Capillary: 364 mg/dL — ABNORMAL HIGH (ref 70–99)
Glucose-Capillary: 333 mg/dL — ABNORMAL HIGH (ref 70–99)
Glucose-Capillary: 227 mg/dL — ABNORMAL HIGH (ref 70–99)
Glucose-Capillary: 204 mg/dL — ABNORMAL HIGH (ref 70–99)

## 2020-08-16 LAB — BLOOD GAS, VENOUS
Acid-base deficit: 12.3 mmol/L — ABNORMAL HIGH (ref 0.0–2.0)
Bicarbonate: 14.8 mmol/L — ABNORMAL LOW (ref 20.0–28.0)
O2 Saturation: 88.6 %
Patient temperature: 37
pCO2, Ven: 37 mmHg — ABNORMAL LOW (ref 44.0–60.0)
pH, Ven: 7.21 — ABNORMAL LOW (ref 7.250–7.430)
pO2, Ven: 68 mmHg — ABNORMAL HIGH (ref 32.0–45.0)

## 2020-08-16 LAB — CBC
HCT: 46.2 % (ref 39.0–52.0)
Hemoglobin: 16.1 g/dL (ref 13.0–17.0)
MCH: 32.5 pg (ref 26.0–34.0)
MCHC: 34.8 g/dL (ref 30.0–36.0)
MCV: 93.1 fL (ref 80.0–100.0)
Platelets: 272 10*3/uL (ref 150–400)
RBC: 4.96 MIL/uL (ref 4.22–5.81)
RDW: 12.7 % (ref 11.5–15.5)
WBC: 24.6 10*3/uL — ABNORMAL HIGH (ref 4.0–10.5)
nRBC: 0 % (ref 0.0–0.2)

## 2020-08-16 LAB — URINALYSIS, COMPLETE (UACMP) WITH MICROSCOPIC
Bacteria, UA: NONE SEEN
Bilirubin Urine: NEGATIVE
Glucose, UA: >=500 mg/dL — AB
Ketones, ur: 80 mg/dL — AB
Leukocytes,Ua: NEGATIVE
Nitrite: NEGATIVE
Protein, ur: NEGATIVE mg/dL
Specific Gravity, Urine: 1.021 (ref 1.005–1.030)
Squamous Epithelial / HPF: NONE SEEN (ref 0–5)
pH: 6 (ref 5.0–8.0)

## 2020-08-16 LAB — BASIC METABOLIC PANEL
Anion gap: 14 (ref 5–15)
Anion gap: 20 — ABNORMAL HIGH (ref 5–15)
BUN: 17 mg/dL (ref 6–20)
BUN: 21 mg/dL — ABNORMAL HIGH (ref 6–20)
CO2: 22 mmol/L (ref 22–32)
CO2: 16 mmol/L — ABNORMAL LOW (ref 22–32)
Calcium: 8.9 mg/dL (ref 8.9–10.3)
Calcium: 9.2 mg/dL (ref 8.9–10.3)
Chloride: 94 mmol/L — ABNORMAL LOW (ref 98–111)
Chloride: 93 mmol/L — ABNORMAL LOW (ref 98–111)
Creatinine, Ser: 1.22 mg/dL (ref 0.61–1.24)
Creatinine, Ser: 1.3 mg/dL — ABNORMAL HIGH (ref 0.61–1.24)
GFR, Estimated: >60 mL/min (ref >60)
GFR, Estimated: >60 mL/min (ref >60)
Glucose, Bld: 354 mg/dL — ABNORMAL HIGH (ref 70–99)
Glucose, Bld: 378 mg/dL — ABNORMAL HIGH (ref 70–99)
Potassium: 5.9 mmol/L — ABNORMAL HIGH (ref 3.5–5.1)
Potassium: 5.1 mmol/L (ref 3.5–5.1)
Sodium: 130 mmol/L — ABNORMAL LOW (ref 135–145)
Sodium: 129 mmol/L — ABNORMAL LOW (ref 135–145)

## 2020-08-16 LAB — LACTIC ACID, PLASMA: Lactic Acid, Venous: 1.6 mmol/L (ref 0.5–1.9)

## 2020-08-16 LAB — BETA-HYDROXYBUTYRIC ACID: Beta-Hydroxybutyric Acid: 7.83 mmol/L — ABNORMAL HIGH (ref 0.05–0.27)

## 2020-08-16 MED ORDER — SODIUM CHLORIDE 0.9 % IV BOLUS
1000.0000 mL | Freq: Once | INTRAVENOUS | Status: AC
  Administered 2020-08-16: 1000 mL via INTRAVENOUS

## 2020-08-16 MED ORDER — PROCHLORPERAZINE EDISYLATE 10 MG/2ML IJ SOLN
10.0000 mg | Freq: Once | INTRAMUSCULAR | Status: AC
  Administered 2020-08-16: 10 mg via INTRAVENOUS
  Filled 2020-08-16: qty 2

## 2020-08-16 MED ORDER — GABAPENTIN 300 MG PO CAPS
600.0000 mg | ORAL_CAPSULE | Freq: Three times a day (TID) | ORAL | Status: DC
  Administered 2020-08-17: 600 mg via ORAL
  Filled 2020-08-16 (×2): qty 2

## 2020-08-16 MED ORDER — LEVOTHYROXINE SODIUM 50 MCG PO TABS
175.0000 ug | ORAL_TABLET | Freq: Every day | ORAL | Status: DC
  Administered 2020-08-17: 175 ug via ORAL
  Filled 2020-08-16: qty 4

## 2020-08-16 MED ORDER — ONDANSETRON HCL 4 MG PO TABS: 4.0000 mg | ORAL_TABLET | Freq: Four times a day (QID) | ORAL | Status: DC | PRN

## 2020-08-16 MED ORDER — NICOTINE 21 MG/24HR TD PT24: 21.0000 mg | MEDICATED_PATCH | Freq: Every day | TRANSDERMAL | Status: DC

## 2020-08-16 MED ORDER — DEXTROSE IN LACTATED RINGERS 5 % IV SOLN: INTRAVENOUS | Status: DC

## 2020-08-16 MED ORDER — ONDANSETRON HCL 4 MG/2ML IJ SOLN: 4.0000 mg | Freq: Four times a day (QID) | INTRAMUSCULAR | Status: DC | PRN

## 2020-08-16 MED ORDER — DEXTROSE 50 % IV SOLN: 0.0000 mL | INTRAVENOUS | Status: DC | PRN

## 2020-08-16 MED ORDER — MORPHINE SULFATE (PF) 2 MG/ML IV SOLN: 2.0000 mg | INTRAVENOUS | Status: DC | PRN

## 2020-08-16 MED ORDER — INSULIN REGULAR(HUMAN) IN NACL 100-0.9 UT/100ML-% IV SOLN
INTRAVENOUS | Status: DC
  Administered 2020-08-16: 12 [IU]/h via INTRAVENOUS
  Filled 2020-08-16 (×2): qty 100

## 2020-08-16 MED ORDER — HYDRALAZINE HCL 20 MG/ML IJ SOLN: 10.0000 mg | INTRAMUSCULAR | Status: DC | PRN

## 2020-08-16 MED ORDER — ENOXAPARIN SODIUM 40 MG/0.4ML IJ SOSY
40.0000 mg | PREFILLED_SYRINGE | INTRAMUSCULAR | Status: DC
  Administered 2020-08-17: 40 mg via SUBCUTANEOUS
  Filled 2020-08-16: qty 0.4

## 2020-08-16 MED ORDER — DIPHENHYDRAMINE HCL 50 MG/ML IJ SOLN
25.0000 mg | Freq: Once | INTRAMUSCULAR | Status: AC
  Administered 2020-08-16: 25 mg via INTRAVENOUS
  Filled 2020-08-16: qty 1

## 2020-08-16 MED ORDER — HYDROCODONE-ACETAMINOPHEN 5-325 MG PO TABS: 1.0000 | ORAL_TABLET | ORAL | Status: DC | PRN

## 2020-08-16 MED ORDER — ACETAMINOPHEN 325 MG PO TABS: 650.0000 mg | ORAL_TABLET | Freq: Four times a day (QID) | ORAL | Status: DC | PRN

## 2020-08-16 MED ORDER — POLYETHYLENE GLYCOL 3350 17 G PO PACK: 17.0000 g | PACK | Freq: Every day | ORAL | Status: DC | PRN

## 2020-08-16 MED ORDER — LACTATED RINGERS IV SOLN: INTRAVENOUS | Status: DC

## 2020-08-16 MED ORDER — ACETAMINOPHEN 650 MG RE SUPP: 650.0000 mg | Freq: Four times a day (QID) | RECTAL | Status: DC | PRN

## 2020-08-16 NOTE — ED Provider Notes (Signed)
[facility] Emergency Department Provider Note  ____________________________________________   Event Date/Time   First MD Initiated Contact with Patient 08/16/20 1921     (approximate)  I have reviewed the triage vital signs and the nursing notes.   HISTORY  Chief Complaint Hyperglycemia    HPI Ronald Banks is a 45 y.o. male  here with nausea, vomiting, fatigue. Pt is a type 1 diabetic. He states his insulin pump was not working this morning, and he shortly thereafter began to feel nauseous, weak, and SOB. Reports that throughout the day he's had ongoing SOB, dry mouth, and fatigue. This has worsened over past several hours. Denies any preceding illnesses or recent med changes. He believes the tubing on his pump may be messed up. Reports a h/o recurrent DKA. No other complaints. No fevers, chills. Mild SOB noted now at rest.        Past Medical History:  Diagnosis Date   Diabetes mellitus without complication ([place])    type 1    Hyperlipidemia    Hypertensive retinopathy of both eyes 05/19/2019   [facility] Exam done 05/13/2019   Hypothyroidism    Moderate nonproliferative diabetic retinopathy of both eyes ([place]) 05/19/2019   Thyroid disease     Patient Active Problem List   Diagnosis Date Noted   DKA (diabetic ketoacidosis) ([place]) 08/17/2020   DKA, type 1 ([place]) 08/16/2020   Hyperlipidemia due to type 1 diabetes mellitus ([place]) 05/04/2020   Unexplained weight loss 11/20/2019   Tobacco dependence 10/09/2019   Major depression, recurrent, chronic ([place]) 10/09/2019   Memory difficulty 10/09/2019   Hyperlipidemia associated with type 2 diabetes mellitus ([place]) 10/09/2019   Moderate nonproliferative diabetic retinopathy of both eyes ([place]) 05/19/2019   Hypertensive retinopathy of both eyes 05/19/2019   DOE (dyspnea on exertion) 05/19/2019   Upper airway cough syndrome 04/07/2019   Essential hypertension 04/07/2019   DM type 1 with diabetic  peripheral neuropathy ([place]) 02/11/2018   Hyperlipidemia LDL goal <70 02/11/2018   Hypothyroidism 02/11/2018   Chronic right shoulder pain 02/11/2018   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 Authorizing Provider  baclofen (LIORESAL) 10 MG tablet Take 10 mg by mouth 3 (three) times daily as needed. 02/19/18  Yes [provider]  FLUoxetine (PROZAC) 10 MG capsule TAKE 1 CAPSULE BY MOUTH EVERY DAY 06/03/20  Yes Ladell Pier, MD  fluticasone [facility]) 50 MCG/ACT nasal spray Place 2 sprays into both nostrils daily. 07/30/19  Yes McClung, Dionne Bucy, PA-C  gabapentin (NEURONTIN) 600 MG tablet Take 1 tablet (600 mg total) by mouth 3 (three) times daily. 05/04/20  Yes Ladell Pier, MD  insulin aspart (NOVOLOG) 100 UNIT/ML injection Up to 100 units daily in pump 08/12/20  Yes [provider]  Insulin Detemir (LEVEMIR FLEXTOUCH) 100 UNIT/ML Pen Inject into the skin. 04/18/18 08/17/20 Yes [provider]  levothyroxine (SYNTHROID) 125 MCG  tablet Take 250 mcg by mouth See admin instructions. Take 2 tablets daily Monday through Saturday. 07/29/20  Yes [provider]  meloxicam (MOBIC) 15 MG tablet Take 1 tablet by mouth daily. 05/29/20  Yes [provider]  ACCU-CHEK FASTCLIX LANCETS MISC Use to check blood sugar up to 5 times daily. 03/05/18   Fulp, Cammie, MD  ACCU-CHEK GUIDE  test strip USE TO TEST BLOOD SUGAR UP TO 5 TIMES DAILY 02/17/19   Fulp, Cammie, MD  Blood Glucose Monitoring Suppl (ACCU-CHEK GUIDE) w/Device KIT 1 kit by Does not apply route 5 (five) times daily. 03/05/18   Fulp, Cammie, MD  Continuous Blood Gluc Sensor (DEXCOM G6 SENSOR) MISC SMARTSIG:Topical Every 10 Days 06/02/20   [provider]  Continuous Blood Gluc Transmit (DEXCOM G6 TRANSMITTER) MISC Use to monitor blood sugar.  Replace every 3 months 03/24/20   [provider]  cyclobenzaprine (FLEXERIL) 5 MG tablet TAKE 1 TABLET BY MOUTH 2 TIMES DAILY AS NEEDED FOR MUSCLE SPASMS. 03/19/20   Ladell Pier, MD  Insulin Human (INSULIN PUMP) SOLN Inject into the skin.    [provider]  INSULIN SYRINGE .5CC/29G (B-D INSULIN SYRINGE) 29G X 1/2" 0.5 ML MISC USE TO INJECT INSULIN EVERY DAY. 05/28/19   Fulp, Cammie, MD  lidocaine (LIDODERM) 5 % Place 1 patch onto the skin daily. Remove & Discard patch within 12 hours or as directed by MD 05/12/20   Ranell Patrick, Clide Deutscher, MD    Allergies Patient has no known allergies.  Family History  Problem Relation Age of Onset   Diabetes Mother    Cancer Mother        Brain, breast    Diabetes Sister    Diabetes Maternal Grandmother     Social History Social History   Tobacco Use   Smoking status: Every Day    Packs/day: 1.00    Years: 30.00    Pack years: 30.00    Types: Cigarettes   Smokeless tobacco: Never  Vaping Use   Vaping Use: Never used  Substance Use Topics   Alcohol use: Not Currently   Drug use: Yes    Types: Marijuana    Comment: Several times per week    Review of Systems  Review of Systems  Constitutional:  Positive for fatigue. Negative for chills and fever.  HENT:  Negative for sore throat.   Respiratory:  Positive for shortness of breath.   Cardiovascular:  Negative for chest pain.  Gastrointestinal:  Positive for nausea and vomiting. Negative for abdominal pain.  Endocrine: Positive for polydipsia and  polyuria.  Genitourinary:  Negative for flank pain.  Musculoskeletal:  Negative for neck pain.  Skin:  Negative for rash and wound.  Allergic/Immunologic: Negative for immunocompromised state.  Neurological:  Positive for weakness. Negative for numbness.  Hematological:  Does not bruise/bleed easily.  All other systems reviewed and are negative.   ____________________________________________  PHYSICAL EXAM:      VITAL SIGNS: ED Triage Vitals  Enc Vitals Group     BP 08/16/20 1240 (!) 143/63     Pulse Rate 08/16/20 1240 82     Resp 08/16/20 1240 18     Temp 08/16/20 1240 97.6 F (36.4 C)     Temp Source 08/16/20 1240 Oral     SpO2 08/16/20 1240 99 %     Weight 08/16/20 1241 187 lb (84.8 kg)     Height 08/16/20 1241 5' 10"  (1.778 m)     Head Circumference --  Peak Flow --      Pain Score 08/16/20 1240 7     Pain Loc --      Pain Edu? --      Excl. in [place]? --      Physical Exam Vitals and nursing note reviewed.  Constitutional:      General: He is not in acute distress.    Appearance: He is well-developed.  HENT:     Head: Normocephalic and atraumatic.     Mouth/Throat:     Mouth: Mucous membranes are dry.  Eyes:     Conjunctiva/sclera: Conjunctivae normal.  Cardiovascular:     Rate and Rhythm: Regular rhythm. Tachycardia present.     Heart sounds: Normal heart sounds. No murmur heard.   No friction rub.  Pulmonary:     Effort: Pulmonary effort is normal. No respiratory distress.     Breath sounds: Normal breath sounds. No wheezing or rales.  Abdominal:     General: There is no distension.     Palpations: Abdomen is soft.     Tenderness: There is no abdominal tenderness.  Musculoskeletal:     Cervical back: Neck supple.  Skin:    General: Skin is warm.     Capillary Refill: Capillary refill takes less than 2 seconds.  Neurological:     Mental Status: He is alert and oriented to person, place, and time.     Motor: No abnormal muscle tone.       ____________________________________________   LABS (all labs ordered are listed, but only abnormal results are displayed)  Labs Reviewed  BASIC METABOLIC PANEL - Abnormal; Notable for the following components:      Result Value   Sodium 130 (*)    Potassium 5.9 (*)    Chloride 94 (*)    Glucose, Bld 354 (*)    All other components within normal limits  CBC - Abnormal; Notable for the following components:   WBC 24.6 (*)    All other components within normal limits  URINALYSIS, COMPLETE (UACMP) WITH MICROSCOPIC - Abnormal; Notable for the following components:   Color, Urine YELLOW (*)    APPearance CLEAR (*)    Glucose, UA >=500 (*)    Hgb urine dipstick SMALL (*)    Ketones, ur 80 (*)    All other components within normal limits  BASIC METABOLIC PANEL - Abnormal; Notable for the following components:   Sodium 129 (*)    Chloride 93 (*)    CO2 16 (*)    Glucose, Bld 378 (*)    BUN 21 (*)    Creatinine, Ser 1.30 (*)    Anion gap 20 (*)    All other components within normal limits  BETA-HYDROXYBUTYRIC ACID - Abnormal; Notable for the following components:   Beta-Hydroxybutyric Acid 7.83 (*)    All other components within normal limits  BLOOD GAS, VENOUS - Abnormal; Notable for the following components:   pH, Ven 7.21 (*)    pCO2, Ven 37 (*)    pO2, Ven 68.0 (*)    Bicarbonate 14.8 (*)    Acid-base deficit 12.3 (*)    All other components within normal limits  TSH - Abnormal; Notable for the following components:   TSH 10.242 (*)    All other components within normal limits  BASIC METABOLIC PANEL - Abnormal; Notable for the following components:   Sodium 134 (*)    Calcium 8.4 (*)    All other components within normal limits  CBG  MONITORING, ED - Abnormal; Notable for the following components:   Glucose-Capillary 364 (*)    All other components within normal limits  CBG MONITORING, ED - Abnormal; Notable for the following components:   Glucose-Capillary 333  (*)    All other components within normal limits  CBG MONITORING, ED - Abnormal; Notable for the following components:   Glucose-Capillary 227 (*)    All other components within normal limits  CBG MONITORING, ED - Abnormal; Notable for the following components:   Glucose-Capillary 204 (*)    All other components within normal limits  CBG MONITORING, ED - Abnormal; Notable for the following components:   Glucose-Capillary 186 (*)    All other components within normal limits  CBG MONITORING, ED - Abnormal; Notable for the following components:   Glucose-Capillary 166 (*)    All other components within normal limits  CBG MONITORING, ED - Abnormal; Notable for the following components:   Glucose-Capillary 147 (*)    All other components within normal limits  CBG MONITORING, ED - Abnormal; Notable for the following components:   Glucose-Capillary 133 (*)    All other components within normal limits  CBG MONITORING, ED - Abnormal; Notable for the following components:   Glucose-Capillary 112 (*)    All other components within normal limits  RESP PANEL BY RT-PCR (FLU A&B, COVID) ARPGX2  LACTIC ACID, PLASMA  BETA-HYDROXYBUTYRIC ACID  LACTIC ACID, PLASMA  HEMOGLOBIN A1C  CBG MONITORING, ED  CBG MONITORING, ED    ____________________________________________  EKG: Normal sinus rhythm, VR 83. PR 164, QRS 80, QTc 425. No acute St elevations or depressions. No ischemia or infarct. ________________________________________  RADIOLOGY All imaging, including plain films, CT scans, and ultrasounds, independently reviewed by me, and interpretations confirmed via formal radiology reads.  ED MD interpretation:   CXR: Clear  Official radiology report(s): DG Chest Portable 1 View  Result Date: 08/16/2020 CLINICAL DATA:  SOB, DKA, eval PNA.  Hyperglycemia EXAM: PORTABLE CHEST 1 VIEW COMPARISON:  07/13/2019, 04/07/2019 FINDINGS: The heart size and mediastinal contours are within normal limits.  Slightly prominent interstitial markings are similar in appearance to the previous films. No focal airspace consolidation, pleural effusion, or pneumothorax. The visualized skeletal structures are unremarkable. IMPRESSION: No active disease. Electronically Signed   By: Davina Poke D.O.   On: 08/16/2020 20:35    ____________________________________________  PROCEDURES   Procedure(s) performed (including Critical Care):  .Critical Care  Date/Time: 08/17/2020 10:03 AM Performed by: Duffy Bruce, MD Authorized by: Duffy Bruce, MD   Critical care provider statement:    Critical care time (minutes):  35   Critical care time was exclusive of:  Separately billable procedures and treating other patients and teaching time   Critical care was necessary to treat or prevent imminent or life-threatening deterioration of the following conditions:  Circulatory failure, cardiac failure, dehydration and metabolic crisis   Critical care was time spent personally by me on the following activities:  Development of treatment plan with patient or surrogate, discussions with consultants, evaluation of patient's response to treatment, examination of patient, obtaining history from patient or surrogate, ordering and performing treatments and interventions, ordering and review of laboratory studies, ordering and review of radiographic studies, pulse oximetry, re-evaluation of patient's condition and review of old charts   I assumed direction of critical care for this patient from another provider in my specialty: no    ____________________________________________  INITIAL IMPRESSION / MDM / [place] / ED COURSE  As part of  my medical decision making, I reviewed the following data within the [place] notes reviewed and incorporated, Old chart reviewed, Notes from prior ED visits, and [place] Controlled Substance Database       *NOX TALENT was evaluated in Emergency  Department on 08/17/2020 for the symptoms described in the history of present illness. He was evaluated in the context of the global COVID-19 pandemic, which necessitated consideration that the patient might be at risk for infection with the SARS-CoV-2 virus that causes COVID-19. Institutional protocols and algorithms that pertain to the evaluation of patients at risk for COVID-19 are in a state of rapid change based on information released by regulatory bodies including the CDC and federal and state organizations. These policies and algorithms were followed during the patient's care in the ED.  Some ED evaluations and interventions may be delayed as a result of limited staffing during the pandemic.*    Medical Decision Making:  45 yo M with T1DM here with SOB, nausea. On arrival, pt hyperglycemic with significant ketonuria, hyperglycemia. Repeat BMP shows worsening acidemia with elevated AG c/w DKA. Pt started on fluids, insulin drip, and will admit. No apparent trigger beyond reported malfunction of insulin pump. Denies any fever or recent infection. EKG nonischemic. Admit to medicine. pH 7.21, BHB elevated c/w DKA.   ____________________________________________  FINAL CLINICAL IMPRESSION(S) / ED DIAGNOSES  Final diagnoses:  Diabetic ketoacidosis without coma associated with type 1 diabetes mellitus ([place])     MEDICATIONS GIVEN DURING THIS VISIT:  Medications  insulin regular, human (MYXREDLIN) 100 units/ 100 mL infusion (0 Units/hr Intravenous Paused 08/17/20 0756)  lactated ringers infusion ( Intravenous Not Given 08/17/20 0027)  dextrose 5 % in lactated ringers infusion (0 mLs Intravenous Stopped 08/17/20 0756)  dextrose 50 % solution 0-50 mL (has no administration in time range)  enoxaparin (LOVENOX) injection 40 mg (40 mg Subcutaneous Given 08/17/20 0038)  acetaminophen (TYLENOL) tablet 650 mg (has no administration in time range)    Or  acetaminophen (TYLENOL) suppository 650 mg (has no  administration in time range)  HYDROcodone-acetaminophen (NORCO/VICODIN) 5-325 MG per tablet 1-2 tablet (has no administration in time range)  morphine 2 MG/ML injection 2 mg (has no administration in time range)  polyethylene glycol (MIRALAX / GLYCOLAX) packet 17 g (has no administration in time range)  ondansetron (ZOFRAN) tablet 4 mg (has no administration in time range)    Or  ondansetron (ZOFRAN) injection 4 mg (has no administration in time range)  hydrALAZINE (APRESOLINE) injection 10 mg (has no administration in time range)  levothyroxine (SYNTHROID) tablet 175 mcg (175 mcg Oral Given 08/17/20 0628)  gabapentin (NEURONTIN) capsule 600 mg (600 mg Oral Given 08/17/20 0039)  nicotine (NICODERM CQ - dosed in mg/24 hours) patch 21 mg (has no administration in time range)  insulin detemir (LEVEMIR) injection 25 Units (25 Units Subcutaneous Given 08/17/20 0633)  insulin aspart (novoLOG) injection 0-5 Units (has no administration in time range)  insulin aspart (novoLOG) injection 0-15 Units (0 Units Subcutaneous Not Given 08/17/20 0754)  sodium chloride 0.9 % bolus 1,000 mL (0 mLs Intravenous Stopped 08/16/20 2226)  sodium chloride 0.9 % bolus 1,000 mL (0 mLs Intravenous Stopped 08/16/20 2225)  prochlorperazine (COMPAZINE) injection 10 mg (10 mg Intravenous Given 08/16/20 2013)  diphenhydrAMINE (BENADRYL) injection 25 mg (25 mg Intravenous Given 08/16/20 2012)     ED Discharge Orders          Ordered    Increase activity slowly  08/17/20 0820    Diet Carb Modified        08/17/20 0820             Note:  This document was prepared using Dragon voice recognition software and may include unintentional dictation errors.   Duffy Bruce, MD 08/17/20 1005

## 2020-08-16 NOTE — ED Provider Notes (Signed)
Emergency Medicine Provider Triage Evaluation Note  Ronald Banks , a 45 y.o. male  was evaluated in triage.  Pt complains of hyperglycemia. He has an insulin pump that he reports malfunctioned. He was at work and drove the [employer]. He c/o nausea and not feeling well. He denies any pain, FCS, chest pain or SOB.  Review of Systems  Positive: hyperglycemia Negative: FCS, abdominal/chest pain  Physical Exam  BP (!) 143/63 (BP Location: Left Arm)   Pulse 82   Temp 97.6 F (36.4 C) (Oral)   Resp 18   Ht 5' 10"  (1.778 m)   Wt 84.8 kg   SpO2 99%   BMI 26.83 kg/m  Gen:   Awake, no distress  afebrile Resp:  Normal effort CTA MSK:   Moves extremities without difficulty  Other:    Medical Decision Making  Medically screening exam initiated at 3:57 PM.  Appropriate orders placed.  YIFAN AUKER was informed that the remainder of the evaluation will be completed by another provider, this initial triage assessment does not replace that evaluation, and the importance of remaining in the ED until their evaluation is complete.     Melvenia Needles, PA-C 08/16/20 1609    Duffy Bruce, MD 08/16/20 Johnnye Lana

## 2020-08-16 NOTE — ED Triage Notes (Signed)
Pt here with hyperglycemia. Pt drove to ems station when he found out that his insulin pump malfunctioned. Pt did not feel well and wanted to be checked out. Pt also having nausea. 14m of zofran given by ems. Hx of DM and a thyroid issue.

## 2020-08-16 NOTE — H&P (Signed)
History and Physical    Ronald Banks KAJ:[id number] DOB: Mar 16, 1975 DOA: 08/16/2020  PCP: Ladell Pier, MD  Chief Complaint: Nausea , vomiting, fatigue, hyperglycemia  HPI: Ronald Banks is a 45 y.o. male with a past medical history of type 1 diabetes mellitus on insulin pump, hypothyroidism, tobacco dependence currently smoking 1 pack/day.  The patient presents to the emergency department after his insulin pump started malfunctioning yesterday.  Also having some nausea, vomiting and fatigue that started today.  Not participating much when I am asking  him questions.  Keeps his eyes closed.  Denies any fevers or chills.  Denies any urinary complaints.  Denies any diarrhea.  Denies chest pain.   ED Course: CXR, CBC, UA, BMP, lactic acid, VBG, beta hydroxybutyrate.  2 L of normal saline bolus, lactated Ringer's 125 cc an hour, insulin drip and Benadryl 25 mg IV x1.  Review of Systems: 14 point review of systems is negative except for what is mentioned above in the HPI.   Past Medical History:  Diagnosis Date   Diabetes mellitus without complication ([place])    type 1    Hyperlipidemia    Hypertensive retinopathy of both eyes 05/19/2019   [facility] Exam done 05/13/2019   Hypothyroidism    Moderate nonproliferative diabetic retinopathy of both eyes ([place]) 05/19/2019   Thyroid disease     Past Surgical History:  Procedure Laterality Date   DENTAL SURGERY     EAR CYST EXCISION Right 03/24/2020   Procedure: EXCISION PRE-AURICULAR CYST;  Surgeon: Carloyn Manner, MD;  Location: [place];  Service: ENT;  Laterality: Right;  diabetic insulin pump   PREAURICULAR CYST EXCISION Left 07/07/2020   Procedure: EXCISION PREAURICULAR CYST ADULT;  Surgeon: Carloyn Manner, MD;  Location: [place];  Service: ENT;  Laterality: Left;    Social History   Socioeconomic History   Marital status: Single    Spouse name: Not on file   Number of children: Not on file    Years of education: Not on file   Highest education level: Not on file  Occupational History   Not on file  Tobacco Use   Smoking status: Every Day    Packs/day: 1.00    Years: 30.00    Pack years: 30.00    Types: Cigarettes   Smokeless tobacco: Never  Vaping Use   Vaping Use: Never used  Substance and Sexual Activity   Alcohol use: Not Currently   Drug use: Yes    Types: Marijuana    Comment: Several times per week   Sexual activity: Yes  Other Topics Concern   Not on file  Social History Narrative   Right handed    Caffeine 2 cups per day    Lives at home with his mom    Social Determinants of Health   Financial Resource Strain: Not on file  Food Insecurity: Not on file  Transportation Needs: Not on file  Physical Activity: Not on file  Stress: Not on file  Social Connections: Not on file  Intimate Partner Violence: Not on file    No Known Allergies  Family History  Problem Relation Age of Onset   Diabetes Mother    Cancer Mother        Brain, breast    Diabetes Sister    Diabetes Maternal Grandmother     Prior to Admission medications   Medication Sig Start Date End Date Taking? Authorizing Provider  ACCU-CHEK FASTCLIX LANCETS MISC Use  to check blood sugar up to 5 times daily. 03/05/18   Fulp, Cammie, MD  ACCU-CHEK GUIDE test strip USE TO TEST BLOOD SUGAR UP TO 5 TIMES DAILY 02/17/19   Fulp, Cammie, MD  baclofen (LIORESAL) 10 MG tablet Take by mouth. 02/19/18   [provider]  Blood Glucose Monitoring Suppl (ACCU-CHEK GUIDE) w/Device KIT 1 kit by Does not apply route 5 (five) times daily. 03/05/18   Fulp, Cammie, MD  Continuous Blood Gluc Sensor (DEXCOM G6 SENSOR) MISC SMARTSIG:Topical Every 10 Days 06/02/20   [provider]  Continuous Blood Gluc Transmit (DEXCOM G6 TRANSMITTER) MISC Use to monitor blood sugar.  Replace every 3 months 03/24/20   [provider]  cyclobenzaprine (FLEXERIL) 5 MG tablet TAKE 1 TABLET BY MOUTH 2 TIMES  DAILY AS NEEDED FOR MUSCLE SPASMS. 03/19/20   Ladell Pier, MD  FLUoxetine (PROZAC) 10 MG capsule TAKE 1 CAPSULE BY MOUTH EVERY DAY 06/03/20   Ladell Pier, MD  fluticasone [facility]) 50 MCG/ACT nasal spray Place 2 sprays into both nostrils daily. 07/30/19   Argentina Donovan, PA-C  gabapentin (NEURONTIN) 600 MG tablet Take 1 tablet (600 mg total) by mouth 3 (three) times daily. 05/04/20   Ladell Pier, MD  Insulin Detemir (LEVEMIR FLEXTOUCH) 100 UNIT/ML Pen Inject into the skin. Patient not taking: Reported on 03/19/2020 04/18/18 04/18/19  [provider]  Insulin Human (INSULIN PUMP) SOLN Inject into the skin.    [provider]  INSULIN SYRINGE .5CC/29G (B-D INSULIN SYRINGE) 29G X 1/2" 0.5 ML MISC USE TO INJECT INSULIN EVERY DAY. 05/28/19   Fulp, Cammie, MD  levothyroxine (SYNTHROID, LEVOTHROID) 175 MCG tablet Take 1 tablet (175 mcg total) by mouth daily before breakfast. 02/11/18   Fulp, Cammie, MD  lidocaine (LIDODERM) 5 % Place 1 patch onto the skin daily. Remove & Discard patch within 12 hours or as directed by MD 05/12/20   Raulkar, Clide Deutscher, MD  meloxicam (MOBIC) 15 MG tablet Take 1 tablet by mouth daily. 05/29/20   [provider]  NOVOLOG FLEXPEN 100 UNIT/ML FlexPen  05/18/18   [provider]    Physical Exam: Vitals:   08/16/20 1240 08/16/20 1241 08/16/20 1735 08/16/20 2041  BP: (!) 143/63  132/71 (!) 135/56  Pulse: 82  95 92  Resp: _0 Temp: 97.6 F (36.4 C)  98.3 F (36.8 C)   TempSrc: Oral     SpO2: 99%  98% 100%  Weight:  84.8 kg    Height:  _1  (1.778 m)       General:  Appears calm and comfortable and is in NAD Cardiovascular:  RRR, no m/r/g.  Respiratory:   CTA bilaterally with no wheezes/rales/rhonchi.  Normal respiratory effort. Abdomen:  soft, NT, ND, NABS Skin:  no rash or induration seen on limited exam Musculoskeletal:  grossly normal tone BUE/BLE, good ROM, no bony abnormality Lower extremity:  No LE  edema.  Limited foot exam with no ulcerations.  2+ distal pulses. Psychiatric:  grossly normal mood and affect, speech fluent and appropriate, AOx3 Neurologic:  CN 2-12 grossly intact, moves all extremities in coordinated fashion, sensation intact    Radiological Exams on Admission: Independently reviewed - see discussion in A/P where applicable  DG Chest Portable 1 View  Result Date: 08/16/2020 CLINICAL DATA:  SOB, DKA, eval PNA.  Hyperglycemia EXAM: PORTABLE CHEST 1 VIEW COMPARISON:  07/13/2019, 04/07/2019 FINDINGS: The heart size and mediastinal contours are within normal limits. Slightly  prominent interstitial markings are similar in appearance to the previous films. No focal airspace consolidation, pleural effusion, or pneumothorax. The visualized skeletal structures are unremarkable. IMPRESSION: No active disease. Electronically Signed   By: Davina Poke D.O.   On: 08/16/2020 20:35    EKG: Independently reviewed.  NSR with rate 83.   Labs on Admission: I have personally reviewed the available labs and imaging studies at the time of the admission.  Pertinent labs: Beta hydroxybutyrate 7.83, VBG: pH 7.21, PCO2 37, PO2 68, bicarb 14.8.  Latest BMP: Sodium 129, potassium 5.1, chloride 93, CO2 16, blood glucose 378, BUN 21, creatinine 1.30, anion gap 20.     Assessment/Plan: DKA in the setting of type 1 diabetes mellitus: The patient will be admitted to the stepdown unit under inpatient status with cardiac monitoring.  Anion gap remains open.  Continue insulin drip.  BMP every 4 hours to monitor for gap closure and monitor serum potassium.  Accu-Cheks every 1 hour to titrate insulin drip.  Obtain A1c.  Once blood sugars are less than 250 we will switch to D5 fluids.  He is not encephalopathic.  Diabetes coordinator consulted.  Leukocytosis likely reactive: No signs of infection.  Hypothyroidism: Continue home Synthroid 175 mcg daily.  Obtain TSH.  Diabetic neuropathy: Continue home  gabapentin 600 mg 3 times daily.  Tobacco dependence: Counseled on tobacco cessation.  Nicotine patch 21 mg daily.  Level of Care: Stepdown unit DVT prophylaxis: Lovenox subcu Code Status: Full code Consults: Diabetes coordinator Admission status: Inpatient   Leslee Home DO [facility]   How to contact the [facility] Attending or Consulting provider Real or covering provider during after hours [place], for this patient?  Check the care team in [facility] and look for a) attending/consulting [facility] provider listed and b) the [facility] team listed Log into [email or web address] and use [place]'s universal password to access. If you do not have the password, please contact the hospital operator. Locate the [facility] provider you are looking for under [facility] and page to a number that you can be directly reached. If you still have difficulty reaching the provider, please page the [facility] (Director on Call) for the Hospitalists listed on amion for assistance.   08/16/2020, 9:39 PM

## 2020-08-17 DIAGNOSIS — E1110 Type 2 diabetes mellitus with ketoacidosis without coma: Secondary | ICD-10-CM

## 2020-08-17 DIAGNOSIS — E1010 Type 1 diabetes mellitus with ketoacidosis without coma: Secondary | ICD-10-CM | POA: Diagnosis not present

## 2020-08-17 HISTORY — DX: Type 2 diabetes mellitus with ketoacidosis without coma: E11.10

## 2020-08-17 LAB — BASIC METABOLIC PANEL
Anion gap: 7 (ref 5–15)
BUN: 17 mg/dL (ref 6–20)
CO2: 24 mmol/L (ref 22–32)
Calcium: 8.4 mg/dL — ABNORMAL LOW (ref 8.9–10.3)
Chloride: 103 mmol/L (ref 98–111)
Creatinine, Ser: 1.09 mg/dL (ref 0.61–1.24)
GFR, Estimated: >60 mL/min (ref >60)
Glucose, Bld: 83 mg/dL (ref 70–99)
Potassium: 4 mmol/L (ref 3.5–5.1)
Sodium: 134 mmol/L — ABNORMAL LOW (ref 135–145)

## 2020-08-17 LAB — CBG MONITORING, ED
Glucose-Capillary: 166 mg/dL — ABNORMAL HIGH (ref 70–99)
Glucose-Capillary: 186 mg/dL — ABNORMAL HIGH (ref 70–99)
Glucose-Capillary: 147 mg/dL — ABNORMAL HIGH (ref 70–99)
Glucose-Capillary: 133 mg/dL — ABNORMAL HIGH (ref 70–99)
Glucose-Capillary: 112 mg/dL — ABNORMAL HIGH (ref 70–99)
Glucose-Capillary: 96 mg/dL (ref 70–99)
Glucose-Capillary: 87 mg/dL (ref 70–99)

## 2020-08-17 LAB — TSH: TSH: 10.242 u[IU]/mL — ABNORMAL HIGH (ref 0.350–4.500)

## 2020-08-17 LAB — RESP PANEL BY RT-PCR (FLU A&B, COVID) ARPGX2
Influenza A by PCR: NEGATIVE
Influenza B by PCR: NEGATIVE
SARS Coronavirus 2 by RT PCR: NEGATIVE

## 2020-08-17 LAB — BETA-HYDROXYBUTYRIC ACID: Beta-Hydroxybutyric Acid: 0.21 mmol/L (ref 0.05–0.27)

## 2020-08-17 LAB — LACTIC ACID, PLASMA: Lactic Acid, Venous: 1.1 mmol/L (ref 0.5–1.9)

## 2020-08-17 MED ORDER — INSULIN DETEMIR 100 UNIT/ML ~~LOC~~ SOLN
0.3000 [IU]/kg | SUBCUTANEOUS | Status: DC
  Administered 2020-08-17: 25 [IU] via SUBCUTANEOUS
  Filled 2020-08-17: qty 0.25

## 2020-08-17 MED ORDER — INSULIN ASPART 100 UNIT/ML IJ SOLN: 0.0000 [IU] | Freq: Three times a day (TID) | INTRAMUSCULAR | Status: DC

## 2020-08-17 MED ORDER — INSULIN ASPART 100 UNIT/ML IJ SOLN: 0.0000 [IU] | Freq: Every day | INTRAMUSCULAR | Status: DC

## 2020-08-17 NOTE — Discharge Summary (Signed)
Ronald Banks [id number] DOB: Jun 11, 1975 DOA: 08/16/2020  PCP: Ladell Pier, MD  Admit date: 08/16/2020 Discharge date: 08/17/2020  Time spent: 35 minutes  Recommendations for Outpatient Follow-up:  Close pcp or endocrinology f/u     Discharge Diagnoses:  Active Problems:   DKA, type 1 ([place])   DKA (diabetic ketoacidosis) ([place])   Discharge Condition: stable  Diet recommendation: carb modified  Filed Weights   08/16/20 1241  Weight: 84.8 kg    History of present illness:  Ronald Banks is a 45 y.o. male with a past medical history of type 1 diabetes mellitus on insulin pump, hypothyroidism, tobacco dependence currently smoking 1 pack/day.  The patient presents to the emergency department after his insulin pump started malfunctioning yesterday.  Also having some nausea, vomiting and fatigue that started today.  Not participating much when I am asking  him questions.  Keeps his eyes closed.  Denies any fevers or chills.  Denies any urinary complaints.  Denies any diarrhea.  Denies chest pain.  Hospital Course:  Hx t1dm, presented with dka secondary to pump malfunction (kink in hose developed, pt didn't realize until dka set in). Here treated with insulin gtt and fluids. Symptoms resolved, on hospital day one he says "I feel 100% better." Gap closed, bicarb wnl, electrolytes wnl, beta hydroxybutyric acid normal. Has replacement supplies for his malfunctioning tube on insulin pump. Discharge with close pcp or endocrine f/u  Procedures: none   Consultations: none  Discharge Exam: Vitals:   08/17/20 0700 08/17/20 0730  BP: 117/60 (!) 119/57  Pulse: (!) 57 65  Resp: 14 15  Temp:    SpO2: 99% 99%    General: NAD Cardiovascular: RRR, no murmur Respiratory: CTAB Abdomen: soft, non-tender  Discharge Instructions   Discharge Instructions     Diet Carb Modified   Complete by: As directed    Increase activity slowly   Complete by: As directed       Allergies  as of 08/17/2020   No Known Allergies      Medication List     TAKE these medications    Accu-Chek FastClix Lancets Misc Use to check blood sugar up to 5 times daily.   Accu-Chek Guide test strip Generic drug: glucose blood USE TO TEST BLOOD SUGAR UP TO 5 TIMES DAILY   Accu-Chek Guide w/Device Kit 1 kit by Does not apply route 5 (five) times daily.   baclofen 10 MG tablet Commonly known as: LIORESAL Take 10 mg by mouth 3 (three) times daily as needed.   cyclobenzaprine 5 MG tablet Commonly known as: FLEXERIL TAKE 1 TABLET BY MOUTH 2 TIMES DAILY AS NEEDED FOR MUSCLE SPASMS.   Dexcom G6 Sensor Misc SMARTSIG:Topical Every 10 Days   Dexcom G6 Transmitter Misc Use to monitor blood sugar.  Replace every 3 months   FLUoxetine 10 MG capsule Commonly known as: PROZAC TAKE 1 CAPSULE BY MOUTH EVERY DAY   fluticasone 50 MCG/ACT nasal spray Commonly known as: FLONASE Place 2 sprays into both nostrils daily.   gabapentin 600 MG tablet Commonly known as: Neurontin Take 1 tablet (600 mg total) by mouth 3 (three) times daily.   insulin aspart 100 UNIT/ML injection Commonly known as: novoLOG Up to 100 units daily in pump   insulin pump Soln Inject into the skin.   INSULIN SYRINGE .5CC/29G 29G X 1/2" 0.5 ML Misc Commonly known as: B-D INSULIN SYRINGE USE TO INJECT INSULIN EVERY DAY.   Levemir FlexTouch 100 UNIT/ML FlexPen Generic  drug: insulin detemir Inject into the skin.   levothyroxine 125 MCG tablet Commonly known as: SYNTHROID Take 250 mcg by mouth See admin instructions. Take 2 tablets daily Monday through Saturday. What changed: Another medication with the same name was removed. Continue taking this medication, and follow the directions you see here.   lidocaine 5 % Commonly known as: Lidoderm Place 1 patch onto the skin daily. Remove & Discard patch within 12 hours or as directed by MD   meloxicam 15 MG tablet Commonly known as: MOBIC Take 1 tablet by mouth  daily.       No Known Allergies  Follow-up Information     Your endocrinologist or primary care provider Follow up.   Why: within 1 week                 The results of significant diagnostics from this hospitalization (including imaging, microbiology, ancillary and laboratory) are listed below for reference.    Significant Diagnostic Studies: DG Chest Portable 1 View  Result Date: 08/16/2020 CLINICAL DATA:  SOB, DKA, eval PNA.  Hyperglycemia EXAM: PORTABLE CHEST 1 VIEW COMPARISON:  07/13/2019, 04/07/2019 FINDINGS: The heart size and mediastinal contours are within normal limits. Slightly prominent interstitial markings are similar in appearance to the previous films. No focal airspace consolidation, pleural effusion, or pneumothorax. The visualized skeletal structures are unremarkable. IMPRESSION: No active disease. Electronically Signed   By: Davina Poke D.O.   On: 08/16/2020 20:35    Microbiology: No results found for this or any previous visit (from the past 240 hour(s)).   Labs: Basic Metabolic Panel: Recent Labs  Lab 08/16/20 1248 08/16/20 1923 08/17/20 0639  NA 130* 129* 134*  K 5.9* 5.1 4.0  CL 94* 93* 103  CO2 22 16* 24  GLUCOSE 354* 378* 83  BUN 17 21* 17  CREATININE 1.22 1.30* 1.09  CALCIUM 8.9 9.2 8.4*   Liver Function Tests: No results for input(s): AST, ALT, ALKPHOS, BILITOT, PROT, ALBUMIN in the last 168 hours. No results for input(s): LIPASE, AMYLASE in the last 168 hours. No results for input(s): AMMONIA in the last 168 hours. CBC: Recent Labs  Lab 08/16/20 1248  WBC 24.6*  HGB 16.1  HCT 46.2  MCV 93.1  PLT 272   Cardiac Enzymes: No results for input(s): CKTOTAL, CKMB, CKMBINDEX, TROPONINI in the last 168 hours. BNP: BNP (last 3 results) No results for input(s): BNP in the last 8760 hours.  ProBNP (last 3 results) No results for input(s): PROBNP in the last 8760 hours.  CBG: Recent Labs  Lab 08/17/20 0231 08/17/20 0338  08/17/20 0446 08/17/20 0621 08/17/20 0747  GLUCAP 147* 133* 112* 96 87       Signed:  Desma Maxim MD.  [facility] 08/17/2020, 8:20 AM

## 2020-08-17 NOTE — ED Notes (Signed)
Mother phone # [phone]

## 2020-08-18 LAB — HEMOGLOBIN A1C
Hgb A1c MFr Bld: 6.8 % — ABNORMAL HIGH (ref 4.8–5.6)
Mean Plasma Glucose: 148 mg/dL

## 2020-09-03 ENCOUNTER — Ambulatory Visit: Payer: Medicaid Other | Attending: Internal Medicine | Admitting: Internal Medicine

## 2020-09-03 ENCOUNTER — Other Ambulatory Visit: Payer: Self-pay

## 2020-09-03 ENCOUNTER — Encounter: Payer: Self-pay | Admitting: Internal Medicine

## 2020-09-03 VITALS — BP 116/61 | HR 58 | Ht 70.0 in | Wt 190.8 lb

## 2020-09-03 DIAGNOSIS — Z79899 Other long term (current) drug therapy: Secondary | ICD-10-CM | POA: Insufficient documentation

## 2020-09-03 DIAGNOSIS — Z794 Long term (current) use of insulin: Secondary | ICD-10-CM | POA: Insufficient documentation

## 2020-09-03 DIAGNOSIS — M545 Low back pain, unspecified: Secondary | ICD-10-CM

## 2020-09-03 DIAGNOSIS — F17210 Nicotine dependence, cigarettes, uncomplicated: Secondary | ICD-10-CM | POA: Insufficient documentation

## 2020-09-03 DIAGNOSIS — I10 Essential (primary) hypertension: Secondary | ICD-10-CM | POA: Insufficient documentation

## 2020-09-03 DIAGNOSIS — E785 Hyperlipidemia, unspecified: Secondary | ICD-10-CM | POA: Diagnosis not present

## 2020-09-03 DIAGNOSIS — Z833 Family history of diabetes mellitus: Secondary | ICD-10-CM | POA: Insufficient documentation

## 2020-09-03 DIAGNOSIS — R413 Other amnesia: Secondary | ICD-10-CM | POA: Diagnosis not present

## 2020-09-03 DIAGNOSIS — G894 Chronic pain syndrome: Secondary | ICD-10-CM | POA: Diagnosis not present

## 2020-09-03 DIAGNOSIS — Z9641 Presence of insulin pump (external) (internal): Secondary | ICD-10-CM | POA: Diagnosis not present

## 2020-09-03 DIAGNOSIS — F17200 Nicotine dependence, unspecified, uncomplicated: Secondary | ICD-10-CM

## 2020-09-03 DIAGNOSIS — E1042 Type 1 diabetes mellitus with diabetic polyneuropathy: Secondary | ICD-10-CM | POA: Insufficient documentation

## 2020-09-03 DIAGNOSIS — Z2821 Immunization not carried out because of patient refusal: Secondary | ICD-10-CM

## 2020-09-03 DIAGNOSIS — Z79890 Hormone replacement therapy (postmenopausal): Secondary | ICD-10-CM | POA: Diagnosis not present

## 2020-09-03 DIAGNOSIS — E039 Hypothyroidism, unspecified: Secondary | ICD-10-CM | POA: Insufficient documentation

## 2020-09-03 DIAGNOSIS — G8929 Other chronic pain: Secondary | ICD-10-CM

## 2020-09-03 MED ORDER — CYCLOBENZAPRINE HCL 5 MG PO TABS: ORAL_TABLET | ORAL | 3 refills | Status: DC

## 2020-09-03 MED ORDER — ACCU-CHEK GUIDE VI STRP: ORAL_STRIP | 11 refills | Status: DC

## 2020-09-03 NOTE — Patient Instructions (Signed)
[phone] 

## 2020-09-03 NOTE — Progress Notes (Signed)
Patient ID: Ronald Banks, male    DOB: 03/28/1975  MRN: [id number]  CC: chronic ds management and LBP   Subjective: Ronald Banks is a 45 y.o. male who presents for routine f/u His concerns today include:  Patient with history of diabetes type 1 (insulin pump) with moderate NPDR and neuropathy, HL, HTN, hypothyroid, UACS (Dr. Melvyn Novas - on Gabapentin), emphysema on CT 08/2019,tob dep, MDD, memory issues  Hosp last mth for one day with DKA due to malfunctioning of his insulin pump . He uses NovoLog in his pump.  Also uses Levemir 5 units SQ PRN.  Does not have to take every day BS have been good.  No significant low readings Has upcoming appt with his endocrinologist he thinks next mth Memory issues  LBP:  saw Dr. Ranell Patrick since last visit with me.  Her assessment is that he has chronic pain syndrome secondary to L4-L5 disc bulge.  She discussed benefits of exercise and reducing pain.  She looked over his MRI report.  She started him on meloxicam and Lidoderm patches.  Patient tells me he has not found those to helpful.  He is requesting refill on Flexeril which he states is helpful.  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  He does a lot of lifting and climbing  Tob dep: down to 1/2 pk a day.  States he is slowing down and will quit eventually  Memory difficulties: He was referred to neurology on last visit.  Saw Dr. Jannifer Franklin.  MRI of the head ordered.  However patient states he was never called with an appointment date for the MRI.  He has also been referred for neuropsychiatric testing.  That appointment is later this month.  HM:  due for eye exam.  Had negative Cologuard 04/2020.  Declines Prevnar 20 Lab Results  Component Value Date   HGBA1C 6.8 (H) 08/17/2020     Patient Active Problem List   Diagnosis Date Noted   DKA (diabetic ketoacidosis) ([place]) 08/17/2020   DKA, type 1 ([place]) 08/16/2020   Hyperlipidemia due to type 1 diabetes mellitus ([place]) 05/04/2020   Unexplained weight  loss 11/20/2019   Tobacco dependence 10/09/2019   Major depression, recurrent, chronic ([place]) 10/09/2019   Memory difficulty 10/09/2019   Hyperlipidemia associated with type 2 diabetes mellitus ([place]) 10/09/2019   Moderate nonproliferative diabetic retinopathy of both eyes ([place]) 05/19/2019   Hypertensive retinopathy of both eyes 05/19/2019   DOE (dyspnea on exertion) 05/19/2019   Upper airway cough syndrome 04/07/2019   Essential hypertension 04/07/2019   DM type 1 with diabetic peripheral neuropathy ([place]) 02/11/2018   Hyperlipidemia LDL goal <70 02/11/2018   Hypothyroidism 02/11/2018   Chronic right shoulder pain 02/11/2018   Chronic midline low back pain with right-sided sciatica 02/11/2018   Anxiety 02/11/2018     Current Outpatient Medications on File Prior to Visit  Medication Sig Dispense Refill   ACCU-CHEK FASTCLIX LANCETS MISC Use to check blood sugar up to 5 times daily. 102 each 11   ACCU-CHEK GUIDE test strip USE TO TEST BLOOD SUGAR UP TO 5 TIMES DAILY 100 strip 11   baclofen (LIORESAL) 10 MG tablet Take 10 mg by mouth 3 (three) times daily as needed.     Blood Glucose Monitoring Suppl (ACCU-CHEK GUIDE) w/Device KIT 1 kit by Does not apply route 5 (five) times daily. 1 kit 0   Continuous Blood Gluc Sensor (DEXCOM G6 SENSOR) MISC SMARTSIG:Topical Every 10 Days     Continuous  Blood Gluc Transmit (DEXCOM G6 TRANSMITTER) MISC Use to monitor blood sugar.  Replace every 3 months     cyclobenzaprine (FLEXERIL) 5 MG tablet TAKE 1 TABLET BY MOUTH 2 TIMES DAILY AS NEEDED FOR MUSCLE SPASMS. 30 tablet 0   FLUoxetine (PROZAC) 10 MG capsule TAKE 1 CAPSULE BY MOUTH EVERY DAY 90 capsule 1   fluticasone (FLONASE) 50 MCG/ACT nasal spray Place 2 sprays into both nostrils daily. 16 g 6   gabapentin (NEURONTIN) 600 MG tablet Take 1 tablet (600 mg total) by mouth 3 (three) times daily. 90 tablet 3   insulin aspart (NOVOLOG) 100 UNIT/ML injection Up to 100 units daily in pump     Insulin Human  (INSULIN PUMP) SOLN Inject into the skin.     INSULIN SYRINGE .5CC/29G (B-D INSULIN SYRINGE) 29G X 1/2" 0.5 ML MISC USE TO INJECT INSULIN EVERY DAY. 100 each 2   levothyroxine (SYNTHROID) 125 MCG tablet Take 250 mcg by mouth See admin instructions. Take 2 tablets daily Monday through Saturday.     lidocaine (LIDODERM) 5 % Place 1 patch onto the skin daily. Remove & Discard patch within 12 hours or as directed by MD 30 patch 0   meloxicam (MOBIC) 15 MG tablet Take 1 tablet by mouth daily.     Insulin Detemir (LEVEMIR FLEXTOUCH) 100 UNIT/ML Pen Inject into the skin.     No current facility-administered medications on file prior to visit.    No Known Allergies  Social History   Socioeconomic History   Marital status: Single    Spouse name: Not on file   Number of children: Not on file   Years of education: Not on file   Highest education level: Not on file  Occupational History   Not on file  Tobacco Use   Smoking status: Every Day    Packs/day: 1.00    Years: 30.00    Pack years: 30.00    Types: Cigarettes   Smokeless tobacco: Never  Vaping Use   Vaping Use: Never used  Substance and Sexual Activity   Alcohol use: Not Currently   Drug use: Yes    Types: Marijuana    Comment: Several times per week   Sexual activity: Yes  Other Topics Concern   Not on file  Social History Narrative   Right handed    Caffeine 2 cups per day    Lives at home with his mom    Social Determinants of Health   Financial Resource Strain: Not on file  Food Insecurity: Not on file  Transportation Needs: Not on file  Physical Activity: Not on file  Stress: Not on file  Social Connections: Not on file  Intimate Partner Violence: Not on file    Family History  Problem Relation Age of Onset   Diabetes Mother    Cancer Mother        Brain, breast    Diabetes Sister    Diabetes Maternal Grandmother     Past Surgical History:  Procedure Laterality Date   DENTAL SURGERY     EAR CYST  EXCISION Right 03/24/2020   Procedure: EXCISION PRE-AURICULAR CYST;  Surgeon: Carloyn Manner, MD;  Location: [place] [facility];  Service: ENT;  Laterality: Right;  diabetic insulin pump   PREAURICULAR CYST EXCISION Left 07/07/2020   Procedure: EXCISION PREAURICULAR CYST ADULT;  Surgeon: Carloyn Manner, MD;  Location: [place];  Service: ENT;  Laterality: Left;    ROS: Review of Systems Negative except as stated above  PHYSICAL EXAM: BP 116/61   Pulse (!) 58   Ht _0  (1.778 m)   Wt 190 lb 12.8 oz (86.5 kg)   SpO2 98%   BMI 27.38 kg/m   Wt Readings from Last 3 Encounters:  09/03/20 190 lb 12.8 oz (86.5 kg)  08/16/20 187 lb (84.8 kg)  07/07/20 192 lb 9.6 oz (87.4 kg)    Physical Exam  General appearance - alert, well appearing, middle-age Caucasian male and in no distress Mental status - normal mood, behavior, speech, dress, motor activity, and thought processes Neck - supple, no significant adenopathy Chest - clear to auscultation, no wheezes, rales or rhonchi, symmetric air entry Heart - normal rate, regular rhythm, normal S1, S2, no murmurs, rubs, clicks or gallops Extremities - peripheral pulses normal, no pedal edema, no clubbing or cyanosis   CMP Latest Ref Rng & Units 08/17/2020 08/16/2020 08/16/2020  Glucose 70 - 99 mg/dL 83 378(H) 354(H)  BUN 6 - 20 mg/dL 17 21(H) 17  Creatinine 0.61 - 1.24 mg/dL 1.09 1.30(H) 1.22  Sodium 135 - 145 mmol/L 134(L) 129(L) 130(L)  Potassium 3.5 - 5.1 mmol/L 4.0 5.1 5.9(H)  Chloride 98 - 111 mmol/L 103 93(L) 94(L)  CO2 22 - 32 mmol/L 24 16(L) 22  Calcium 8.9 - 10.3 mg/dL 8.4(L) 9.2 8.9  Total Protein 6.0 - 8.5 g/dL - - -  Total Bilirubin 0.0 - 1.2 mg/dL - - -  Alkaline Phos 44 - 121 IU/L - - -  AST 0 - 40 IU/L - - -  ALT 0 - 44 IU/L - - -   Lipid Panel     Component Value Date/Time   CHOL 226 (H) 09/11/2018 0954   TRIG 89 09/11/2018 0954   HDL 65 09/11/2018 0954   CHOLHDL 3.5 09/11/2018 0954   LDLCALC 143 (H)  09/11/2018 0954    CBC    Component Value Date/Time   WBC 24.6 (H) 08/16/2020 1248   RBC 4.96 08/16/2020 1248   HGB 16.1 08/16/2020 1248   HGB 16.1 11/20/2019 1048   HCT 46.2 08/16/2020 1248   HCT 47.2 11/20/2019 1048   PLT 272 08/16/2020 1248   PLT 368 11/20/2019 1048   MCV 93.1 08/16/2020 1248   MCV 93 11/20/2019 1048   MCH 32.5 08/16/2020 1248   MCHC 34.8 08/16/2020 1248   RDW 12.7 08/16/2020 1248   RDW 12.1 11/20/2019 1048   LYMPHSABS 2.7 05/19/2019 1528   LYMPHSABS 2.5 09/11/2018 0954   MONOABS 0.5 05/19/2019 1528   EOSABS 0.1 05/19/2019 1528   EOSABS 0.2 09/11/2018 0954   BASOSABS 0.1 05/19/2019 1528   BASOSABS 0.1 09/11/2018 0954   Lab Results  Component Value Date   HGBA1C 6.8 (H) 08/17/2020    ASSESSMENT AND PLAN: 1. DM type 1 with diabetic peripheral neuropathy (HCC) At goal.  Patient continues with his insulin pump which has been functioning normally since hospital discharge.  Continue healthy eating habits and stay active.  Keep upcoming appointment with his endocrinologist. - glucose blood (ACCU-CHEK GUIDE) test strip; USE TO TEST BLOOD SUGAR UP TO 5 TIMES DAILY  Dispense: 100 strip; Refill: 11 - Ambulatory referral to Ophthalmology  2. Chronic midline low back pain without sciatica Type of work that he does undoubtably contributes.  Encouraged him to use the Lidoderm patches as needed.  Refill given on Flexeril. - cyclobenzaprine (FLEXERIL) 5 MG tablet; TAKE 1 TABLET BY MOUTH 2 TIMES DAILY AS NEEDED FOR MUSCLE SPASMS.  Dispense: 30 tablet; Refill: 3  3. Memory difficulty Keep upcoming appointment for neuropsychiatric evaluation.  I have given him the phone number for the neurologist office so that he can call and have them give him a date for the MRI of the head that they ordered.  4. Pneumococcal vaccination declined   5. Tobacco dependence Continue to encourage him to try to quit smoking.  He is aware of the risk factors associated with  smoking.    Patient was given the opportunity to ask questions.  Patient verbalized understanding of the plan and was able to repeat key elements of the plan.   No orders of the defined types were placed in this encounter.    Requested Prescriptions    No prescriptions requested or ordered in this encounter    No follow-ups on file.  Karle Plumber, MD, FACP

## 2020-09-03 NOTE — Progress Notes (Signed)
Needs refill on Flexeril.

## 2020-09-16 ENCOUNTER — Encounter: Payer: Self-pay | Admitting: Psychology

## 2020-09-16 ENCOUNTER — Ambulatory Visit: Payer: Medicaid Other | Admitting: Psychology

## 2020-09-16 ENCOUNTER — Other Ambulatory Visit: Payer: Self-pay

## 2020-09-16 DIAGNOSIS — R41840 Attention and concentration deficit: Secondary | ICD-10-CM

## 2020-09-16 DIAGNOSIS — F331 Major depressive disorder, recurrent, moderate: Secondary | ICD-10-CM

## 2020-09-16 DIAGNOSIS — F900 Attention-deficit hyperactivity disorder, predominantly inattentive type: Secondary | ICD-10-CM | POA: Insufficient documentation

## 2020-09-16 NOTE — Progress Notes (Signed)
NEUROPSYCHOLOGICAL EVALUATION [place] Department of Neurology  Date of Evaluation: September 16, 2020  Reason for Referral:   Ronald Banks is a 45 y.o. right-handed Caucasian male referred by  Margette Fast, M.D. , to characterize his current cognitive functioning and assist with diagnostic clarity and treatment planning in the context of subjective cognitive decline.   Assessment and Plan:   Clinical Impression(s): Scores across stand-alone and embedded performance validity measures were variable and there were concerns surrounding diminished persistence surrounding memory tasks (i.e., Ronald Banks would almost immediately give up, stating that he could not remember any words). As such, the results of the current evaluation should be interpreted with caution and lower scores may underestimate true abilities to an unknown extent.   If taken at face value, Ronald Banks pattern of performance is suggestive of a primary weakness surrounding encoding (i.e., learning) and retrieval aspects of memory. He also exhibited performance variability across processing speed, attention/concentration, and executive functioning. Performances were appropriate across receptive language, expressive language, visuospatial abilities, and retention percentages across memory measures. Ronald Banks largely denied difficulties completing instrumental activities of daily living (ADLs) independently.   Across mood-related questionnaires, Ronald Banks reported moderate symptoms of depression. Regarding concerns surrounding ADHD, Ronald Banks scored in the "highly likely to have ADHD" range across symptoms of inattention and in the "likely to have ADHD" range across symptoms of hyperactivity/impulsivity on a retroactive childhood symptom questionnaire. Across a more comprehensive ADHD-related questionnaire, he significantly elevated subscales surrounding inattention and overall symptoms consistent with ADHD.  Scores across these questionnaires, in addition to his reporting that he has had lifelong deficits with sustained attention and distractibility which have impacted numerous psychosocial domains in his life, suggest that he would meet diagnostic criteria for ADHD.  Overall, the most likely culprit for ongoing cognitive dysfunction is an underlying and currently untreated ADHD presentation, exacerbated by psychiatric distress (e.g., depression), various medical conditions (e.g., type I diabetes, hypertension, hyperlipidemia, hypothyroidism), and regular marijuana consumption. Ronald Banks is far too young to strongly consider the presence of a neurodegenerative process and I do not see evidence for any other potential neurological cause for reported symptoms or patterns across neurocognitive testing.   Recommendations: If desired, Ronald Banks should speak to his PCP regarding psychostimulant medications to treat symptoms of ADHD. Should these symptoms become better managed, he should start to see an improvement not only in cognitive functioning, but also day-to-day functioning as well. I would also recommend that he speak to his prescribing physician about possible medication dosing adjustments to optimally manage ongoing symptoms of depression. However, it may be that Ronald Banks simply must take his current medication consistently and that his current dosing is appropriate.   I would strongly encourage him to purchase a pillbox and utilize regular alarms/reminders on his phone or other devices to help structure his taking of various medications. He does not appear to have any current structure in place, which is likely contributing to him forgetting to take medications from time to time.   A brain MRI had been ordered by Dr. Jannifer Franklin but it does not appear that Ronald Banks had scheduled this procedure. I would encourage him to do so. This would allow more confidence in the belief that symptoms are not related to an  underlying neurological abnormality.   Ronald Banks is encouraged to attend to lifestyle factors for brain health (e.g., regular physical exercise, good nutrition habits, regular participation in cognitively-stimulating activities, and Banks stress management  techniques), which are likely to have benefits for both emotional adjustment and cognition. In fact, in addition to promoting good Banks health, regular exercise incorporating aerobic activities (e.g., brisk walking, jogging, cycling, etc.) has been demonstrated to be a very effective treatment for depression and stress, with similar efficacy rates to both antidepressant medication and psychotherapy. Optimal control of vascular risk factors (including safe cardiovascular exercise and adherence to dietary recommendations) is encouraged.   When learning new information, he would benefit from information being broken up into small, manageable pieces. He may also find it helpful to articulate the material in his own words and in a context to promote encoding at the onset of a new task. This material may need to be repeated multiple times to promote encoding.  Memory can be improved using internal strategies such as rehearsal, repetition, chunking, mnemonics, association, and imagery. External strategies such as written notes in a consistently used memory journal, visual and nonverbal auditory cues such as a calendar on the refrigerator or appointments with alarm, such as on a cell phone, can also help maximize recall.    To address problems with processing speed, he may wish to consider:   -Ensuring that he is alerted when essential material or instructions are being presented   -Adjusting the speed at which new information is presented   -Allowing for more time in comprehending, processing, and responding in conversation  To address problems with fluctuating attention, he may wish to consider:   -Avoiding external distractions when needing to  concentrate   -Limiting exposure to fast paced environments with multiple sensory demands   -Writing down complicated information and using checklists   -Attempting and completing one task at a time (i.e., no multi-tasking)   -Verbalizing aloud each step of a task to maintain focus   -Reducing the amount of information considered at one time  Review of Records:   Ronald Banks was seen by [facility] Margette Fast, M.D.) on 06/30/2020 for an evaluation of memory loss. At that time, Ronald Banks reported memory concerns which have been gradually worsening over the past 2-3 years. Examples included trouble forgetting to take his medications and that he will misplace things frequently. He reported having trouble maintaining gainful employment due to cognitive difficulties. He reportedly lives with his mother who manages his finances. This was said to be longstanding in nature. No driving related concerns were reported outside of occasionally forgetting where he is going. There is a history of left-sided back pain and sciatica down the left leg. He reported sleeping well at night and has a good energy levels during the day. There is a history of depression which he said was moderately well controlled currently. He denied any focal numbness or weakness of the face, arms, or legs. He also denied any balance issues or falls, issues controlling the bowels or the bladder, ongoing headaches, or dizziness. He has had occasional episodes of low blood sugar and has a subcutaneous monitoring system that will warn him when his levels are dropping. Performance on a brief cognitive screening instrument (MMSE) was 27/30. Ultimately, Ronald Banks was referred for a comprehensive neuropsychological evaluation to characterize his cognitive abilities and to assist with diagnostic clarity and treatment planning.   A brain MRI had been ordered but not yet completed at the time of the current evaluation. No neuroimaging was  available for review.   Past Medical History:  Diagnosis Date   ADHD (attention deficit hyperactivity disorder), inattentive type    Chronic  midline low back pain with right-sided sciatica 02/11/2018   Chronic right shoulder pain 02/11/2018   DKA (diabetic ketoacidosis) 08/17/2020   DOE (dyspnea on exertion) 05/19/2019   Onset 2015/16 05/19/2019   Walked RA x two laps =  approx 569f @ brisk pace - stopped due to end of study with sats of 98 % at the end of the study and c/o sob s cp  > rec CPST next  - alpha one AT  Screen   05/19/19 MM  152  - CPST  06/30/19  FEV1 3.79 (93%) with ratio 0.73 and nl f/v loop: ex study  probabably  wnl when wt factored  In  - sob with ventilatory reserve suggested sub max effort and de   Hyperlipidemia due to type 1 diabetes mellitus 04/18/2018   Hypertension associated with diabetes 04/18/2018   Trial off acei 04/07/2019 due to cough > improved by 07/03/2019 with just a typical smoker's rattle (no longer cough to point of gag/vomit)   Hypothyroidism    Major depressive disorder 10/09/2019   Moderate nonproliferative diabetic retinopathy of both eyes ([place] 05/19/2019   Tobacco dependence 10/09/2019   Type 1 diabetes mellitus with diabetic polyneuropathy 04/18/2018    Past Surgical History:  Procedure Laterality Date   DENTAL SURGERY     EAR CYST EXCISION Right 03/24/2020   Procedure: EXCISION PRE-AURICULAR CYST;  Surgeon: VCarloyn Manner MD;  Location: [facility]  Service: ENT;  Laterality: Right;  diabetic insulin pump   PREAURICULAR CYST EXCISION Left 07/07/2020   Procedure: EXCISION PREAURICULAR CYST ADULT;  Surgeon: VCarloyn Manner MD;  Location: [facility]  Service: ENT;  Laterality: Left;    Current Outpatient Medications:    ACCU-CHEK FASTCLIX LANCETS MISC, Use to check blood sugar up to 5 times daily., Disp: 102 each, Rfl: 11   Blood Glucose Monitoring Suppl (ACCU-CHEK GUIDE) w/Device KIT, 1 kit by Does not apply route 5 (five)  times daily., Disp: 1 kit, Rfl: 0   Continuous Blood Gluc Sensor (DEXCOM G6 SENSOR) MISC, SMARTSIG:Topical Every 10 Days, Disp: , Rfl:    Continuous Blood Gluc Transmit (DEXCOM G6 TRANSMITTER) MISC, Use to monitor blood sugar.  Replace every 3 months, Disp: , Rfl:    cyclobenzaprine (FLEXERIL) 5 MG tablet, TAKE 1 TABLET BY MOUTH 2 TIMES DAILY AS NEEDED FOR MUSCLE SPASMS., Disp: 30 tablet, Rfl: 3   FLUoxetine (PROZAC) 10 MG capsule, TAKE 1 CAPSULE BY MOUTH EVERY DAY, Disp: 90 capsule, Rfl: 1   fluticasone (FLONASE) 50 MCG/ACT nasal spray, Place 2 sprays into both nostrils daily., Disp: 16 g, Rfl: 6   gabapentin (NEURONTIN) 600 MG tablet, Take 1 tablet (600 mg total) by mouth 3 (three) times daily., Disp: 90 tablet, Rfl: 3   glucose blood (ACCU-CHEK GUIDE) test strip, USE TO TEST BLOOD SUGAR UP TO 5 TIMES DAILY, Disp: 100 strip, Rfl: 11   insulin aspart (NOVOLOG) 100 UNIT/ML injection, Up to 100 units daily in pump, Disp: , Rfl:    Insulin Detemir (LEVEMIR FLEXTOUCH) 100 UNIT/ML Pen, Inject into the skin., Disp: , Rfl:    Insulin Human (INSULIN PUMP) SOLN, Inject into the skin., Disp: , Rfl:    INSULIN SYRINGE .5CC/29G (B-D INSULIN SYRINGE) 29G X 1/2" 0.5 ML MISC, USE TO INJECT INSULIN EVERY DAY., Disp: 100 each, Rfl: 2   levothyroxine (SYNTHROID) 125 MCG tablet, Take 250 mcg by mouth See admin instructions. Take 2 tablets daily Monday through Saturday., Disp: , Rfl:    lidocaine (LIDODERM) 5 %,  Place 1 patch onto the skin daily. Remove & Discard patch within 12 hours or as directed by MD, Disp: 30 patch, Rfl: 0   meloxicam (MOBIC) 15 MG tablet, Take 1 tablet by mouth daily., Disp: , Rfl:   Clinical Interview:   The following information was obtained during a clinical interview with Ronald Banks prior to cognitive testing.  Cognitive Symptoms: Decreased short-term memory: Endorsed. He reported generalized short-term memory dysfunction, stating that he "can't remember nothing." With more direct  questioning, he reported trouble misplacing/losing things, as well as trouble remembering to take his medications. He was unable to describe a timeline of memory dysfunction, but did believe that dysfunction had progressively worsened over time.  Decreased long-term memory: Denied. Decreased attention/concentration: Endorsed. He reported a longstanding history of impairment with sustained attention and distractibility. He reported commonly starting and leaving projects unfinished and issues with reading comprehension. Difficulties were said to extend back to childhood and adolescence and have more or less persisted throughout his life. He reported always wondering if he had ADHD but had never been formally tested in the past.  Reduced processing speed: Denied. Rather, he reported that his mind is "always racing."  Difficulties with executive functions: Endorsed. Generally, he reported trouble with organization, attributing this back to him commonly misplacing/losing things. He also alluded to mild impulsivity from time to time. He denied trouble with decision making or any overt personality changes.  Difficulties with emotion regulation: Denied. Difficulties with receptive language: Denied. Difficulties with word finding: Endorsed. Decreased visuoperceptual ability: Denied.  Difficulties completing ADLs: Somewhat. He reported that he will sometimes forget to take his medications (he did prior to the current evaluation). He does not appear to have a good system in place and merely relies on his pills remaining in pill bottles near his television. He reported that his girlfriend manages finances and bill paying responsibilities. Prior records stated that his mother was doing this when he once resided with her. No driving related difficulties were reported outside of occasionally forgetting directions.   Additional Medical History: History of traumatic brain injury/concussion: Denied. History of stroke:  Denied. History of seizure activity: Denied. History of known exposure to toxins: Denied. Symptoms of chronic pain: Endorsed. He reported chronic lower back pain.  Experience of frequent headaches/migraines: Denied. However, headache symptoms were said to occur "sometimes."  Frequent instances of dizziness/vertigo: Denied. Symptoms were said to generally only be present when his blood sugar gets low.   Sensory changes: He utilizes glasses with benefit. He reported some hearing loss in that his ears seem "cloggy" or "muffled." Other sensory changes/difficulties (e.g., taste or smell) were denied.  Balance/coordination difficulties: Endorsed. He described his balance as "not too good" and described vague symptoms of  feeling "just unsteady." He did not report lower extremity weakness and one side of the body was not said to be worse than the other. He denied any recent falls.  Other motor difficulties: Denied.  Sleep History: Estimated hours obtained each night: 8 hours.  Difficulties falling asleep: Denied. Difficulties staying asleep: Denied. Feels rested and refreshed upon awakening: Endorsed.  History of snoring: Denied. History of waking up gasping for air: Denied. Witnessed breath cessation while asleep: Denied.  History of vivid dreaming: Denied. Excessive movement while asleep: Denied. Instances of acting out his dreams: Denied.  Psychiatric/Behavioral Health History: Depression: Endorsed. He acknowledged a history of depression. When asked about the current effectiveness of medications, he was unsure, stating that he will forget to take these medications.  He denied ever working with a therapist/counselor or psychiatrist in the past. He described his current mood as "up and down." Current or remote suicidal ideation, intent, or plan was denied.  Anxiety: Denied. Mania: Denied. Trauma History: Denied. Visual/auditory hallucinations: Denied. Delusional thoughts: Denied.  Tobacco:  Endorsed. He reported consuming about 1.5 packs of cigarettes daily.  Alcohol: He reported occasional alcohol consumption and denied a history of problematic alcohol abuse or dependence.  Recreational drugs: Endorsed. He reported smoking marijuana approximately 2-3 times per day. He noted that this is used to "give me energy." He had most recently smoked the night before the current evaluation.  Family History: Problem Relation Age of Onset   Diabetes Mother    Cancer Mother        Brain, breast    Diabetes Sister    Diabetes Maternal Grandmother    This information was confirmed by Ronald Banks.  Academic/Vocational History: Highest level of educational attainment: 8 years. He left school in the middle of the 9th grade, stating that he "didn't like it." He reported being a poor (F) student in academic settings, in part due to disinterest in school settings.  History of developmental delay: Denied. History of grade repetition: Denied. Enrollment in special education courses: Denied. History of LD/ADHD: Denied.  Employment: He currently works with a Nurse, learning disability as a Arts administrator.   Evaluation Results:   Behavioral Observations: Ronald Banks was unaccompanied, arrived to his appointment approximately 15 minutes late, and was appropriately dressed and groomed. He appeared alert and oriented. Observed gait and station were within normal limits. Gross motor functioning appeared intact upon informal observation and no abnormal movements (e.g., tremors) were noted. His affect was generally relaxed and positive. Spontaneous speech was fluent and word finding difficulties were not observed during the clinical interview. Thought processes were coherent, organized, and normal in content. Insight into his cognitive difficulties appeared adequate. During testing, sustained attention was appropriate. Task engagement was adequate and he generally persisted when challenged. However, the exception  to this was surrounding memory tasks where he often attempted to give up almost immediately, stating that he could not recall any information. Overall, Mr. Hathorne was cooperative with the clinical interview and subsequent testing procedures.   Adequacy of Effort: The validity of neuropsychological testing is limited by the extent to which the individual being tested may be assumed to have exerted adequate effort during testing. Ronald Banks expressed his intention to perform to the best of his abilities and exhibited adequate task engagement and persistence. Scores across stand-alone and embedded performance validity measures were variable and there were concerns surrounding poor persistence surrounding memory tasks (i.e., Mr. Trageser would almost immediately give up, stating that he could not remember any words). As such, the results of the current evaluation should be interpreted with caution and lower scores may underestimate true abilities to an unknown extent.   Test Results: Mr. Brodowski was largely oriented at the time of the current evaluation. He was one day off both when stating the current date and current day of the week.  Intellectual abilities based upon educational and vocational attainment were estimated to be in the below average range. Premorbid abilities were estimated to be within the well below average range based upon a single-word reading test.   Processing speed was variable, ranging from the well below average to average normative ranges. Basic attention was well below average. More complex attention (e.g., working memory) was below average. However, this score was obtained after  only answering two items correctly. Executive functioning was variable, ranging from the exceptionally low to average normative ranges.  Assessed receptive language abilities were above average. Likewise, Mr. Hemp did not exhibit any difficulties comprehending task instructions and answered all questions asked of him  appropriately. Assessed expressive language (e.g., verbal fluency and confrontation naming) was below average to above average.     Assessed visuospatial/visuoconstructional abilities were average.    Learning (i.e., encoding) of novel verbal information was exceptionally low to well below average. Spontaneous delayed recall (i.e., retrieval) of previously learned information was also exceptionally low to well below average. Retention rates were 75% across a story learning task and 100% across a list learning task. Performance across a list learning recognition task was well below average, suggesting limited evidence for information consolidation.   Results of emotional screening instruments suggested that recent symptoms of generalized anxiety were in the minimal range, while symptoms of depression were within the moderate range. A screening instrument assessing recent sleep quality suggested the presence of minimal sleep dysfunction.  Across a childhood ADHD symptom questionnaire, Mr. Azer scored in the "highly likely to have ADHD" range across symptoms of inattention and in the "likely to have ADHD" range across symptoms of hyperactivity/impulsivity. Across a more comprehensive ADHD-related questionnaire, he significantly elevated subscales surrounding inattention and overall symptoms consistent with ADHD.   Tables of Scores:   Note: This summary of test scores accompanies the interpretive report and should not be considered in isolation without reference to the appropriate sections in the text. Descriptors are based on appropriate normative data and may be adjusted based on clinical judgment. Terms such as "Within Normal Limits" and "Outside Normal Limits" are used when a more specific description of the test score cannot be determined.       Percentile - Normative Descriptor > 98 - Exceptionally High 91-97 - Well Above Average 75-90 - Above Average 25-74 - Average 9-24 - Below Average 2-8 -  Well Below Average < 2 - Exceptionally Low       Validity:   DESCRIPTOR       ACS Word Choice: --- --- Outside Normal Limits  Dot Counting Test: --- --- Within Normal Limits  Rey 15:       Free Recall --- --- Within Normal Limits  NAB EVI: --- --- Outside Normal Limits  D-KEFS Color Word Effort Index: --- --- Within Normal Limits       Orientation:      Raw Score Percentile   NAB Orientation, Form 1 27/29 --- ---       Intellectual Functioning:      Standard Score Percentile   Barona Formula Estimated Premorbid IQ 90 25 Average        Standard Score Percentile   Test of Premorbid Functioning: 28 4 Well Below Average       Memory:     NAB Memory Module, Form 1: T Score Percentile   List Learning       Total Trials 1-3 11/36 (30) 2 Well Below Average    List B 0/12 (21) <1 Exceptionally Low    Short Delay Free Recall 3/12 (34) 5 Well Below Average    Long Delay Free Recall 3/12 (35) 7 Well Below Average    Retention Percentage 100 (50) 50 Average    Recognition Discriminability 3 (34) 5 Well Below Average  Story Learning       Immediate Recall 21/80 (27) 1 Exceptionally Low    Delayed Recall 12/40 (  34) 5 Well Below Average    Retention Percentage 75 (29) 2 Well Below Average       Attention/Executive Function:     Trail Making Test (TMT): Raw Score (T Score) Percentile     Part A 33 secs.,  0 errors (55) 69 Average    Part B 125 secs.,  0 errors (41) 18 Below Average         Scaled Score Percentile   WAIS-IV Coding: 9 37 Average       NAB Attention Module, Form 1: T Score Percentile     Digits Forward 30 2 Well Below Average    Digits Backwards 40 16 Below Average       D-KEFS Color-Word Interference Test: Raw Score (Scaled Score) Percentile     Color Naming 36 secs. (7) 16 Below Average    Word Reading 31 secs. (5) 5 Well Below Average    Inhibition 65 secs. (8) 25 Average      Total Errors 6 errors (3) 1 Exceptionally Low    Inhibition/Switching 65 secs. (9)  37 Average      Total Errors 2 errors (10) 50 Average       D-KEFS Verbal Fluency Test: Raw Score (Scaled Score) Percentile     Letter Total Correct 24 (6) 9 Below Average    Category Total Correct 39 (10) 50 Average    Category Switching Total Correct 9 (4) 2 Well Below Average    Category Switching Accuracy 8 (6) 9 Below Average      Total Set Loss Errors 1 (11) 63 Average      Total Repetition Errors 1 (11) 63 Average       Language:     Verbal Fluency Test: Raw Score (T Score) Percentile     Phonemic Fluency (FAS) 24 (38) 12 Below Average    Animal Fluency 23 (61) 86 Above Average        NAB Language Module, Form 1: T Score Percentile     Auditory Comprehension 57 75 Above Average    Naming 29/31 (43) 25 Average       Visuospatial/Visuoconstruction:      Raw Score Percentile   Clock Drawing: 10/10 --- Within Normal Limits       NAB Spatial Module, Form 1: T Score Percentile     Figure Drawing Copy 55 69 Average        Scaled Score Percentile   WAIS-IV Block Design: 8 25 Average  WAIS-IV Matrix Reasoning: 5 5 Well Below Average       Mood and Personality:      Raw Score Percentile   Beck Depression Inventory - II: 20 --- Moderate  PROMIS Anxiety Questionnaire: 15 --- None to Slight       Additional Questionnaires:     Adult Self-Report Scale (Childhood): Raw Score Percentile     Inattention 31 --- Highly likely to have ADHD    Hyperactive/Impulsive 20 --- Likely to have ADHD       CAARS-Self Report: T Score Percentile     Inattention/Memory Problems 67  Much Above Average    Hyperactivity/Restlessness 67  Much Above Average    Impulsivity/Emotional Lability 58  Average      Problems with Self-Concept 62  Above Average    DSM-IV Inattentive Symptoms 90  Very Much Above Average    DSM-IV Hyperactive-Impulsive Symptoms 67  Much Above Average    DSM-IV ADHD Symptoms Total 85  Very Much Above Average  ADHD Index 67  Much Above Average       PROMIS Sleep  Disturbance Questionnaire: 13 --- None to Slight    Informed Consent and Coding/Compliance:   The current evaluation represents a clinical evaluation for the purposes previously outlined by the referral source and is in no way reflective of a forensic evaluation.   Mr. Ky was provided with a verbal description of the nature and purpose of the present neuropsychological evaluation. Also reviewed were the foreseeable risks and/or discomforts and benefits of the procedure, limits of confidentiality, and mandatory reporting requirements of this provider. The patient was given the opportunity to ask questions and receive answers about the evaluation. Oral consent to participate was provided by the patient.   This evaluation was conducted by Christia Reading, Ph.D., licensed clinical neuropsychologist. Mr. Pursley completed a clinical interview with Dr. Melvyn Novas, billed as one unit [phone], and 120 minutes of cognitive testing and scoring, billed as one unit [phone] and three additional units 96137. As a separate and discrete service, Dr. Melvyn Novas spent a total of 160 minutes in interpretation and report writing billed as one unit [phone] and two units 96133.

## 2020-10-21 ENCOUNTER — Encounter: Payer: Medicaid Other | Admitting: Psychology

## 2020-11-10 ENCOUNTER — Other Ambulatory Visit: Payer: Self-pay | Admitting: Internal Medicine

## 2020-11-10 DIAGNOSIS — M545 Low back pain, unspecified: Secondary | ICD-10-CM

## 2020-11-10 NOTE — Telephone Encounter (Signed)
Requested medications are due for refill today.  yes  Requested medications are on the active medications list.  yes  Last refill. 09/03/2020  Future visit scheduled.   yes  Notes to clinic.  Medication not delegated.

## 2020-12-12 ENCOUNTER — Other Ambulatory Visit: Payer: Self-pay | Admitting: Internal Medicine

## 2020-12-13 NOTE — Telephone Encounter (Signed)
Requested Prescriptions  Pending Prescriptions Disp Refills  . FLUoxetine (PROZAC) 10 MG capsule [Pharmacy Med Name: FLUOXETINE HCL 10 MG CAPSULE] 90 capsule 1    Sig: TAKE 1 CAPSULE BY MOUTH EVERY DAY     Psychiatry:  Antidepressants - SSRI Passed - 12/12/2020  8:16 PM      Passed - Completed PHQ-2 or PHQ-9 in the last 360 days      Passed - Valid encounter within last 6 months    Recent Outpatient Visits          3 months ago DM type 1 with diabetic peripheral neuropathy [facility])   [place] Karle Plumber B, MD   7 months ago Chronic midline low back pain without sciatica   [place], MD   11 months ago Acute right-sided low back pain without sciatica   [place], MD   1 year ago Acute right-sided low back pain without sciatica   [place], Deborah B, MD   1 year ago DM type 1 with diabetic peripheral neuropathy [facility])   [place], Deborah B, MD      Future Appointments            In 3 weeks Wynetta Emery Dalbert Batman, MD [place]

## 2020-12-24 ENCOUNTER — Other Ambulatory Visit: Payer: Self-pay | Admitting: Otolaryngology

## 2020-12-24 DIAGNOSIS — R220 Localized swelling, mass and lump, head: Secondary | ICD-10-CM

## 2020-12-28 ENCOUNTER — Telehealth: Payer: Self-pay | Admitting: Neurology

## 2020-12-28 NOTE — Telephone Encounter (Signed)
LVM for patient that his appt was rescheduled due to Judson Roch being out.

## 2021-01-03 ENCOUNTER — Ambulatory Visit: Payer: Medicaid Other | Admitting: Internal Medicine

## 2021-01-10 ENCOUNTER — Ambulatory Visit: Payer: Medicaid Other | Admitting: Neurology

## 2021-01-18 ENCOUNTER — Ambulatory Visit: Admission: RE | Admit: 2021-01-18 | Payer: Medicaid Other | Source: Ambulatory Visit

## 2021-01-26 ENCOUNTER — Ambulatory Visit
Admission: EM | Admit: 2021-01-26 | Discharge: 2021-01-26 | Disposition: A | Discharge status: Home / Self Care | Payer: Medicaid Other | Attending: Emergency Medicine | Admitting: Emergency Medicine

## 2021-01-26 ENCOUNTER — Encounter: Payer: Self-pay | Admitting: Emergency Medicine

## 2021-01-26 ENCOUNTER — Ambulatory Visit (INDEPENDENT_AMBULATORY_CARE_PROVIDER_SITE_OTHER): Payer: Medicaid Other

## 2021-01-26 ENCOUNTER — Other Ambulatory Visit: Payer: Self-pay

## 2021-01-26 DIAGNOSIS — R0781 Pleurodynia: Secondary | ICD-10-CM | POA: Diagnosis not present

## 2021-01-26 DIAGNOSIS — R079 Chest pain, unspecified: Secondary | ICD-10-CM

## 2021-01-26 IMAGING — DX DG RIBS W/ CHEST 3+V*R*
3 series · 3 of 3 positions shown · non-contrast
Comparison: None.

CLINICAL DATA: Pain and right anterior ribs which is worth with

EXAM:
RIGHT RIBS AND CHEST - 3+ VIEW

[chest pa]
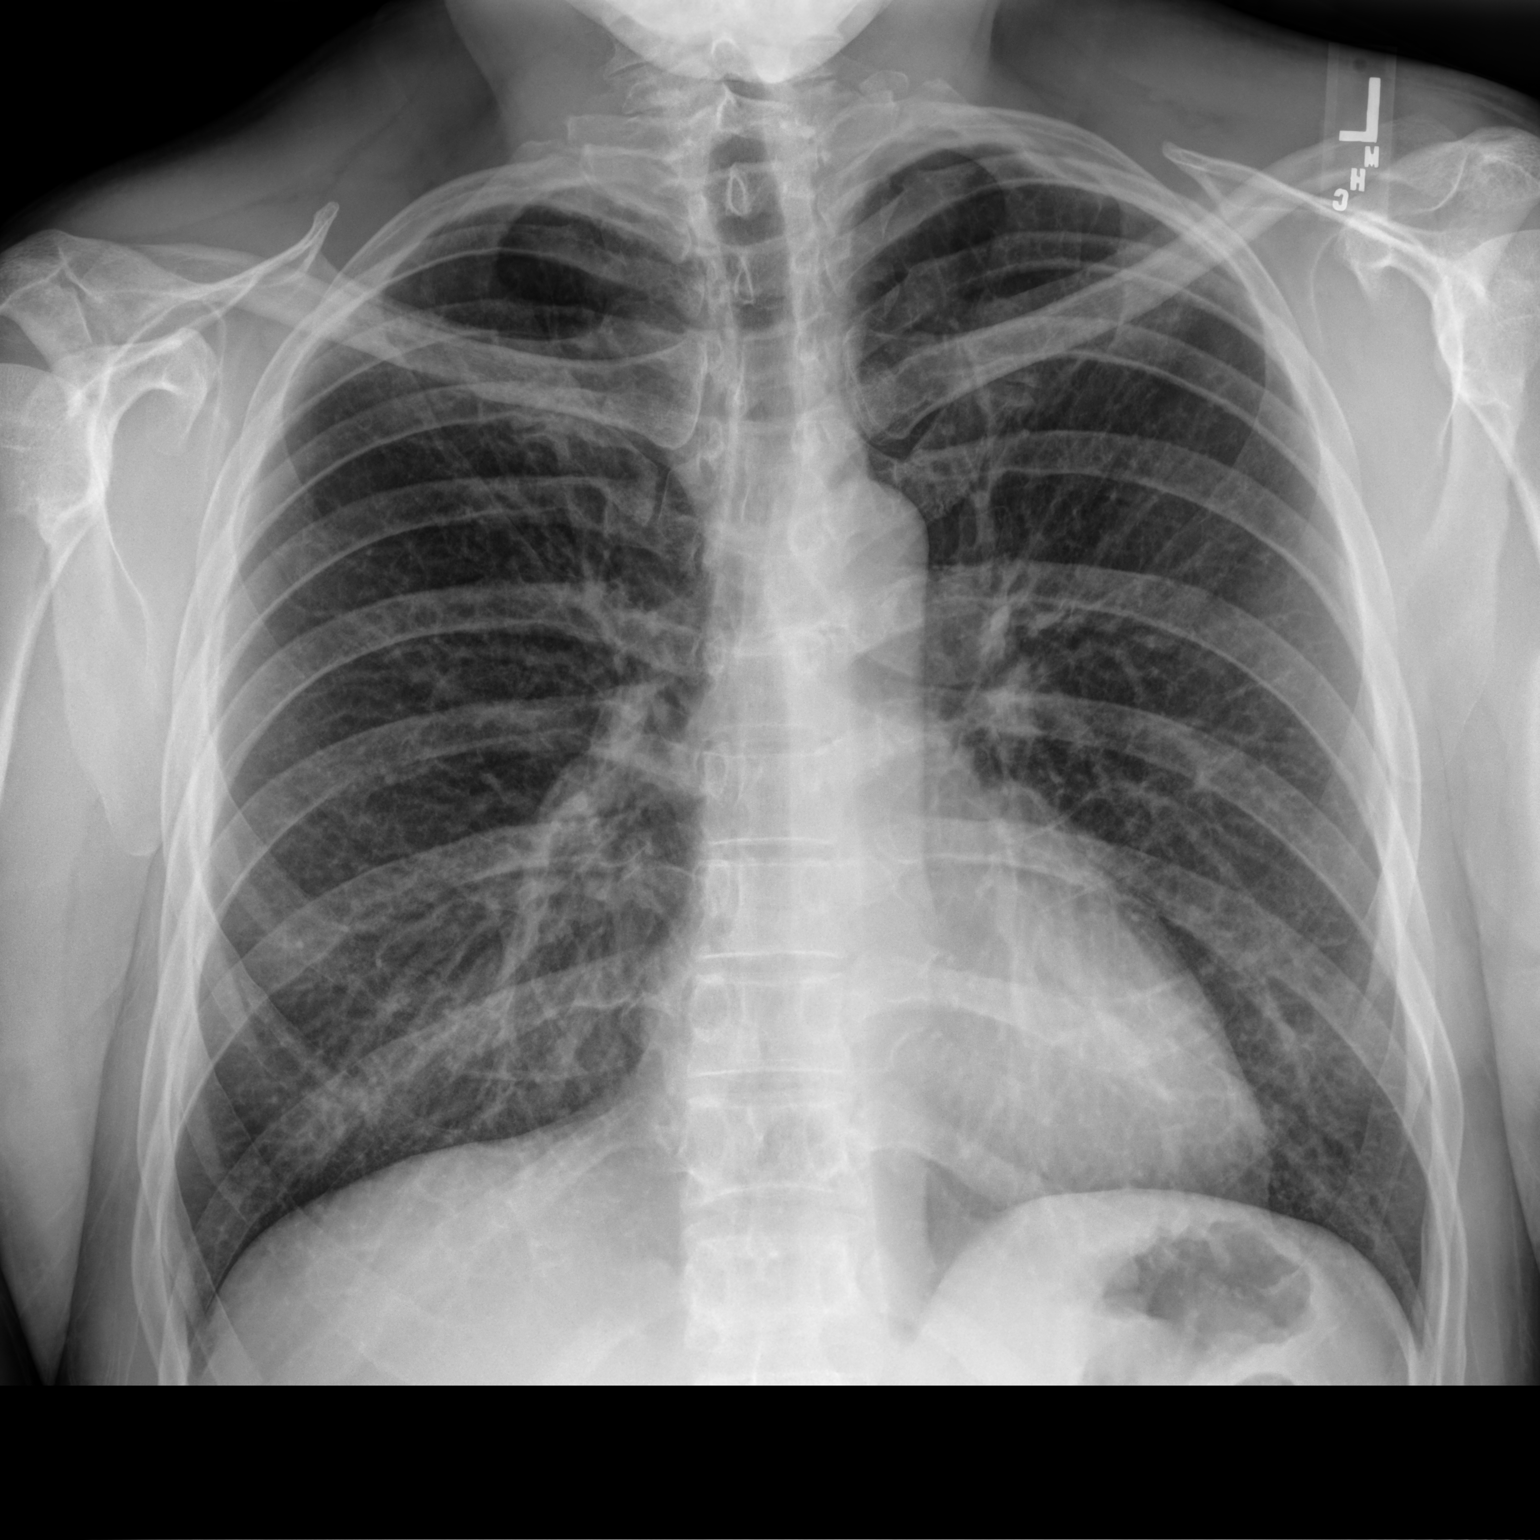

[hemithorax (ribs) ap]
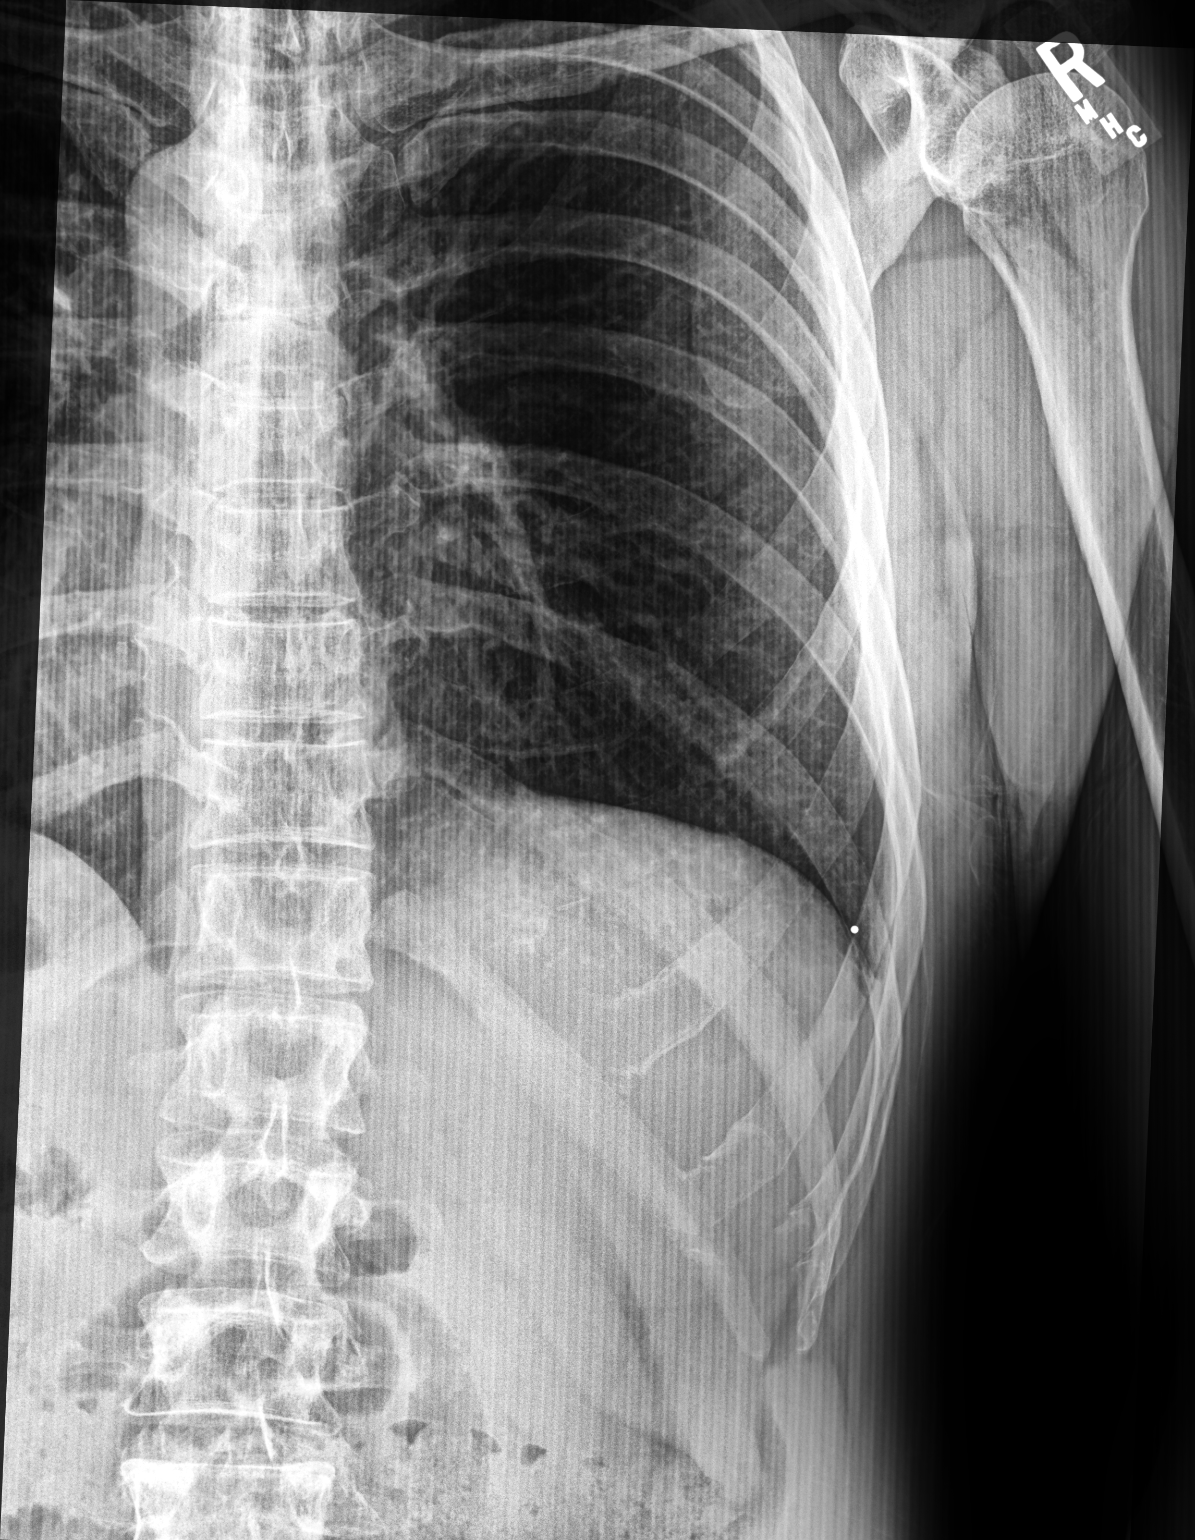

[hemithorax (ribs) mlo]
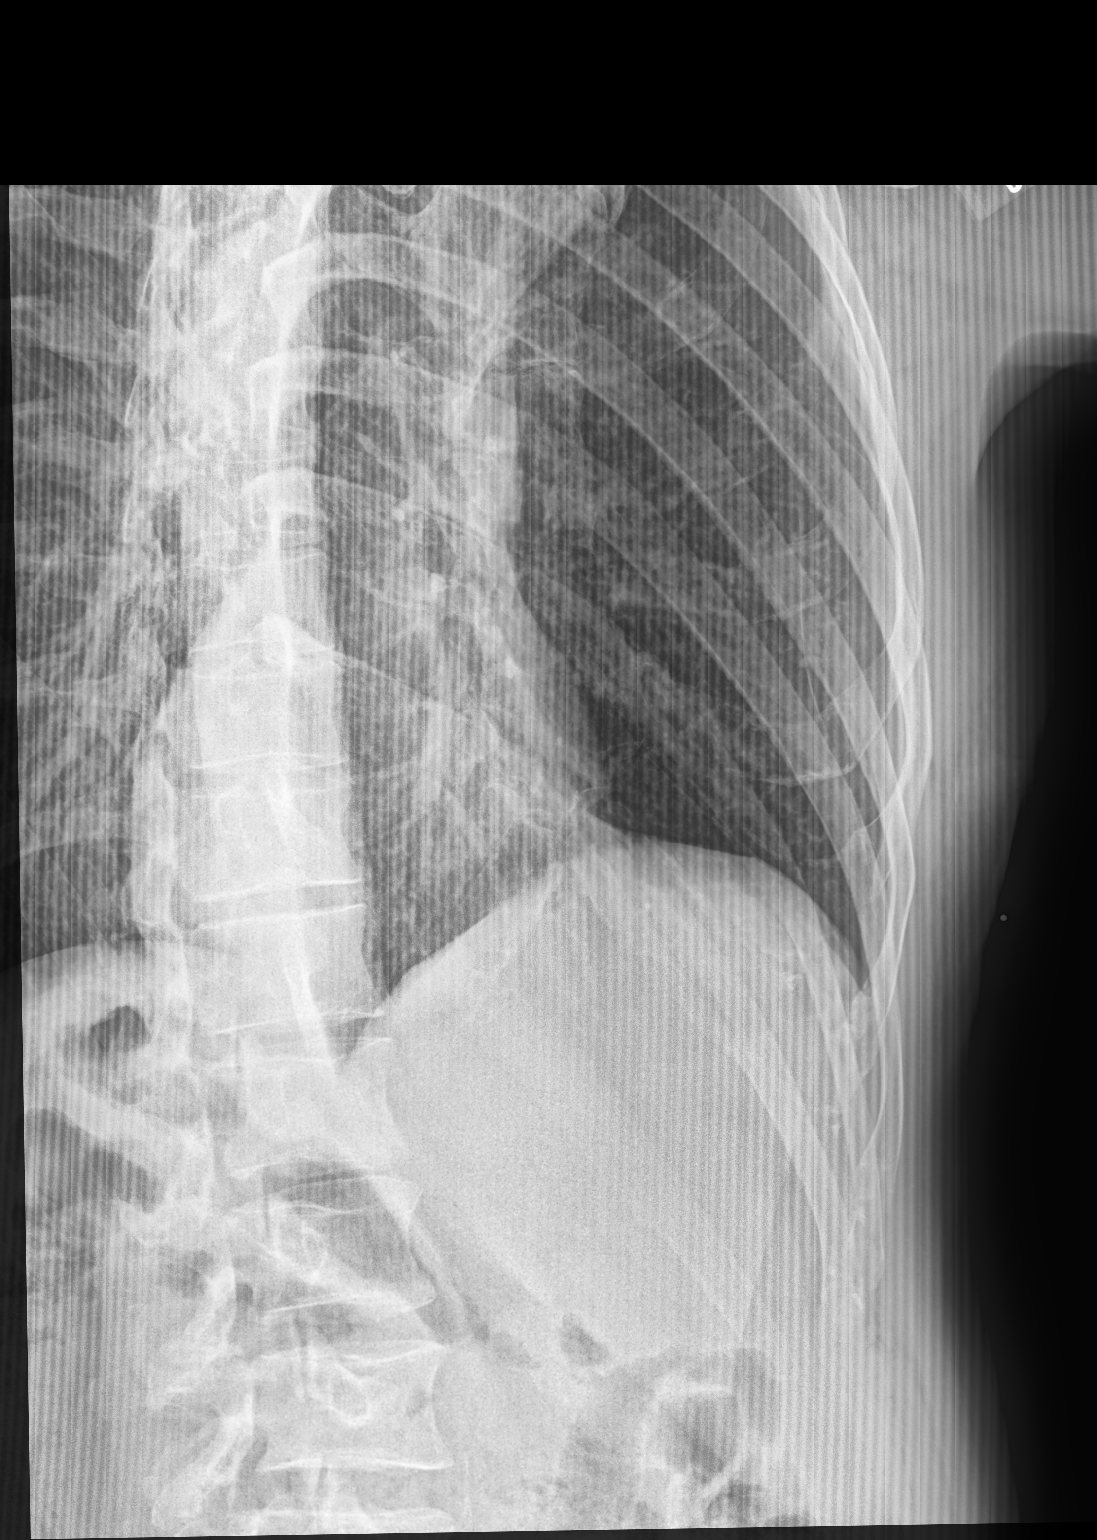

[3 of 3 positions shown; findings below may reference images not displayed]

FINDINGS: No fracture or other bone lesions are seen involving the ribs. There
is no evidence of pneumothorax or pleural effusion. Both lungs are
clear. Heart size and mediastinal contours are within normal limits.
IMPRESSION: Negative.

## 2021-01-26 MED ORDER — CYCLOBENZAPRINE HCL 5 MG PO TABS: 5.0000 mg | ORAL_TABLET | Freq: Three times a day (TID) | ORAL | 0 refills | Status: DC | PRN

## 2021-01-26 NOTE — Discharge Instructions (Addendum)
Your chest xray is normal.   Take Tylenol or ibuprofen as needed for discomfort.   Take the muscle relaxer as needed for muscle spasm; Do not drive, operate machinery, or drink alcohol with this medication as it can cause drowsiness.  Follow up with your primary care provider if your symptoms are not improving.

## 2021-01-26 NOTE — ED Triage Notes (Signed)
Pt presents with right side chest pain x 6 days. Pt does heavy lifting but unsure what could have caused the pain. Pt states it hurts to take a deep breath in.

## 2021-01-26 NOTE — ED Provider Notes (Signed)
Ronald Banks    CSN: [id number] Arrival date & time: 01/26/21  0820      History   Chief Complaint Chief Complaint  Patient presents with   Chest Pain    HPI Ronald Banks is a 46 y.o. male.  Patient presents with right anterior lower chest and rib pain x 6 days.  The pain started after he took a nap.  He thinks his large dog may have jumped on him or that he may have strained it lifting heavy objects.  The pain is worse with deep breath and movement; improves with rest. Currently 8/10.  No bruising, redness, wounds, rash, unusual cough, shortness of breath, abdominal pain, vomiting, nausea, or other symptoms.  He has a chronic morning cough from smoking.  His medical history includes type 1 diabetes, current everyday smoker, chronic low back pain, chronic shoulder pain, hypertension, dyspnea on exertion, ADHD, depression.  The history is provided by the patient and medical records.   Past Medical History:  Diagnosis Date   ADHD (attention deficit hyperactivity disorder), inattentive type    Chronic midline low back pain with right-sided sciatica 02/11/2018   Chronic right shoulder pain 02/11/2018   DKA (diabetic ketoacidosis) 08/17/2020   DOE (dyspnea on exertion) 05/19/2019   Onset 2015/16 05/19/2019   Walked RA x two laps =  approx 553f @ brisk pace - stopped due to end of study with sats of 98 % at the end of the study and c/o sob s cp  > rec CPST next  - alpha one AT  Screen   05/19/19 MM  152  - CPST  06/30/19  FEV1 3.79 (93%) with ratio 0.73 and nl f/v loop: ex study  probabably  wnl when wt factored  In  - sob with ventilatory reserve suggested sub max effort and de   Hyperlipidemia due to type 1 diabetes mellitus 04/18/2018   Hypertension associated with diabetes 04/18/2018   Trial off acei 04/07/2019 due to cough > improved by 07/03/2019 with just a typical smoker's rattle (no longer cough to point of gag/vomit)   Hypothyroidism    Major depressive disorder 10/09/2019    Moderate nonproliferative diabetic retinopathy of both eyes (HDuncan 05/19/2019   Tobacco dependence 10/09/2019   Type 1 diabetes mellitus with diabetic polyneuropathy 04/18/2018    Patient Active Problem List   Diagnosis Date Noted   ADHD (attention deficit hyperactivity disorder), inattentive type    DKA (diabetic ketoacidosis) 08/17/2020   Tobacco dependence 10/09/2019   Major depressive disorder 10/09/2019   Moderate nonproliferative diabetic retinopathy of both eyes 05/19/2019   DOE (dyspnea on exertion) 05/19/2019   Upper airway cough syndrome 04/07/2019   Hypertension associated with diabetes 04/18/2018   Hyperlipidemia due to type 1 diabetes mellitus 04/18/2018   Type 1 diabetes mellitus with diabetic polyneuropathy 04/18/2018   Hypothyroidism 02/11/2018   Chronic right shoulder pain 02/11/2018   Chronic midline low back pain with right-sided sciatica 02/11/2018    Past Surgical History:  Procedure Laterality Date   DENTAL SURGERY     EAR CYST EXCISION Right 03/24/2020   Procedure: EXCISION PRE-AURICULAR CYST;  Surgeon: VCarloyn Manner MD;  [facility]  Service: ENT;  Laterality: Right;  diabetic insulin pump   PREAURICULAR CYST EXCISION Left 07/07/2020   Procedure: EXCISION PREAURICULAR CYST ADULT;  Surgeon: VCarloyn Manner MD;  [place] [facility]  Service: ENT;  Laterality: Left;       Home Medications    Prior to  Admission medications   Medication Sig Start Date End Date Taking? Authorizing Provider  cyclobenzaprine (FLEXERIL) 5 MG tablet Take 1 tablet (5 mg total) by mouth 3 (three) times daily as needed for muscle spasms. 01/26/21  Yes Sharion Balloon, NP  insulin aspart (NOVOLOG) 100 UNIT/ML injection Up to 100 units daily in pump 08/12/20  Yes [provider]  Insulin Detemir (LEVEMIR FLEXTOUCH) 100 UNIT/ML Pen Inject into the skin. 04/18/18 01/26/21 Yes [provider]  Insulin Human (INSULIN PUMP) SOLN Inject into  the skin.   Yes [provider]  levothyroxine (SYNTHROID) 125 MCG tablet Take 250 mcg by mouth See admin instructions. Take 2 tablets daily Monday through Saturday. 07/29/20  Yes [provider]  ACCU-CHEK FASTCLIX LANCETS MISC Use to check blood sugar up to 5 times daily. 03/05/18   Fulp, Cammie, MD  Blood Glucose Monitoring Suppl (ACCU-CHEK GUIDE) w/Device KIT 1 kit by Does not apply route 5 (five) times daily. 03/05/18   Fulp, Cammie, MD  Continuous Blood Gluc Sensor (DEXCOM G6 SENSOR) MISC SMARTSIG:Topical Every 10 Days 06/02/20   [provider]  Continuous Blood Gluc Transmit (DEXCOM G6 TRANSMITTER) MISC Use to monitor blood sugar.  Replace every 3 months 03/24/20   [provider]  FLUoxetine (PROZAC) 10 MG capsule TAKE 1 CAPSULE BY MOUTH EVERY DAY 12/13/20   Ladell Pier, MD  fluticasone [facility]) 50 MCG/ACT nasal spray Place 2 sprays into both nostrils daily. 07/30/19   Argentina Donovan, PA-C  gabapentin (NEURONTIN) 600 MG tablet Take 1 tablet (600 mg total) by mouth 3 (three) times daily. 05/04/20   Ladell Pier, MD  glucose blood (ACCU-CHEK GUIDE) test strip USE TO TEST BLOOD SUGAR UP TO 5 TIMES DAILY 09/03/20   Ladell Pier, MD  INSULIN SYRINGE .5CC/29G (B-D INSULIN SYRINGE) 29G X 1/2" 0.5 ML MISC USE TO INJECT INSULIN EVERY DAY. 05/28/19   Fulp, Cammie, MD  lidocaine (LIDODERM) 5 % Place 1 patch onto the skin daily. Remove & Discard patch within 12 hours or as directed by MD 05/12/20   Raulkar, Clide Deutscher, MD  meloxicam (MOBIC) 15 MG tablet Take 1 tablet by mouth daily. 05/29/20   [provider]    Family History Family History  Problem Relation Age of Onset   Diabetes Mother    Cancer Mother        Brain, breast    Diabetes Sister    Diabetes Maternal Grandmother     Social History Social History   Tobacco Use   Smoking status: Every Day    Packs/day: 1.50    Years: 30.00    Pack years: 45.00    Types: Cigarettes    Smokeless tobacco: Never  Vaping Use   Vaping Use: Never used  Substance Use Topics   Alcohol use: Not Currently   Drug use: Yes    Types: Marijuana    Comment: Several times per week     Allergies   Patient has no known allergies.   Review of Systems Review of Systems  Constitutional:  Negative for chills and fever.  Respiratory:  Negative for cough and shortness of breath.   Cardiovascular:  Positive for chest pain. Negative for palpitations.  Gastrointestinal:  Negative for abdominal pain, nausea and vomiting.  Skin:  Negative for color change, rash and wound.  All other systems reviewed and are negative.   Physical Exam Triage Vital Signs ED Triage Vitals  Enc Vitals Group     BP  Pulse      Resp      Temp      Temp src      SpO2      Weight      Height      Head Circumference      Peak Flow      Pain Score      Pain Loc      Pain Edu?      Excl. in [place]?    No data found.  Updated Vital Signs BP (!) 149/82 (BP Location: Left Arm)    Pulse (!) 56    Temp 98.1 F (36.7 C) (Oral)    Resp 18    SpO2 98%   Visual Acuity Right Eye Distance:   Left Eye Distance:   Bilateral Distance:    Right Eye Near:   Left Eye Near:    Bilateral Near:     Physical Exam Vitals and nursing note reviewed.  Constitutional:      General: He is not in acute distress.    Appearance: He is well-developed.  HENT:     Head: Normocephalic and atraumatic.     Mouth/Throat:     Mouth: Mucous membranes are moist.  Eyes:     Conjunctiva/sclera: Conjunctivae normal.  Cardiovascular:     Rate and Rhythm: Regular rhythm. Bradycardia present.     Heart sounds: Normal heart sounds.  Pulmonary:     Effort: Pulmonary effort is normal. No respiratory distress.     Breath sounds: Normal breath sounds.  Chest:    Abdominal:     General: Bowel sounds are normal.     Palpations: Abdomen is soft.     Tenderness: There is no abdominal tenderness. There is no guarding or  rebound.  Musculoskeletal:        General: Tenderness present. No swelling or deformity. Normal range of motion.     Cervical back: Neck supple.  Skin:    General: Skin is warm and dry.     Findings: No bruising, erythema, lesion or rash.  Neurological:     General: No focal deficit present.     Mental Status: He is alert and oriented to person, place, and time.     Sensory: No sensory deficit.     Motor: No weakness.     Gait: Gait normal.  Psychiatric:        Mood and Affect: Mood normal.        Behavior: Behavior normal.     UC Treatments / Results  Labs (all labs ordered are listed, but only abnormal results are displayed) Labs Reviewed - No data to display  EKG   Radiology DG Ribs Unilateral W/Chest Right  Result Date: 01/26/2021 CLINICAL DATA:  Pain and right anterior ribs which is worth with EXAM: RIGHT RIBS AND CHEST - 3+ VIEW COMPARISON:  None. FINDINGS: No fracture or other bone lesions are seen involving the ribs. There is no evidence of pneumothorax or pleural effusion. Both lungs are clear. Heart size and mediastinal contours are within normal limits. IMPRESSION: Negative. Electronically Signed   By: Kerby Moors M.D.   On: 01/26/2021 09:19    Procedures Procedures (including critical care time)  Medications Ordered in UC Medications - No data to display  Initial Impression / Assessment and Plan / UC Course  I have reviewed the triage vital signs and the nursing notes.  Pertinent labs & imaging results that were available during my care of the patient  were reviewed by me and considered in my medical decision making (see chart for details).  Chest pain, rib pain.  EKG shows sinus bradycardia, rate 55, no ST elevation, compared to previous from 08/18/2020.  Chest xray with ribs negative.  Instructed the patient to take Tylenol or ibuprofen as needed for discomfort.  Also treating with cyclobenzaprine; precautions for drowsiness with this medication discussed.   Instructed patient to follow-up with his PCP if his symptoms are not improving.  He agrees to plan of care.   Final Clinical Impressions(s) / UC Diagnoses   Final diagnoses:  Chest pain, unspecified type  Rib pain     Discharge Instructions      Your chest xray is normal.   Take Tylenol or ibuprofen as needed for discomfort.   Take the muscle relaxer as needed for muscle spasm; Do not drive, operate machinery, or drink alcohol with this medication as it can cause drowsiness.  Follow up with your primary care provider if your symptoms are not improving.         ED Prescriptions     Medication Sig Dispense Auth. Provider   cyclobenzaprine (FLEXERIL) 5 MG tablet Take 1 tablet (5 mg total) by mouth 3 (three) times daily as needed for muscle spasms. 15 tablet Sharion Balloon, NP      I have reviewed the PDMP during this encounter.   Sharion Balloon, NP 01/26/21 [phone]

## 2021-01-27 ENCOUNTER — Ambulatory Visit: Payer: Medicaid Other | Admitting: Neurology

## 2021-01-27 ENCOUNTER — Encounter: Payer: Self-pay | Admitting: Neurology

## 2021-01-27 ENCOUNTER — Telehealth: Payer: Self-pay | Admitting: Neurology

## 2021-01-27 VITALS — BP 137/70 | HR 81 | Ht 70.0 in | Wt 198.0 lb

## 2021-01-27 DIAGNOSIS — R413 Other amnesia: Secondary | ICD-10-CM

## 2021-01-27 DIAGNOSIS — F900 Attention-deficit hyperactivity disorder, predominantly inattentive type: Secondary | ICD-10-CM | POA: Diagnosis not present

## 2021-01-27 DIAGNOSIS — F331 Major depressive disorder, recurrent, moderate: Secondary | ICD-10-CM | POA: Diagnosis not present

## 2021-01-27 NOTE — Progress Notes (Addendum)
PATIENT: Ronald Banks DOB: [birth year]  REASON FOR VISIT: Follow up for memory  HISTORY FROM: Patient PRIMARY NEUROLOGIST: Dr. Rexene Alberts since Dr. Jannifer Franklin retired  HISTORY OF PRESENT ILLNESS: Today 01/27/21 Ronald Banks here today for follow-up with history of memory issues.  Had neuropsychological evaluation in August 2022, most likely culprit for ongoing cognitive function is underlying and untreated ADHD exacerbated by psychiatric distress, various medical conditions, and regular marijuana consumption.  MRI of the brain was ordered, but he did not schedule it. Here today, still living with his mom, who is in her 47's. Driving a car. 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 Still trouble with the short term, misplacing things. His mom does his finances. Uses his phone my chart reminders. Doesn't see psychiatry for ADHD. Uses pill box, his mom helps him, often forgets to take his pills. He walks for exercise. He sleeps well at night. He feels rested in the morning. Here today alone. MMSE 27/30.  HISTORY  06/30/2020 Dr. Jannifer Franklin: Ronald Banks is a 46 year old right-handed white male with a history of type 1 diabetes, diabetic peripheral neuropathy, carpal tunnel syndrome, hypertension, dyslipidemia, hypothyroidism, depression, and ongoing tobacco abuse with emphysema.  The patient is sent here with a 2 or 3-year history of memory issues that he claims has gradually worsened over time.  He indicates that he has poor short-term memory, he will forget to take medications, he will misplace things about the house frequently.  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  He tried to work 3 to 4 months ago, but he could not continue.  The patient does not do the finances, and he never has.  He lives with his mother who does this for him.  The patient is able to operate a  motor vehicle without difficulty, he uses GPS to get around.  Occasionally he may forget where he is going.  He has some left-sided back pain and sciatica down the left leg, a recent MRI of the lumbar spine did not show a source of the pain.  He claims that he sleeps well at night and he has a good energy level during the day.  He has been treated for depression previously, he claims that this is moderately well controlled currently.  He denies any focal numbness or weakness of the face, arms, legs.  He denies any balance issues or falls.  He denies issues controlling the bowels or the bladder.  He reports no headaches or dizziness.  He has had occasional episodes of low blood sugar, but he has a subcutaneous monitoring system that will warn him when his blood sugars dropping.  He comes to this office for an evaluation.  REVIEW OF SYSTEMS: Out of a complete 14 system review of symptoms, the patient complains only of the following symptoms, and all other reviewed systems are negative.  See HPI  ALLERGIES: No Known Allergies  HOME MEDICATIONS: Outpatient Medications Prior to Visit  Medication Sig Dispense Refill   ACCU-CHEK FASTCLIX LANCETS MISC Use to check blood sugar up to 5 times daily. 102 each 11   Blood Glucose Monitoring Suppl (ACCU-CHEK GUIDE) w/Device KIT 1 kit by Does not apply route 5 (five) times daily. 1 kit 0   Continuous Blood Gluc Sensor (DEXCOM G6 SENSOR) MISC SMARTSIG:Topical Every 10 Days  Continuous Blood Gluc Transmit (DEXCOM G6 TRANSMITTER) MISC Use to monitor blood sugar.  Replace every 3 months     cyclobenzaprine (FLEXERIL) 5 MG tablet Take 1 tablet (5 mg total) by mouth 3 (three) times daily as needed for muscle spasms. 15 tablet 0   FLUoxetine (PROZAC) 10 MG capsule TAKE 1 CAPSULE BY MOUTH EVERY DAY 90 capsule 1   fluticasone (FLONASE) 50 MCG/ACT nasal spray Place 2 sprays into both nostrils daily. 16 g 6   gabapentin (NEURONTIN) 600 MG tablet Take 1 tablet (600 mg  total) by mouth 3 (three) times daily. 90 tablet 3   glucose blood (ACCU-CHEK GUIDE) test strip USE TO TEST BLOOD SUGAR UP TO 5 TIMES DAILY 100 strip 11   insulin aspart (NOVOLOG) 100 UNIT/ML injection Up to 100 units daily in pump     Insulin Human (INSULIN PUMP) SOLN Inject into the skin.     INSULIN SYRINGE .5CC/29G (B-D INSULIN SYRINGE) 29G X 1/2" 0.5 ML MISC USE TO INJECT INSULIN EVERY DAY. 100 each 2   levothyroxine (SYNTHROID) 125 MCG tablet Take 250 mcg by mouth See admin instructions. Take 2 tablets daily Monday through Saturday.     lidocaine (LIDODERM) 5 % Place 1 patch onto the skin daily. Remove & Discard patch within 12 hours or as directed by MD 30 patch 0   meloxicam (MOBIC) 15 MG tablet Take 1 tablet by mouth daily.     Insulin Detemir (LEVEMIR FLEXTOUCH) 100 UNIT/ML Pen Inject into the skin.     No facility-administered medications prior to visit.    PAST MEDICAL HISTORY: Past Medical History:  Diagnosis Date   ADHD (attention deficit hyperactivity disorder), inattentive type    Chronic midline low back pain with right-sided sciatica 02/11/2018   Chronic right shoulder pain 02/11/2018   DKA (diabetic ketoacidosis) 08/17/2020   DOE (dyspnea on exertion) 05/19/2019   Onset 2015/16 05/19/2019   Walked RA x two laps =  approx 570f @ brisk pace - stopped due to end of study with sats of 98 % at the end of the study and c/o sob s cp  > rec CPST next  - alpha one AT  Screen   05/19/19 MM  152  - CPST  06/30/19  FEV1 3.79 (93%) with ratio 0.73 and nl f/v loop: ex study  probabably  wnl when wt factored  In  - sob with ventilatory reserve suggested sub max effort and de   Hyperlipidemia due to type 1 diabetes mellitus 04/18/2018   Hypertension associated with diabetes 04/18/2018   Trial off acei 04/07/2019 due to cough > improved by 07/03/2019 with just a typical smoker's rattle (no longer cough to point of gag/vomit)   Hypothyroidism    Major depressive disorder 10/09/2019   Moderate  nonproliferative diabetic retinopathy of both eyes ([place] 05/19/2019   Tobacco dependence 10/09/2019   Type 1 diabetes mellitus with diabetic polyneuropathy 04/18/2018    PAST SURGICAL HISTORY: Past Surgical History:  Procedure Laterality Date   DENTAL SURGERY     EAR CYST EXCISION Right 03/24/2020   Procedure: EXCISION PRE-AURICULAR CYST;  Surgeon: VCarloyn Manner MD;  [facility]  Service: ENT;  Laterality: Right;  diabetic insulin pump   PREAURICULAR CYST EXCISION Left 07/07/2020   Procedure: EXCISION PREAURICULAR CYST ADULT;  Surgeon: VCarloyn Manner MD;  [facility]  Service: ENT;  Laterality: Left;    FAMILY HISTORY: Family History  Problem Relation Age of Onset   Diabetes Mother  Cancer Mother        Brain, breast    Diabetes Sister    Diabetes Maternal Grandmother     SOCIAL HISTORY: Social History   Socioeconomic History   Marital status: Single    Spouse name: Not on file   Number of children: Not on file   Years of education: 8   Highest education level: 8th grade  Occupational History   Not on file  Tobacco Use   Smoking status: Every Day    Packs/day: 1.50    Years: 30.00    Pack years: 45.00    Types: Cigarettes   Smokeless tobacco: Never  Vaping Use   Vaping Use: Never used  Substance and Sexual Activity   Alcohol use: Not Currently   Drug use: Yes    Types: Marijuana    Comment: Several times per week   Sexual activity: Yes  Other Topics Concern   Not on file  Social History Narrative   Right handed    Caffeine 2 cups per day    Lives at home with his mom    Social Determinants of Health   Financial Resource Strain: Not on file  Food Insecurity: Not on file  Transportation Needs: Not on file  Physical Activity: Not on file  Stress: Not on file  Social Connections: Not on file  Intimate Partner Violence: Not on file      PHYSICAL EXAM  Vitals:   01/27/21 1328  BP: 137/70  Pulse: 81   Weight: 198 lb (89.8 kg)  Height: 5' 10"  (1.778 m)   Body mass index is 28.41 kg/m.  Generalized: Well developed, in no acute distress  MMSE - Mini Mental State Exam 01/27/2021 06/30/2020 11/20/2019  Orientation to time 5 5 5   Orientation to Place 4 5 5   Registration 3 3 3   Attention/ Calculation 4 3 0  Recall 2 2 0  Language- name 2 objects 2 2 2   Language- repeat 1 1 1   Language- follow 3 step command 3 3 3   Language- read & follow direction 1 1 1   Write a sentence 1 1 1   Copy design 1 1 1   Total score 27 27 22     Neurological examination  Mentation: Alert oriented to time, place, history taking. Follows all commands speech and language fluent Cranial nerve II-XII: Pupils were equal round reactive to light. Extraocular movements were full, visual field were full on confrontational test. Facial sensation and strength were normal. Head turning and shoulder shrug  were normal and symmetric. Motor: The motor testing reveals 5 over 5 strength of all 4 extremities. Good symmetric motor tone is noted throughout.  Sensory: Sensory testing is intact to soft touch on all 4 extremities. No evidence of extinction is noted.  Coordination: Cerebellar testing reveals good finger-nose-finger and heel-to-shin bilaterally.  Gait and station: Gait is normal.  Reflexes: Deep tendon reflexes are symmetric and normal bilaterally.   DIAGNOSTIC DATA (LABS, IMAGING, TESTING) - I reviewed patient records, labs, notes, testing and imaging myself where available.  Lab Results  Component Value Date   WBC 24.6 (H) 08/16/2020   HGB 16.1 08/16/2020   HCT 46.2 08/16/2020   MCV 93.1 08/16/2020   PLT 272 08/16/2020      Component Value Date/Time   NA 134 (L) 08/17/2020 0639   NA 137 11/20/2019 1048   K 4.0 08/17/2020 0639   CL 103 08/17/2020 0639   CO2 24 08/17/2020 0639   GLUCOSE 83 08/17/2020 2836  BUN 17 08/17/2020 0639   BUN 8 11/20/2019 1048   CREATININE 1.09 08/17/2020 0639   CALCIUM 8.4 (L)  08/17/2020 0639   PROT 6.2 11/20/2019 1048   ALBUMIN 4.2 11/20/2019 1048   AST 20 11/20/2019 1048   ALT 14 11/20/2019 1048   ALKPHOS 80 11/20/2019 1048   BILITOT 0.4 11/20/2019 1048   GFRNONAA >60 08/17/2020 0639   GFRAA 105 11/20/2019 1048   Lab Results  Component Value Date   CHOL 226 (H) 09/11/2018   HDL 65 09/11/2018   LDLCALC 143 (H) 09/11/2018   TRIG 89 09/11/2018   CHOLHDL 3.5 09/11/2018   Lab Results  Component Value Date   HGBA1C 6.8 (H) 08/17/2020   Lab Results  Component Value Date   VELFYBOF75 102 11/20/2019   Lab Results  Component Value Date   TSH 10.242 (H) 08/17/2020      ASSESSMENT AND PLAN 46 y.o. year old male  has a past medical history of ADHD (attention deficit hyperactivity disorder), inattentive type, Chronic midline low back pain with right-sided sciatica (02/11/2018), Chronic right shoulder pain (02/11/2018), DKA (diabetic ketoacidosis) (08/17/2020), DOE (dyspnea on exertion) (05/19/2019), Hyperlipidemia due to type 1 diabetes mellitus (04/18/2018), Hypertension associated with diabetes (04/18/2018), Hypothyroidism, Major depressive disorder (10/09/2019), Moderate nonproliferative diabetic retinopathy of both eyes ([place]) (05/19/2019), Tobacco dependence (10/09/2019), and Type 1 diabetes mellitus with diabetic polyneuropathy (04/18/2018). here with:  1.  Reported memory disturbance 2.  Depression 3.  ADHD  -MRI of the brain was ordered by Dr. Jannifer Franklin after the last office visit, approval was gained through peer-to-peer, but the patient never scheduled it, I will again pursue this -He completed neuropsychological evaluation with Dr. Melvyn Novas, it was felt that his cognitive dysfunction was related to his psychiatric history, various medical conditions, and marijuana use (See impression below) -MMSE was 27/30 today -Has previously had blood work for B12, HIV, RPR that were unremarkable -I have discussed a referral to psychiatry for management of his mood  disorder, but he wants to discuss with PCP first -If MRI of the brain is unremarkable, he can follow-up here on an as-needed basis, I have scheduled a tentative 45-monthfollow-up to ensure MRI is completed, he will be followed by Dr. ARexene Albertssince Dr. WJannifer Franklinhas retired  Copied Dr. MMelvyn NovasNote 09/16/20: Overall, the most likely culprit for ongoing cognitive dysfunction is an underlying and currently untreated ADHD presentation, exacerbated by psychiatric distress (e.g., depression), various medical conditions (e.g., type I diabetes, hypertension, hyperlipidemia, hypothyroidism), and regular marijuana consumption. Ronald Banks far too young to strongly consider the presence of a neurodegenerative process and I do not see evidence for any other potential neurological cause for reported symptoms or patterns across neurocognitive testing.   SButler Denmark AGNP-C, DNP 01/27/2021, 1:41 PM [facility] [address] [place] [phone] I reviewed the above note and documentation by the Nurse Practitioner and agree with the history, exam, assessment and plan as outlined above. I was available for consultation. SStar Age MD, PhD [facility] ([facility]

## 2021-01-27 NOTE — Patient Instructions (Signed)
Will get MRI of the brain Discuss with your primary care about management of mental health issues See you back in 6 months

## 2021-01-27 NOTE — Telephone Encounter (Signed)
Patient needs MRI brain he never had it when Dr. Jannifer Franklin ordered this last time, do you need new order? Thanks

## 2021-02-01 ENCOUNTER — Other Ambulatory Visit: Payer: Self-pay | Admitting: Otolaryngology

## 2021-02-01 DIAGNOSIS — Q181 Preauricular sinus and cyst: Secondary | ICD-10-CM

## 2021-02-01 NOTE — Telephone Encounter (Signed)
Noted, [facility] pending faxed notes.

## 2021-02-01 NOTE — Telephone Encounter (Signed)
Since Dr. Jannifer Franklin is no longer here can you put a new order in

## 2021-02-01 NOTE — Addendum Note (Signed)
Addended by: Suzzanne Cloud on: 02/01/2021 12:19 PM   Modules accepted: Orders

## 2021-02-01 NOTE — Telephone Encounter (Signed)
Orders Placed This Encounter  Procedures   MR BRAIN WO CONTRAST   MRI ordered

## 2021-02-03 NOTE — Telephone Encounter (Signed)
[facility] Ronald Banks: [id number] (exp. 02/01/21 to 04/02/21) order sent to GI. They will reach out to the patient to schedule.

## 2021-02-04 ENCOUNTER — Other Ambulatory Visit: Payer: Medicaid Other

## 2021-02-04 ENCOUNTER — Ambulatory Visit: Payer: Medicaid Other | Admitting: Surgical

## 2021-02-11 ENCOUNTER — Ambulatory Visit: Payer: Medicaid Other | Admitting: Surgical

## 2021-02-16 ENCOUNTER — Ambulatory Visit: Payer: Medicaid Other

## 2021-02-21 ENCOUNTER — Ambulatory Visit: Admission: RE | Admit: 2021-02-21 | Payer: Medicaid Other | Source: Ambulatory Visit

## 2021-02-23 ENCOUNTER — Ambulatory Visit
Admission: EM | Admit: 2021-02-23 | Discharge: 2021-02-23 | Disposition: A | Discharge status: Home / Self Care | Payer: Medicaid Other

## 2021-02-23 ENCOUNTER — Encounter: Payer: Self-pay | Admitting: Emergency Medicine

## 2021-02-23 ENCOUNTER — Ambulatory Visit (INDEPENDENT_AMBULATORY_CARE_PROVIDER_SITE_OTHER): Payer: Medicaid Other

## 2021-02-23 ENCOUNTER — Other Ambulatory Visit: Payer: Self-pay

## 2021-02-23 DIAGNOSIS — R0781 Pleurodynia: Secondary | ICD-10-CM

## 2021-02-23 DIAGNOSIS — R079 Chest pain, unspecified: Secondary | ICD-10-CM

## 2021-02-23 DIAGNOSIS — R059 Cough, unspecified: Secondary | ICD-10-CM

## 2021-02-23 DIAGNOSIS — R0789 Other chest pain: Secondary | ICD-10-CM

## 2021-02-23 IMAGING — DX DG RIBS W/ CHEST 3+V*R*
3 series · 3 of 3 positions shown · non-contrast
Comparison: [DATE]; chest radiograph [DATE]

CLINICAL DATA: Right anterior rib pain following coughing episode.

EXAM:
RIGHT RIBS AND CHEST - 3+ VIEW

[chest pa]
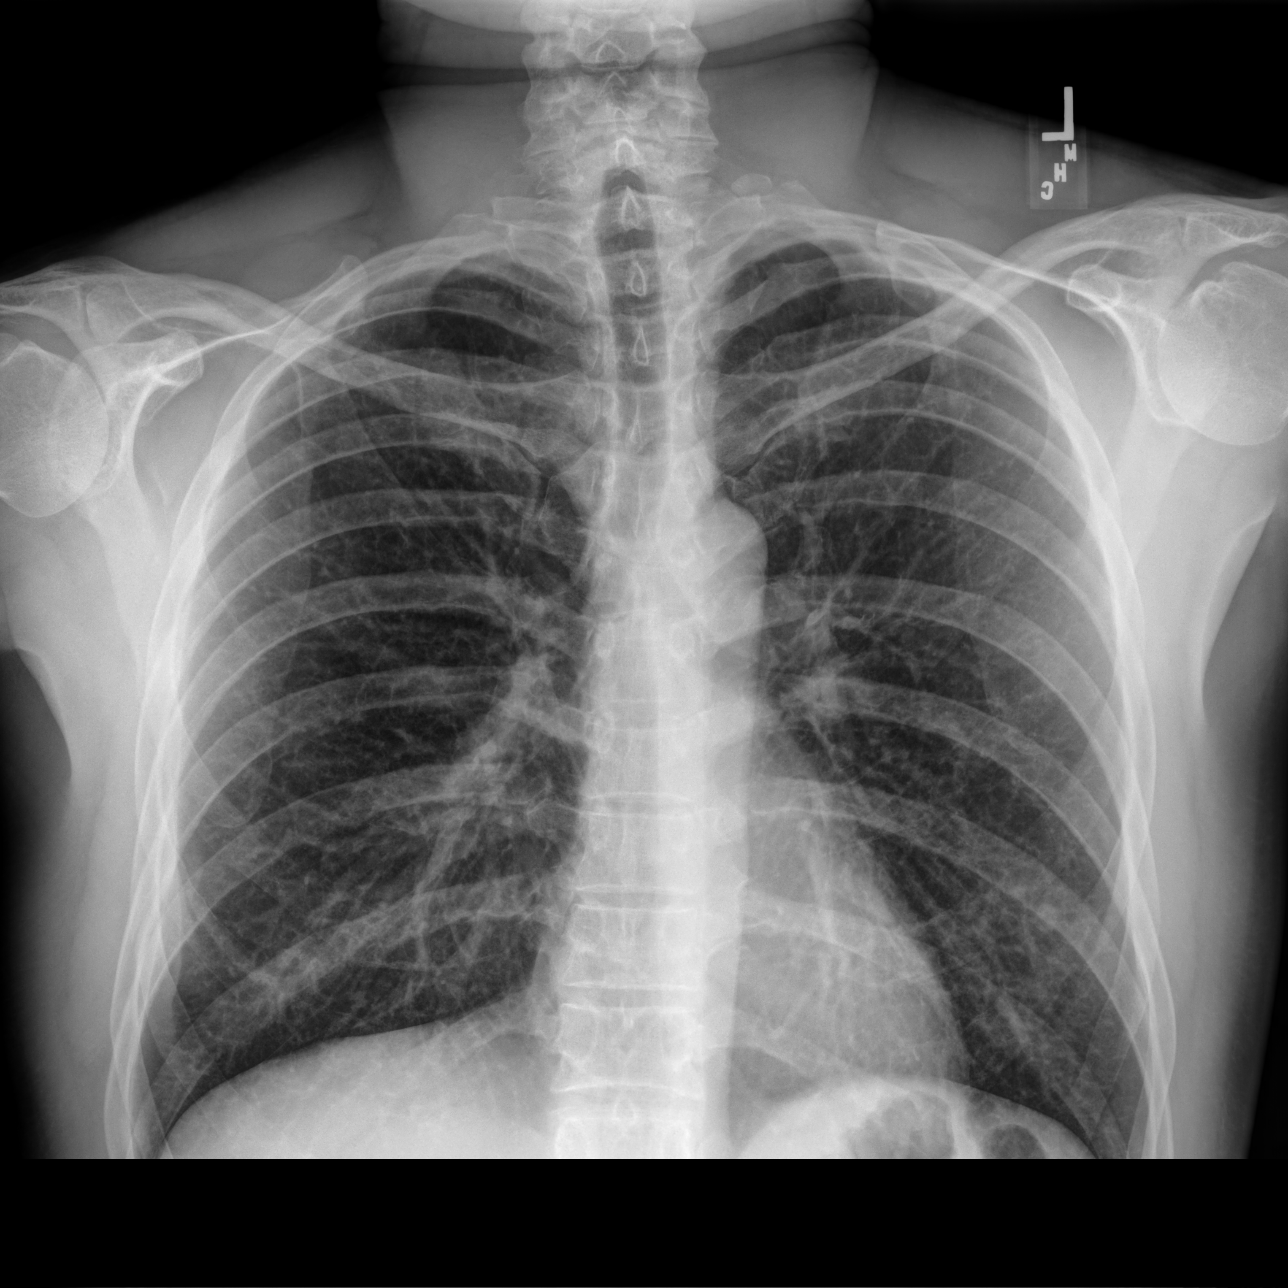

[hemithorax (ribs) ap]
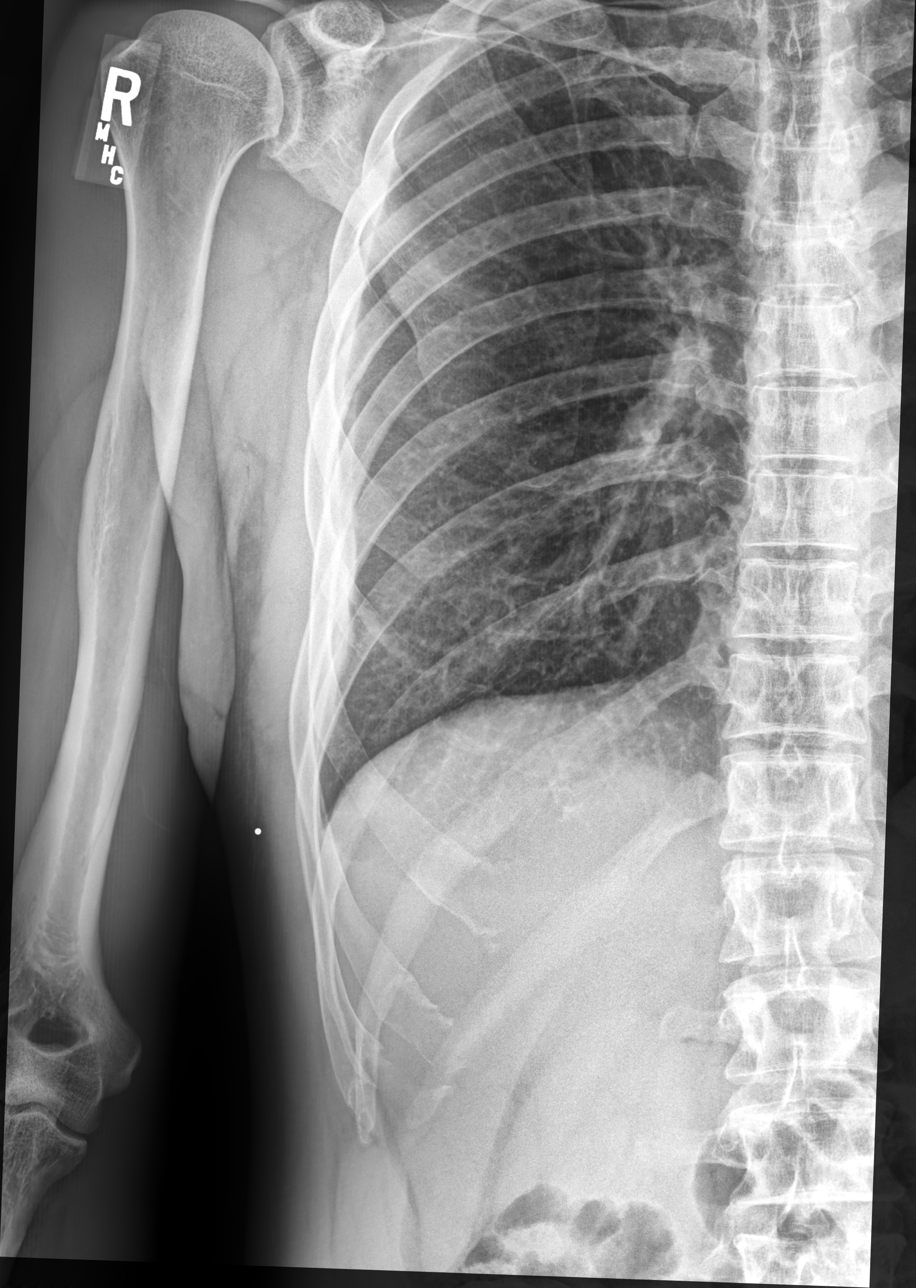

[hemithorax (ribs) mlo]
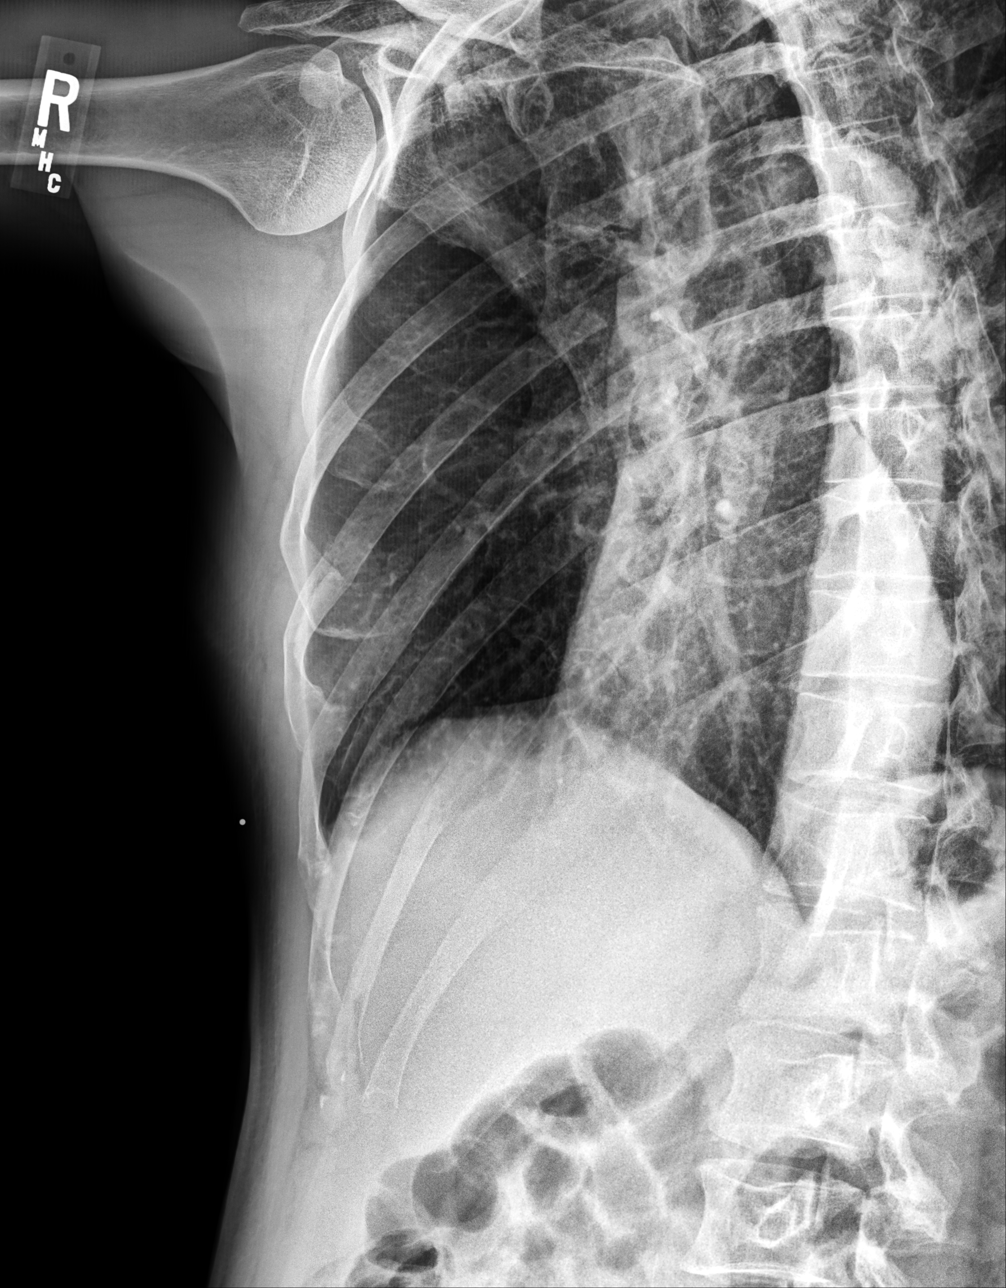

[3 of 3 positions shown; findings below may reference images not displayed]

FINDINGS: The provided dedicated right ribs images are degraded secondary to
artifact.

Unchanged cardiac silhouette and mediastinal contours.
Redemonstrated mild diffuse slightly nodular thickening of the
pulmonary interstitium. Nipple shadows overlie the lower lungs
bilaterally. No discrete focal airspace opacities. No pleural
effusion or pneumothorax. No evidence of edema.

No definite displaced right-sided rib fractures with special
attention paid to the area marked by the radiopaque BB. Regional
soft tissues appear normal. No radiopaque foreign body.
IMPRESSION: 1. Similar findings of chronic bronchitic change without
superimposed acute cardiopulmonary disease.
2. No definite displaced right-sided rib fractures with special
attention paid to the area demarcated by the radiopaque BB.

## 2021-02-23 NOTE — ED Provider Notes (Signed)
Ronald Banks    CSN: [id number] Arrival date & time: 02/23/21  1950      History   Chief Complaint Chief Complaint  Patient presents with   Chest Pain    HPI Ronald Banks is a 46 y.o. male.  Patient presents with pain in his right anterior chest and rib cage.  The pain has been ongoing for 2 to 3 weeks but became acutely worse this morning when he coughed and heard a "pop."  He states it is painful to take a deep breath and with palpation.  No treatments attempted at home.  Patient was seen here on 01/26/21; diagnosed with chest pain and rib pain; chest x-ray with ribs negative; treated with cyclobenzaprine; patient was instructed to follow-up with his PCP.  He states his pain improved after this visit but did not completely resolve.  He has not seen his PCP.  His medical history includes type 1 diabetes, hypertension, dyspnea on exertion, current everyday smoker, hypothyroidism, chronic low back pain, chronic shoulder pain, ADHD, depression.  The history is provided by the patient and medical records.   Past Medical History:  Diagnosis Date   ADHD (attention deficit hyperactivity disorder), inattentive type    Chronic midline low back pain with right-sided sciatica 02/11/2018   Chronic right shoulder pain 02/11/2018   DKA (diabetic ketoacidosis) 08/17/2020   DOE (dyspnea on exertion) 05/19/2019   Onset 2015/16 05/19/2019   Walked RA x two laps =  approx 563f @ brisk pace - stopped due to end of study with sats of 98 % at the end of the study and c/o sob s cp  > rec CPST next  - alpha one AT  Screen   05/19/19 MM  152  - CPST  06/30/19  FEV1 3.79 (93%) with ratio 0.73 and nl f/v loop: ex study  probabably  wnl when wt factored  In  - sob with ventilatory reserve suggested sub max effort and de   Hyperlipidemia due to type 1 diabetes mellitus 04/18/2018   Hypertension associated with diabetes 04/18/2018   Trial off acei 04/07/2019 due to cough > improved by 07/03/2019 with just a  typical smoker's rattle (no longer cough to point of gag/vomit)   Hypothyroidism    Major depressive disorder 10/09/2019   Moderate nonproliferative diabetic retinopathy of both eyes [place] 05/19/2019   Tobacco dependence 10/09/2019   Type 1 diabetes mellitus with diabetic polyneuropathy 04/18/2018    Patient Active Problem List   Diagnosis Date Noted   Memory difficulties 01/27/2021   ADHD (attention deficit hyperactivity disorder), inattentive type    DKA (diabetic ketoacidosis) 08/17/2020   Tobacco dependence 10/09/2019   Major depressive disorder 10/09/2019   Moderate nonproliferative diabetic retinopathy of both eyes 05/19/2019   DOE (dyspnea on exertion) 05/19/2019   Upper airway cough syndrome 04/07/2019   Hypertension associated with diabetes 04/18/2018   Hyperlipidemia due to type 1 diabetes mellitus 04/18/2018   Type 1 diabetes mellitus with diabetic polyneuropathy 04/18/2018   Hypothyroidism 02/11/2018   Chronic right shoulder pain 02/11/2018   Chronic midline low back pain with right-sided sciatica 02/11/2018    Past Surgical History:  Procedure Laterality Date   DENTAL SURGERY     EAR CYST EXCISION Right 03/24/2020   Procedure: EXCISION PRE-AURICULAR CYST;  Surgeon: VCarloyn Manner MD;  [facility] [facility]  Service: ENT;  Laterality: Right;  diabetic insulin pump   PREAURICULAR CYST EXCISION Left 07/07/2020   Procedure: EXCISION PREAURICULAR CYST ADULT;  Surgeon: Carloyn Manner, MD;  Location: [facility];  Service: ENT;  Laterality: Left;       Home Medications    Prior to Admission medications   Medication Sig Start Date End Date Taking? Authorizing Provider  levothyroxine (SYNTHROID) 125 MCG tablet Take 2 tablets by mouth daily. 02/09/21  Yes [provider]  ACCU-CHEK FASTCLIX LANCETS MISC Use to check blood sugar up to 5 times daily. 03/05/18   Fulp, Cammie, MD  Blood Glucose Monitoring Suppl (ACCU-CHEK GUIDE) w/Device KIT 1  kit by Does not apply route 5 (five) times daily. 03/05/18   Fulp, Cammie, MD  Continuous Blood Gluc Sensor (DEXCOM G6 SENSOR) MISC SMARTSIG:Topical Every 10 Days 06/02/20   [provider]  Continuous Blood Gluc Transmit (DEXCOM G6 TRANSMITTER) MISC Use to monitor blood sugar.  Replace every 3 months 03/24/20   [provider]  cyclobenzaprine (FLEXERIL) 5 MG tablet Take 1 tablet (5 mg total) by mouth 3 (three) times daily as needed for muscle spasms. 01/26/21   Sharion Balloon, NP  FLUoxetine (PROZAC) 10 MG capsule TAKE 1 CAPSULE BY MOUTH EVERY DAY 12/13/20   Ladell Pier, MD  fluticasone [facility]) 50 MCG/ACT nasal spray Place 2 sprays into both nostrils daily. 07/30/19   Argentina Donovan, PA-C  gabapentin (NEURONTIN) 600 MG tablet Take 1 tablet (600 mg total) by mouth 3 (three) times daily. 05/04/20   Ladell Pier, MD  glucose blood (ACCU-CHEK GUIDE) test strip USE TO TEST BLOOD SUGAR UP TO 5 TIMES DAILY 09/03/20   Ladell Pier, MD  insulin aspart (NOVOLOG) 100 UNIT/ML injection Up to 100 units daily in pump 08/12/20   [provider]  Insulin Detemir (LEVEMIR FLEXTOUCH) 100 UNIT/ML Pen Inject into the skin. 04/18/18 01/26/21  [provider]  Insulin Human (INSULIN PUMP) SOLN Inject into the skin.    [provider]  INSULIN SYRINGE .5CC/29G (B-D INSULIN SYRINGE) 29G X 1/2" 0.5 ML MISC USE TO INJECT INSULIN EVERY DAY. 05/28/19   Fulp, Cammie, MD  lidocaine (LIDODERM) 5 % Place 1 patch onto the skin daily. Remove & Discard patch within 12 hours or as directed by MD 05/12/20   Raulkar, Clide Deutscher, MD  meloxicam (MOBIC) 15 MG tablet Take 1 tablet by mouth daily. 05/29/20   [provider]    Family History Family History  Problem Relation Age of Onset   Diabetes Mother    Cancer Mother        Brain, breast    Diabetes Sister    Diabetes Maternal Grandmother     Social History Social History   Tobacco Use   Smoking status: Every Day     Packs/day: 1.50    Years: 30.00    Pack years: 45.00    Types: Cigarettes   Smokeless tobacco: Never  Vaping Use   Vaping Use: Never used  Substance Use Topics   Alcohol use: Not Currently   Drug use: Yes    Types: Marijuana    Comment: Several times per week     Allergies   Patient has no known allergies.   Review of Systems Review of Systems  Constitutional:  Negative for chills and fever.  Respiratory:  Negative for cough and shortness of breath.   Cardiovascular:  Positive for chest pain. Negative for palpitations.  Gastrointestinal:  Negative for abdominal pain, nausea and vomiting.  Skin:  Negative for color change, rash and wound.  All other systems reviewed and are negative.  Physical Exam Triage Vital Signs ED Triage Vitals  Enc Vitals Group     BP      Pulse      Resp      Temp      Temp src      SpO2      Weight      Height      Head Circumference      Peak Flow      Pain Score      Pain Loc      Pain Edu?      Excl. in [place]?    No data found.  Updated Vital Signs BP 138/70 (BP Location: Left Arm)    Pulse 63    Temp 98.3 F (36.8 C) (Oral)    Resp 18    SpO2 95%   Visual Acuity Right Eye Distance:   Left Eye Distance:   Bilateral Distance:    Right Eye Near:   Left Eye Near:    Bilateral Near:     Physical Exam Vitals and nursing note reviewed.  Constitutional:      General: He is not in acute distress.    Appearance: He is well-developed. He is not ill-appearing.  HENT:     Mouth/Throat:     Mouth: Mucous membranes are moist.  Cardiovascular:     Rate and Rhythm: Normal rate and regular rhythm.     Heart sounds: Normal heart sounds.  Pulmonary:     Effort: Pulmonary effort is normal. No respiratory distress.     Breath sounds: Normal breath sounds.  Chest:    Abdominal:     Palpations: Abdomen is soft.     Tenderness: There is no abdominal tenderness.  Musculoskeletal:        General: Tenderness present. No swelling or  deformity. Normal range of motion.     Cervical back: Neck supple.  Skin:    General: Skin is warm and dry.     Capillary Refill: Capillary refill takes less than 2 seconds.     Findings: No bruising, erythema, lesion or rash.  Neurological:     General: No focal deficit present.     Mental Status: He is alert and oriented to person, place, and time.     Sensory: No sensory deficit.     Motor: No weakness.     Gait: Gait normal.  Psychiatric:        Mood and Affect: Mood normal.        Behavior: Behavior normal.     UC Treatments / Results  Labs (all labs ordered are listed, but only abnormal results are displayed) Labs Reviewed - No data to display  EKG   Radiology DG Ribs Unilateral W/Chest Right  Result Date: 02/23/2021 CLINICAL DATA:  Right anterior rib pain following coughing episode. EXAM: RIGHT RIBS AND CHEST - 3+ VIEW COMPARISON:  01/26/2021; chest radiograph 08/16/2020 FINDINGS: The provided dedicated right ribs images are degraded secondary to artifact. Unchanged cardiac silhouette and mediastinal contours. Redemonstrated mild diffuse slightly nodular thickening of the pulmonary interstitium. Nipple shadows overlie the lower lungs bilaterally. No discrete focal airspace opacities. No pleural effusion or pneumothorax. No evidence of edema. No definite displaced right-sided rib fractures with special attention paid to the area marked by the radiopaque BB. Regional soft tissues appear normal. No radiopaque foreign body. IMPRESSION: 1. Similar findings of chronic bronchitic change without superimposed acute cardiopulmonary disease. 2. No definite displaced right-sided rib fractures with special attention paid  to the area demarcated by the radiopaque BB. Electronically Signed   By: Sandi Mariscal M.D.   On: 02/23/2021 08:45    Procedures Procedures (including critical care time)  Medications Ordered in UC Medications - No data to display  Initial Impression / Assessment and Plan  / UC Course  I have reviewed the triage vital signs and the nursing notes.  Pertinent labs & imaging results that were available during my care of the patient were reviewed by me and considered in my medical decision making (see chart for details).    Chest pain, rib pain.  Patient is acutely tender to palpation of his right anterior rib cage.  EKG shows sinus rhythm, rate 62, no ST elevation, compared to previous from 01/26/2021.  CXR does not show any acute disease or rib fracture.  Instructed patient to take Tylenol or ibuprofen as needed for discomfort.  Instructed him to follow up with his PCP.  ED precautions discussed.  He agrees to plan of care.     Final Clinical Impressions(s) / UC Diagnoses   Final diagnoses:  Chest pain, unspecified type  Rib pain on right side     Discharge Instructions      Follow up with your primary care provider.        ED Prescriptions   None    I have reviewed the PDMP during this encounter.   Sharion Balloon, NP 02/23/21 [phone]

## 2021-02-23 NOTE — Discharge Instructions (Addendum)
Follow up with your primary care provider.

## 2021-02-23 NOTE — ED Triage Notes (Signed)
Pt here for right side chest pain for over 2 weeks. Pt states he coughed and felt a "pop" this morning.

## 2021-03-01 ENCOUNTER — Inpatient Hospital Stay: Admission: RE | Admit: 2021-03-01 | Payer: Medicaid Other | Source: Ambulatory Visit

## 2021-03-07 ENCOUNTER — Encounter: Payer: Self-pay | Admitting: Internal Medicine

## 2021-03-07 ENCOUNTER — Ambulatory Visit: Payer: Medicaid Other | Attending: Internal Medicine | Admitting: Internal Medicine

## 2021-03-07 VITALS — BP 133/78 | HR 61 | Resp 16 | Wt 196.0 lb

## 2021-03-07 DIAGNOSIS — E1042 Type 1 diabetes mellitus with diabetic polyneuropathy: Secondary | ICD-10-CM | POA: Diagnosis not present

## 2021-03-07 DIAGNOSIS — I10 Essential (primary) hypertension: Secondary | ICD-10-CM | POA: Diagnosis not present

## 2021-03-07 DIAGNOSIS — F320 Major depressive disorder, single episode, mild: Secondary | ICD-10-CM

## 2021-03-07 DIAGNOSIS — R0789 Other chest pain: Secondary | ICD-10-CM

## 2021-03-07 DIAGNOSIS — F17200 Nicotine dependence, unspecified, uncomplicated: Secondary | ICD-10-CM

## 2021-03-07 DIAGNOSIS — F909 Attention-deficit hyperactivity disorder, unspecified type: Secondary | ICD-10-CM

## 2021-03-07 MED ORDER — DICLOFENAC SODIUM 1 % EX GEL: 2.0000 g | Freq: Four times a day (QID) | CUTANEOUS | 0 refills | Status: DC

## 2021-03-07 NOTE — Progress Notes (Signed)
Patient ID: Ronald Banks, male    DOB: 05/03/1975  MRN: [id number]  CC: Hospitalization Follow-up (Went to the urgent care on 1/4 and 2/1 for chest pain on the right side. Pt states feels like something is pulling. Pt states when coughs the pain is worse and he is not able to lay on right side ) and Hypertension   Subjective: Ronald Banks is a 46 y.o. male who presents for chronic ds management His concerns today include:  Patient with history of diabetes type 1 (insulin pump) with moderate NPDR and neuropathy, HL, HTN, tob dep, hypothyroid, UACS (Dr. Melvyn Novas - on Gabapentin), emphysema on CT 08/2019,tob dep, MDD, memory issues  Patient seen at urgent care on 01/26/2021 with right-sided anterior rib cage pain.  Patient tells me that his 85 pound dog jumped up on him while he was sleeping on pain started immediately after that.  Rib x-ray negative for fracture.  EKG without acute finding.  Pain assessed to be musculoskeletal in nature.  Patient advised to take Tylenol or ibuprofen.  Prescribed Flexeril..  Return to urgent care 02/23/2021 again with the same pain that had increased that morning after he coughed.  He felt something pop.  Chest x-ray with right-sided rib images showed no acute findings or fractures. -Today he reports that the pain is a little better.  It is worse when he coughs or takes a deep breath.  He has not had any long distance traveling.  No swelling in the legs.  No shortness of breath.  .    DM: He saw his endocrinologist Dr. Honor Junes 03/01/2021.  A1c was 6.8.  LDL was 74.  Patient was started on atorvastatin 10 mg daily which she has been taking.  TSH was normal.  He reports compliance with taking levothyroxine.  Urine microalbumin was negative. -AST was elevated at 79.  Tob:  smoking 1.5 pk/day.  He is not ready to quit smoking.  Blood pressure has been observed off medications.  On recent appointment with the endocrinologist, blood pressure was 124/72.  Memory Issues: Had  neuropsychiatric evaluation done by psychologist Dr. Melvyn Novas in August of last year.  His assessment was that patient memory issues are due to MDD and ADHD.  Patient was to have an MRI to rule out any organic brain issues but he never did have it done.  He saw neurology NP last month and she reordered the MRI but patient no showed. -I refilled Prozac for him in December.  However patient states he is not taking it because the pharmacy never called him for it.  He does not want to be on it anymore.  He is agreeable to referral to psychiatry and prefers for it to be in [place].  Patient Active Problem List   Diagnosis Date Noted   Memory difficulties 01/27/2021   ADHD (attention deficit hyperactivity disorder), inattentive type    DKA (diabetic ketoacidosis) 08/17/2020   Tobacco dependence 10/09/2019   Major depressive disorder 10/09/2019   Moderate nonproliferative diabetic retinopathy of both eyes 05/19/2019   DOE (dyspnea on exertion) 05/19/2019   Upper airway cough syndrome 04/07/2019   Hypertension associated with diabetes 04/18/2018   Hyperlipidemia due to type 1 diabetes mellitus 04/18/2018   Type 1 diabetes mellitus with diabetic polyneuropathy 04/18/2018   Hypothyroidism 02/11/2018   Chronic right shoulder pain 02/11/2018   Chronic midline low back pain with right-sided sciatica 02/11/2018     Current Outpatient Medications on File Prior to Visit  Medication  Sig Dispense Refill   ACCU-CHEK FASTCLIX LANCETS MISC Use to check blood sugar up to 5 times daily. 102 each 11   Blood Glucose Monitoring Suppl (ACCU-CHEK GUIDE) w/Device KIT 1 kit by Does not apply route 5 (five) times daily. 1 kit 0   Continuous Blood Gluc Sensor (DEXCOM G6 SENSOR) MISC SMARTSIG:Topical Every 10 Days     Continuous Blood Gluc Transmit (DEXCOM G6 TRANSMITTER) MISC Use to monitor blood sugar.  Replace every 3 months     cyclobenzaprine (FLEXERIL) 5 MG tablet Take 1 tablet (5 mg total) by mouth 3 (three) times  daily as needed for muscle spasms. 15 tablet 0   FLUoxetine (PROZAC) 10 MG capsule TAKE 1 CAPSULE BY MOUTH EVERY DAY 90 capsule 1   fluticasone (FLONASE) 50 MCG/ACT nasal spray Place 2 sprays into both nostrils daily. 16 g 6   gabapentin (NEURONTIN) 600 MG tablet Take 1 tablet (600 mg total) by mouth 3 (three) times daily. 90 tablet 3   glucose blood (ACCU-CHEK GUIDE) test strip USE TO TEST BLOOD SUGAR UP TO 5 TIMES DAILY 100 strip 11   insulin aspart (NOVOLOG) 100 UNIT/ML injection Up to 100 units daily in pump     Insulin Detemir (LEVEMIR FLEXTOUCH) 100 UNIT/ML Pen Inject into the skin.     Insulin Human (INSULIN PUMP) SOLN Inject into the skin.     INSULIN SYRINGE .5CC/29G (B-D INSULIN SYRINGE) 29G X 1/2" 0.5 ML MISC USE TO INJECT INSULIN EVERY DAY. 100 each 2   levothyroxine (SYNTHROID) 125 MCG tablet Take 2 tablets by mouth daily.     lidocaine (LIDODERM) 5 % Place 1 patch onto the skin daily. Remove & Discard patch within 12 hours or as directed by MD 30 patch 0   meloxicam (MOBIC) 15 MG tablet Take 1 tablet by mouth daily.     No current facility-administered medications on file prior to visit.    No Known Allergies  Social History   Socioeconomic History   Marital status: Single    Spouse name: Not on file   Number of children: Not on file   Years of education: 8   Highest education level: 8th grade  Occupational History   Not on file  Tobacco Use   Smoking status: Every Day    Packs/day: 1.50    Years: 30.00    Pack years: 45.00    Types: Cigarettes   Smokeless tobacco: Never  Vaping Use   Vaping Use: Never used  Substance and Sexual Activity   Alcohol use: Not Currently   Drug use: Yes    Types: Marijuana    Comment: Several times per week   Sexual activity: Yes  Other Topics Concern   Not on file  Social History Narrative   Right handed    Caffeine 2 cups per day    Lives at home with his mom    Social Determinants of Health   Financial Resource Strain:  Not on file  Food Insecurity: Not on file  Transportation Needs: Not on file  Physical Activity: Not on file  Stress: Not on file  Social Connections: Not on file  Intimate Partner Violence: Not on file    Family History  Problem Relation Age of Onset   Diabetes Mother    Cancer Mother        Brain, breast    Diabetes Sister    Diabetes Maternal Grandmother     Past Surgical History:  Procedure Laterality Date   DENTAL  SURGERY     EAR CYST EXCISION Right 03/24/2020   Procedure: EXCISION PRE-AURICULAR CYST;  Surgeon: Carloyn Manner, MD;  Location: [place];  Service: ENT;  Laterality: Right;  diabetic insulin pump   PREAURICULAR CYST EXCISION Left 07/07/2020   Procedure: EXCISION PREAURICULAR CYST ADULT;  Surgeon: Carloyn Manner, MD;  Location: [place];  Service: ENT;  Laterality: Left;    ROS: Review of Systems Negative except as stated above  PHYSICAL EXAM: BP 133/78    Pulse 61    Resp 16    Wt 196 lb (88.9 kg)    SpO2 97%    BMI 28.12 kg/m   Physical Exam   General appearance - alert, well appearing, and in no distress Mental status - normal mood, behavior, speech, dress, motor activity, and thought processes Neck - supple, no significant adenopathy Chest - clear to auscultation, no wheezes, rales or rhonchi, symmetric air entry.  No splinting noted on deep breathing. Heart - normal rate, regular rhythm, normal S1, S2, no murmurs, rubs, clicks or gallops Extremities - peripheral pulses normal, no pedal edema, no clubbing or cyanosis MSK: Mild tenderness on palpation over the right anterior lateral rib cage just below the breast Depression screen [facility] 2/9 03/07/2021 09/03/2020 05/12/2020 05/04/2020 11/20/2019  Decreased Interest _0 Down, Depressed, Hopeless _1 PHQ - 2 Score _2 Altered sleeping 2 0 _3 Tired, decreased energy _4 Change in appetite _5 Feeling bad or failure about yourself  _6 Trouble concentrating _7 Moving slowly or fidgety/restless 0 0 2 1 0  Suicidal thoughts 0 0 0 1 0  PHQ-9 Score _8 Difficult doing work/chores - - - - -  Some recent data might be hidden    CMP Latest Ref Rng & Units 08/17/2020 08/16/2020 08/16/2020  Glucose 70 - 99 mg/dL 83 378(H) 354(H)  BUN 6 - 20 mg/dL 17 21(H) 17  Creatinine 0.61 - 1.24 mg/dL 1.09 1.30(H) 1.22  Sodium 135 - 145 mmol/L 134(L) 129(L) 130(L)  Potassium 3.5 - 5.1 mmol/L 4.0 5.1 5.9(H)  Chloride 98 - 111 mmol/L 103 93(L) 94(L)  CO2 22 - 32 mmol/L 24 16(L) 22  Calcium 8.9 - 10.3 mg/dL 8.4(L) 9.2 8.9  Total Protein 6.0 - 8.5 g/dL - - -  Total Bilirubin 0.0 - 1.2 mg/dL - - -  Alkaline Phos 44 - 121 IU/L - - -  AST 0 - 40 IU/L - - -  ALT 0 - 44 IU/L - - -   Lipid Panel     Component Value Date/Time   CHOL 226 (H) 09/11/2018 0954   TRIG 89 09/11/2018 0954   HDL 65 09/11/2018 0954   CHOLHDL 3.5 09/11/2018 0954   LDLCALC 143 (H) 09/11/2018 0954    CBC    Component Value Date/Time   WBC 24.6 (H) 08/16/2020 1248   RBC 4.96 08/16/2020 1248   HGB 16.1 08/16/2020 1248   HGB 16.1 11/20/2019 1048   HCT 46.2 08/16/2020 1248   HCT 47.2 11/20/2019 1048   PLT 272 08/16/2020 1248   PLT 368 11/20/2019 1048   MCV 93.1 08/16/2020 1248   MCV 93 11/20/2019 1048   MCH 32.5 08/16/2020 1248   MCHC 34.8 08/16/2020 1248   RDW 12.7 08/16/2020 1248  RDW 12.1 11/20/2019 1048   LYMPHSABS 2.7 05/19/2019 1528   LYMPHSABS 2.5 09/11/2018 0954   MONOABS 0.5 05/19/2019 1528   EOSABS 0.1 05/19/2019 1528   EOSABS 0.2 09/11/2018 0954   BASOSABS 0.1 05/19/2019 1528   BASOSABS 0.1 09/11/2018 0954    ASSESSMENT AND PLAN: 1. Other chest pain Musculoskeletal in nature.  Recommend using Voltaren gel as needed. - diclofenac Sodium (VOLTAREN) 1 % GEL; Apply 2 g topically 4 (four) times daily.  Dispense: 100 g; Refill: 0  2. Tobacco dependence Strongly advised to quit.  He is aware of health risks associated with  cigarette smoking but is not ready to give a trial of quitting.  3. DM type 1 with diabetic peripheral neuropathy (HCC) Last A1c was at goal.  He is followed by endocrinology. - Ambulatory referral to Ophthalmology  4. Essential hypertension Close to goal off meds.  DASH diet discussed and encouraged.  5. Attention deficit hyperactivity disorder (ADHD), unspecified ADHD type I will send a message to our referral coordinator to see if she can get him in with a provider in [place].  If not he is agreeable to seeing someone - Ambulatory referral to Psychiatry  6. Major depressive disorder, single episode, mild ([place]) - Ambulatory referral to Psychiatry   Patient was given the opportunity to ask questions.  Patient verbalized understanding of the plan and was able to repeat key elements of the plan.   Orders Placed This Encounter  Procedures   Ambulatory referral to Psychiatry   Ambulatory referral to Ophthalmology     Requested Prescriptions   Signed Prescriptions Disp Refills   diclofenac Sodium (VOLTAREN) 1 % GEL 100 g 0    Sig: Apply 2 g topically 4 (four) times daily.    Return in about 4 months (around 07/05/2021).  Karle Plumber, MD, FACP

## 2021-03-25 ENCOUNTER — Telehealth: Payer: Self-pay | Admitting: Internal Medicine

## 2021-03-25 NOTE — Telephone Encounter (Signed)
-----   Message from Ena Dawley sent at 03/25/2021 11:28 AM EST ----- ?Regarding: RE: psychaitry referral ?Per    Psyquiatry  ?on 03-16-21 @ 04:40 left message for patient that went over referral process. Pt paperwork due by 04-14-21 or referral will be closed. Pt was emailed and mailed new patient paperwork ?----- Message ----- ?From: Ladell Pier, MD ?Sent: 03/07/2021   5:46 PM EST ?To: Ena Dawley ?Subject: psychaitry referral                           ? ?Patient prefers to see a provider in [facility].  If none is available there he is okay with being referred to a psychiatrist in [place]. ? ? ?

## 2021-05-07 ENCOUNTER — Other Ambulatory Visit: Payer: Self-pay | Admitting: Internal Medicine

## 2021-05-07 DIAGNOSIS — E1042 Type 1 diabetes mellitus with diabetic polyneuropathy: Secondary | ICD-10-CM

## 2021-05-09 NOTE — Telephone Encounter (Signed)
Requested Prescriptions  ?Pending Prescriptions Disp Refills  ?? ACCU-CHEK GUIDE test strip [Pharmacy Med Name: ACCU-CHEK GUIDE TEST STRIP] 100 strip 11  ?  Sig: USE TO TEST BLOOD SUGAR UP TO 5 TIMES DAILY  ?  ? Endocrinology: Diabetes - Testing Supplies Passed - 05/07/2021 11:07 AM  ?  ?  Passed - Valid encounter within last 12 months  ?  Recent Outpatient Visits   ?      ? 2 months ago Other chest pain  ? [place] Karle Plumber B, MD  ? 8 months ago DM type 1 with diabetic peripheral neuropathy [facility])  ? [place] Karle Plumber B, MD  ? 1 year ago Chronic midline low back pain without sciatica  ? [place] Karle Plumber B, MD  ? 1 year ago Acute right-sided low back pain without sciatica  ? [place] Karle Plumber B, MD  ? 1 year ago Acute right-sided low back pain without sciatica  ? [place] Ladell Pier, MD  ?  ?  ? ?  ?  ?  ? ? ?

## 2021-06-18 ENCOUNTER — Other Ambulatory Visit: Payer: Self-pay | Admitting: Internal Medicine

## 2021-08-04 ENCOUNTER — Encounter: Payer: Self-pay | Admitting: Neurology

## 2021-08-04 ENCOUNTER — Ambulatory Visit: Payer: Medicaid Other | Admitting: Neurology

## 2021-08-04 NOTE — Progress Notes (Deleted)
PATIENT: Ronald Banks DOB: 1975/03/03  REASON FOR VISIT: Follow up for memory  HISTORY FROM: Patient PRIMARY NEUROLOGIST: Dr. Rexene Alberts since Dr. Jannifer Banks retired  HISTORY OF PRESENT ILLNESS: Today 08/04/21 [name] is here today for follow-up.  MRI of the brain was ordered, this is the 2nd time it was ordered.  He did not have it, no showed his MRI appointment 03/01/21.  Update 01/27/21 SS: [name] here today for follow-up with history of memory issues.  Had neuropsychological evaluation in August 2022, most likely culprit for ongoing cognitive function is underlying and untreated ADHD exacerbated by psychiatric distress, various medical conditions, and regular marijuana consumption.  MRI of the brain was ordered, but he did not schedule it. Here today, still living with his mom, who is in her 58's. Driving a car. 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 Still trouble with the short term, misplacing things. His mom does his finances. Uses his phone my chart reminders. Doesn't see psychiatry for ADHD. Uses pill box, his mom helps him, often forgets to take his pills. He walks for exercise. He sleeps well at night. He feels rested in the morning. Here today alone. MMSE 27/30.  HISTORY  06/30/2020 Dr. Jannifer Banks: [name] is a 46 year old right-handed white male with a history of type 1 diabetes, diabetic peripheral neuropathy, carpal tunnel syndrome, hypertension, dyslipidemia, hypothyroidism, depression, and ongoing tobacco abuse with emphysema.  The patient is sent here with a 2 or 3-year history of memory issues that he claims has gradually worsened over time.  He indicates that he has poor short-term memory, he will forget to take medications, he will misplace things about the house frequently.  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  He tried to work 3 to  4 months ago, but he could not continue.  The patient does not do the finances, and he never has.  He lives with his mother who does this for him.  The patient is able to operate a motor vehicle without difficulty, he uses GPS to get around.  Occasionally he may forget where he is going.  He has some left-sided back pain and sciatica down the left leg, a recent MRI of the lumbar spine did not show a source of the pain.  He claims that he sleeps well at night and he has a good energy level during the day.  He has been treated for depression previously, he claims that this is moderately well controlled currently.  He denies any focal numbness or weakness of the face, arms, legs.  He denies any balance issues or falls.  He denies issues controlling the bowels or the bladder.  He reports no headaches or dizziness.  He has had occasional episodes of low blood sugar, but he has a subcutaneous monitoring system that will warn him when his blood sugars dropping.  He comes to this office for an evaluation.  REVIEW OF SYSTEMS: Out of a complete 14 system review of symptoms, the patient complains only of the following symptoms, and all other reviewed systems are negative.  See HPI  ALLERGIES: No Known Allergies  HOME MEDICATIONS: Outpatient Medications Prior to Visit  Medication Sig Dispense Refill   ACCU-CHEK FASTCLIX LANCETS MISC Use to check blood sugar up to 5 times daily. 102 each 11  ACCU-CHEK GUIDE test strip USE TO TEST BLOOD SUGAR UP TO 5 TIMES DAILY 100 strip 11   atorvastatin (LIPITOR) 10 MG tablet Take 10 mg by mouth daily.     Blood Glucose Monitoring Suppl (ACCU-CHEK GUIDE) w/Device KIT 1 kit by Does not apply route 5 (five) times daily. 1 kit 0   Continuous Blood Gluc Sensor (DEXCOM G6 SENSOR) MISC SMARTSIG:Topical Every 10 Days     Continuous Blood Gluc Transmit (DEXCOM G6 TRANSMITTER) MISC Use to monitor blood sugar.  Replace every 3 months     cyclobenzaprine (FLEXERIL) 5 MG tablet Take 1  tablet (5 mg total) by mouth 3 (three) times daily as needed for muscle spasms. 15 tablet 0   diclofenac Sodium (VOLTAREN) 1 % GEL Apply 2 g topically 4 (four) times daily. 100 g 0   fluticasone (FLONASE) 50 MCG/ACT nasal spray Place 2 sprays into both nostrils daily. 16 g 6   gabapentin (NEURONTIN) 600 MG tablet Take 1 tablet (600 mg total) by mouth 3 (three) times daily. 90 tablet 3   insulin aspart (NOVOLOG) 100 UNIT/ML injection Up to 100 units daily in pump     Insulin Detemir (LEVEMIR FLEXTOUCH) 100 UNIT/ML Pen Inject into the skin.     Insulin Human (INSULIN PUMP) SOLN Inject into the skin.     INSULIN SYRINGE .5CC/29G (B-D INSULIN SYRINGE) 29G X 1/2" 0.5 ML MISC USE TO INJECT INSULIN EVERY DAY. 100 each 2   levothyroxine (SYNTHROID) 125 MCG tablet Take 2 tablets by mouth daily.     lidocaine (LIDODERM) 5 % Place 1 patch onto the skin daily. Remove & Discard patch within 12 hours or as directed by MD 30 patch 0   meloxicam (MOBIC) 15 MG tablet Take 1 tablet by mouth daily.     No facility-administered medications prior to visit.    PAST MEDICAL HISTORY: Past Medical History:  Diagnosis Date   ADHD (attention deficit hyperactivity disorder), inattentive type    Chronic midline low back pain with right-sided sciatica 02/11/2018   Chronic right shoulder pain 02/11/2018   DKA (diabetic ketoacidosis) 08/17/2020   DOE (dyspnea on exertion) 05/19/2019   Onset 2015/16 05/19/2019   Walked RA x two laps =  approx 579f @ brisk pace - stopped due to end of study with sats of 98 % at the end of the study and c/o sob s cp  > rec CPST next  - alpha one AT  Screen   05/19/19 MM  152  - CPST  06/30/19  FEV1 3.79 (93%) with ratio 0.73 and nl f/v loop: ex study  probabably  wnl when wt factored  In  - sob with ventilatory reserve suggested sub max effort and de   Hyperlipidemia due to type 1 diabetes mellitus 04/18/2018   Hypertension associated with diabetes 04/18/2018   Trial off acei 04/07/2019 due to  cough > improved by 07/03/2019 with just a typical smoker's rattle (no longer cough to point of gag/vomit)   Hypothyroidism    Major depressive disorder 10/09/2019   Moderate nonproliferative diabetic retinopathy of both eyes ([place] 05/19/2019   Tobacco dependence 10/09/2019   Type 1 diabetes mellitus with diabetic polyneuropathy 04/18/2018    PAST SURGICAL HISTORY: Past Surgical History:  Procedure Laterality Date   DENTAL SURGERY     EAR CYST EXCISION Right 03/24/2020   Procedure: EXCISION PRE-AURICULAR CYST;  Surgeon: VCarloyn Manner MD;  [facility]  Service: ENT;  Laterality: Right;  diabetic insulin pump  PREAURICULAR CYST EXCISION Left 07/07/2020   Procedure: EXCISION PREAURICULAR CYST ADULT;  Surgeon: Carloyn Manner, MD;  Location: [facility];  Service: ENT;  Laterality: Left;    FAMILY HISTORY: Family History  Problem Relation Age of Onset   Diabetes Mother    Cancer Mother        Brain, breast    Diabetes Sister    Diabetes Maternal Grandmother     SOCIAL HISTORY: Social History   Socioeconomic History   Marital status: Single    Spouse name: Not on file   Number of children: Not on file   Years of education: 8   Highest education level: 8th grade  Occupational History   Not on file  Tobacco Use   Smoking status: Every Day    Packs/day: 1.50    Years: 30.00    Total pack years: 45.00    Types: Cigarettes   Smokeless tobacco: Never  Vaping Use   Vaping Use: Never used  Substance and Sexual Activity   Alcohol use: Not Currently   Drug use: Yes    Types: Marijuana    Comment: Several times per week   Sexual activity: Yes  Other Topics Concern   Not on file  Social History Narrative   Right handed    Caffeine 2 cups per day    Lives at home with his mom    Social Determinants of Health   Financial Resource Strain: Not on file  Food Insecurity: Not on file  Transportation Needs: Not on file  Physical Activity: Not on  file  Stress: Not on file  Social Connections: Not on file  Intimate Partner Violence: Not on file      PHYSICAL EXAM  There were no vitals filed for this visit.  There is no height or weight on file to calculate BMI.  Generalized: Well developed, in no acute distress     01/27/2021    1:35 PM 06/30/2020    3:04 PM 11/20/2019    9:46 AM  MMSE - Mini Mental State Exam  Orientation to time _0 Orientation to Place _1 Registration _2 Attention/ Calculation 4 3 0  Recall 2 2 0  Language- name 2 objects _3 Language- repeat _4 Language- follow 3 step command _5 Language- read & follow direction _6 Write a sentence _7 Copy design _8 Total score _9 Neurological examination  Mentation: Alert oriented to time, place, history taking. Follows all commands speech and language fluent Cranial nerve II-XII: Pupils were equal round reactive to light. Extraocular movements were full, visual field were full on confrontational test. Facial sensation and strength were normal. Head turning and shoulder shrug  were normal and symmetric. Motor: The motor testing reveals 5 over 5 strength of all 4 extremities. Good symmetric motor tone is noted throughout.  Sensory: Sensory testing is intact to soft touch on all 4 extremities. No evidence of extinction is noted.  Coordination: Cerebellar testing reveals good finger-nose-finger and heel-to-shin bilaterally.  Gait and station: Gait is normal.  Reflexes: Deep tendon reflexes are symmetric and normal bilaterally.   DIAGNOSTIC DATA (LABS, IMAGING, TESTING) - I reviewed patient records, labs, notes, testing and imaging myself where available.  Lab Results  Component Value Date   WBC 24.6 (H) 08/16/2020   HGB 16.1 08/16/2020   HCT  46.2 08/16/2020   MCV 93.1 08/16/2020   PLT 272 08/16/2020      Component Value Date/Time   NA 134 (L) 08/17/2020 0639   NA 137 11/20/2019 1048   K 4.0 08/17/2020 0639    CL 103 08/17/2020 0639   CO2 24 08/17/2020 0639   GLUCOSE 83 08/17/2020 0639   BUN 17 08/17/2020 0639   BUN 8 11/20/2019 1048   CREATININE 1.09 08/17/2020 0639   CALCIUM 8.4 (L) 08/17/2020 0639   PROT 6.2 11/20/2019 1048   ALBUMIN 4.2 11/20/2019 1048   AST 20 11/20/2019 1048   ALT 14 11/20/2019 1048   ALKPHOS 80 11/20/2019 1048   BILITOT 0.4 11/20/2019 1048   GFRNONAA >60 08/17/2020 0639   GFRAA 105 11/20/2019 1048   Lab Results  Component Value Date   CHOL 226 (H) 09/11/2018   HDL 65 09/11/2018   LDLCALC 143 (H) 09/11/2018   TRIG 89 09/11/2018   CHOLHDL 3.5 09/11/2018   Lab Results  Component Value Date   HGBA1C 6.8 (H) 08/17/2020   Lab Results  Component Value Date   BVAPOLID03 013 11/20/2019   Lab Results  Component Value Date   TSH 10.242 (H) 08/17/2020      ASSESSMENT AND PLAN 46 y.o. year old male  has a past medical history of ADHD (attention deficit hyperactivity disorder), inattentive type, Chronic midline low back pain with right-sided sciatica (02/11/2018), Chronic right shoulder pain (02/11/2018), DKA (diabetic ketoacidosis) (08/17/2020), DOE (dyspnea on exertion) (05/19/2019), Hyperlipidemia due to type 1 diabetes mellitus (04/18/2018), Hypertension associated with diabetes (04/18/2018), Hypothyroidism, Major depressive disorder (10/09/2019), Moderate nonproliferative diabetic retinopathy of both eyes ([place]) (05/19/2019), Tobacco dependence (10/09/2019), and Type 1 diabetes mellitus with diabetic polyneuropathy (04/18/2018). here with:  1.  Reported memory disturbance 2.  Depression 3.  ADHD  -MRI of the brain was ordered by Dr. Jannifer Banks after the last office visit, approval was gained through peer-to-peer, but the patient never scheduled it, I will again pursue this -He completed neuropsychological evaluation with Dr. Melvyn Novas, it was felt that his cognitive dysfunction was related to his psychiatric history, various medical conditions, and marijuana use (See  impression below) -MMSE was 27/30 today -Has previously had blood work for B12, HIV, RPR that were unremarkable -I have discussed a referral to psychiatry for management of his mood disorder, but he wants to discuss with PCP first -If MRI of the brain is unremarkable, he can follow-up here on an as-needed basis, I have scheduled a tentative 28-monthfollow-up to ensure MRI is completed, he will be followed by Dr. ARexene Albertssince Dr. WJannifer Franklinhas retired  Copied Dr. MMelvyn NovasNote 09/16/20: Overall, the most likely culprit for ongoing cognitive dysfunction is an underlying and currently untreated ADHD presentation, exacerbated by psychiatric distress (e.g., depression), various medical conditions (e.g., type I diabetes, hypertension, hyperlipidemia, hypothyroidism), and regular marijuana consumption. Mr. LBriningis far too young to strongly consider the presence of a neurodegenerative process and I do not see evidence for any other potential neurological cause for reported symptoms or patterns across neurocognitive testing.   SButler Denmark AGNP-C, DNP 08/04/2021, 7:50 AM [facility] [address] [place] [phone]

## 2021-08-09 ENCOUNTER — Other Ambulatory Visit: Payer: Self-pay | Admitting: Internal Medicine

## 2021-10-23 ENCOUNTER — Other Ambulatory Visit: Payer: Self-pay | Admitting: Internal Medicine

## 2021-10-23 DIAGNOSIS — R0789 Other chest pain: Secondary | ICD-10-CM

## 2021-10-24 NOTE — Telephone Encounter (Signed)
Requested medication (s) are due for refill today:   Yes  Requested medication (s) are on the active medication list:   Yes  Future visit scheduled:   No   Last ordered: 03/07/2021 100 g, 0 refills  Returned because labs overdue per protocol   Requested Prescriptions  Pending Prescriptions Disp Refills   diclofenac Sodium (VOLTAREN) 1 % GEL [Pharmacy Med Name: DICLOFENAC SODIUM 1% GEL] 100 g 0    Sig: APPLY 2 GRAMS TO AFFECTED AREA 4 TIMES A DAY     Analgesics:  Topicals Failed - 10/23/2021  1:20 PM      Failed - Manual Review: Labs are only required if the patient has taken medication for more than 8 weeks.      Failed - PLT in normal range and within 360 days    Platelets  Date Value Ref Range Status  08/16/2020 272 150 - 400 K/uL Final  11/20/2019 368 150 - 450 x10E3/uL Final         Failed - HGB in normal range and within 360 days    Hemoglobin  Date Value Ref Range Status  08/16/2020 16.1 13.0 - 17.0 g/dL Final  11/20/2019 16.1 13.0 - 17.7 g/dL Final         Failed - HCT in normal range and within 360 days    HCT  Date Value Ref Range Status  08/16/2020 46.2 39.0 - 52.0 % Final   Hematocrit  Date Value Ref Range Status  11/20/2019 47.2 37.5 - 51.0 % Final         Failed - Cr in normal range and within 360 days    Creatinine, Ser  Date Value Ref Range Status  08/17/2020 1.09 0.61 - 1.24 mg/dL Final         Failed - eGFR is 30 or above and within 360 days    GFR calc Af Amer  Date Value Ref Range Status  11/20/2019 105 >59 mL/min/1.73 Final    Comment:    **In accordance with recommendations from the NKF-ASN Task force,**   [facility] is in the process of updating its eGFR calculation to the   2021 CKD-EPI creatinine equation that estimates kidney function   without a race variable.    GFR, Estimated  Date Value Ref Range Status  08/17/2020 >60 >60 mL/min Final    Comment:    (NOTE) Calculated using the CKD-EPI Creatinine Equation (2021)    GFR   Date Value Ref Range Status  05/19/2019 86.53 >60.00 mL/min Final         Passed - Patient is not pregnant      Passed - Valid encounter within last 12 months    Recent Outpatient Visits           7 months ago Other chest pain   [facility] Ladell Pier, MD   1 year ago DM type 1 with diabetic peripheral neuropathy [facility])   [place] Ladell Pier, MD   1 year ago Chronic midline low back pain without sciatica   [facility] Ladell Pier, MD   1 year ago Acute right-sided low back pain without sciatica   [facility] Ladell Pier, MD   1 year ago Acute right-sided low back pain without sciatica   [facility] Ladell Pier, MD

## 2021-12-28 ENCOUNTER — Ambulatory Visit
Admission: EM | Admit: 2021-12-28 | Discharge: 2021-12-28 | Disposition: A | Discharge status: Home / Self Care | Payer: Medicaid Other | Attending: Emergency Medicine | Admitting: Emergency Medicine

## 2021-12-28 DIAGNOSIS — Z20822 Contact with and (suspected) exposure to covid-19: Secondary | ICD-10-CM | POA: Insufficient documentation

## 2021-12-28 NOTE — Discharge Instructions (Addendum)
Your COVID test is pending.    Follow-up with your primary care provider.

## 2021-12-28 NOTE — ED Provider Notes (Signed)
Ronald Banks    CSN: [id number] Arrival date & time: 12/28/21  1446      History   Chief Complaint Chief Complaint  Patient presents with   Covid Exposure    HPI Ronald Banks is a 46 y.o. male.  Patient presents with request for COVID test.  He reports exposure to relative with COVID 2 days ago.  He denies symptoms.  He denies fever, chills, congestion, cough, shortness of breath, vomiting, diarrhea, or other symptoms.  Patient states he needs the COVID test in order to return to work.  His medical history includes hypertension and type 1 diabetes.  The history is provided by the patient and medical records.    Past Medical History:  Diagnosis Date   ADHD (attention deficit hyperactivity disorder), inattentive type    Chronic midline low back pain with right-sided sciatica 02/11/2018   Chronic right shoulder pain 02/11/2018   DKA (diabetic ketoacidosis) 08/17/2020   DOE (dyspnea on exertion) 05/19/2019   Onset 2015/16 05/19/2019   Walked RA x two laps =  approx 541f @ brisk pace - stopped due to end of study with sats of 98 % at the end of the study and c/o sob s cp  > rec CPST next  - alpha one AT  Screen   05/19/19 MM  152  - CPST  06/30/19  FEV1 3.79 (93%) with ratio 0.73 and nl f/v loop: ex study  probabably  wnl when wt factored  In  - sob with ventilatory reserve suggested sub max effort and de   Hyperlipidemia due to type 1 diabetes mellitus 04/18/2018   Hypertension associated with diabetes 04/18/2018   Trial off acei 04/07/2019 due to cough > improved by 07/03/2019 with just a typical smoker's rattle (no longer cough to point of gag/vomit)   Hypothyroidism    Major depressive disorder 10/09/2019   Moderate nonproliferative diabetic retinopathy of both eyes ([place] 05/19/2019   Tobacco dependence 10/09/2019   Type 1 diabetes mellitus with diabetic polyneuropathy 04/18/2018    Patient Active Problem List   Diagnosis Date Noted   Close exposure to COVID-19 virus  12/28/2021   Memory difficulties 01/27/2021   ADHD (attention deficit hyperactivity disorder), inattentive type    DKA (diabetic ketoacidosis) 08/17/2020   Tobacco dependence 10/09/2019   Major depressive disorder 10/09/2019   Moderate nonproliferative diabetic retinopathy of both eyes 05/19/2019   DOE (dyspnea on exertion) 05/19/2019   Upper airway cough syndrome 04/07/2019   Hypertension associated with diabetes 04/18/2018   Hyperlipidemia due to type 1 diabetes mellitus 04/18/2018   Type 1 diabetes mellitus with diabetic polyneuropathy 04/18/2018   Hypothyroidism 02/11/2018   Chronic right shoulder pain 02/11/2018   Chronic midline low back pain with right-sided sciatica 02/11/2018    Past Surgical History:  Procedure Laterality Date   DENTAL SURGERY     EAR CYST EXCISION Right 03/24/2020   Procedure: EXCISION PRE-AURICULAR CYST;  Surgeon: VCarloyn Manner MD;  [facility]  Service: ENT;  Laterality: Right;  diabetic insulin pump   PREAURICULAR CYST EXCISION Left 07/07/2020   Procedure: EXCISION PREAURICULAR CYST ADULT;  Surgeon: VCarloyn Manner MD;  [facility] [facility]  Service: ENT;  Laterality: Left;       Home Medications    Prior to Admission medications   Medication Sig Start Date End Date Taking? Authorizing Provider  ACCU-CHEK FASTCLIX LANCETS MISC Use to check blood sugar up to 5 times daily. 03/05/18   FAntony Blackbird MD  ACCU-CHEK GUIDE test strip USE TO TEST BLOOD SUGAR UP TO 5 TIMES DAILY 05/09/21   Ladell Pier, MD  atorvastatin (LIPITOR) 10 MG tablet Take 10 mg by mouth daily. 03/01/21   [provider]  Blood Glucose Monitoring Suppl (ACCU-CHEK GUIDE) w/Device KIT 1 kit by Does not apply route 5 (five) times daily. 03/05/18   Fulp, Cammie, MD  Continuous Blood Gluc Sensor (DEXCOM G6 SENSOR) MISC SMARTSIG:Topical Every 10 Days 06/02/20   [provider]  Continuous Blood Gluc Transmit (DEXCOM G6 TRANSMITTER) MISC  Use to monitor blood sugar.  Replace every 3 months 03/24/20   [provider]  cyclobenzaprine (FLEXERIL) 5 MG tablet Take 1 tablet (5 mg total) by mouth 3 (three) times daily as needed for muscle spasms. 01/26/21   Sharion Balloon, NP  diclofenac Sodium (VOLTAREN) 1 % GEL Apply 2 g topically 4 (four) times daily. 03/07/21   Ladell Pier, MD  fluticasone (FLONASE) 50 MCG/ACT nasal spray Place 2 sprays into both nostrils daily. 07/30/19   Argentina Donovan, PA-C  gabapentin (NEURONTIN) 600 MG tablet Take 1 tablet (600 mg total) by mouth 3 (three) times daily. 05/04/20   Ladell Pier, MD  insulin aspart (NOVOLOG) 100 UNIT/ML injection Up to 100 units daily in pump 08/12/20   [provider]  Insulin Detemir (LEVEMIR FLEXTOUCH) 100 UNIT/ML Pen Inject into the skin. 04/18/18 01/26/21  [provider]  Insulin Human (INSULIN PUMP) SOLN Inject into the skin.    [provider]  INSULIN SYRINGE .5CC/29G (B-D INSULIN SYRINGE) 29G X 1/2" 0.5 ML MISC USE TO INJECT INSULIN EVERY DAY. 05/28/19   Fulp, Cammie, MD  levothyroxine (SYNTHROID) 125 MCG tablet Take 2 tablets by mouth daily. 02/09/21   [provider]  lidocaine (LIDODERM) 5 % Place 1 patch onto the skin daily. Remove & Discard patch within 12 hours or as directed by MD 05/12/20   Raulkar, Clide Deutscher, MD  meloxicam (MOBIC) 15 MG tablet Take 1 tablet by mouth daily. 05/29/20   [provider]    Family History Family History  Problem Relation Age of Onset   Diabetes Mother    Cancer Mother        Brain, breast    Diabetes Sister    Diabetes Maternal Grandmother     Social History Social History   Tobacco Use   Smoking status: Every Day    Packs/day: 1.50    Years: 30.00    Total pack years: 45.00    Types: Cigarettes   Smokeless tobacco: Never  Vaping Use   Vaping Use: Never used  Substance Use Topics   Alcohol use: Not Currently   Drug use: Yes    Types: Marijuana    Comment: Several  times per week     Allergies   Patient has no known allergies.   Review of Systems Review of Systems  Constitutional:  Negative for chills and fever.  HENT:  Negative for ear pain and sore throat.   Respiratory:  Negative for cough and shortness of breath.   Cardiovascular:  Negative for chest pain and palpitations.  Gastrointestinal:  Negative for diarrhea and vomiting.  Skin:  Negative for color change and rash.  All other systems reviewed and are negative.    Physical Exam Triage Vital Signs ED Triage Vitals  Enc Vitals Group     BP 12/28/21 1549 132/68     Pulse --      Resp --  Temp --      Temp src --      SpO2 --      Weight 12/28/21 1549 190 lb (86.2 kg)     Height 12/28/21 1549 _0  (1.778 m)     Head Circumference --      Peak Flow --      Pain Score 12/28/21 1548 0     Pain Loc --      Pain Edu? --      Excl. in [place]? --    No data found.  Updated Vital Signs BP 132/68   Pulse 70   Temp 98.4 F (36.9 C)   Resp 18   Ht _1  (1.778 m)   Wt 190 lb (86.2 kg)   SpO2 96%   BMI 27.26 kg/m   Visual Acuity Right Eye Distance:   Left Eye Distance:   Bilateral Distance:    Right Eye Near:   Left Eye Near:    Bilateral Near:     Physical Exam Vitals and nursing note reviewed.  Constitutional:      General: He is not in acute distress.    Appearance: Normal appearance. He is well-developed. He is not ill-appearing.  HENT:     Right Ear: Tympanic membrane normal.     Left Ear: Tympanic membrane normal.     Nose: Nose normal.     Mouth/Throat:     Mouth: Mucous membranes are moist.     Pharynx: Oropharynx is clear.  Cardiovascular:     Rate and Rhythm: Normal rate and regular rhythm.     Heart sounds: Normal heart sounds.  Pulmonary:     Effort: Pulmonary effort is normal. No respiratory distress.     Breath sounds: Normal breath sounds.  Musculoskeletal:     Cervical back: Neck supple.  Skin:    General: Skin is warm and dry.   Neurological:     Mental Status: He is alert.  Psychiatric:        Mood and Affect: Mood normal.        Behavior: Behavior normal.      UC Treatments / Results  Labs (all labs ordered are listed, but only abnormal results are displayed) Labs Reviewed  SARS CORONAVIRUS 2 (TAT 6-24 HRS)    EKG   Radiology No results found.  Procedures Procedures (including critical care time)  Medications Ordered in UC Medications - No data to display  Initial Impression / Assessment and Plan / UC Course  I have reviewed the triage vital signs and the nursing notes.  Pertinent labs & imaging results that were available during my care of the patient were reviewed by me and considered in my medical decision making (see chart for details).   Exposure to COVID, encounter for COVID test.  COVID pending.  Discussed symptomatic treatment as needed including Tylenol or ibuprofen, rest, hydration.  Instructed patient to follow up with PCP.  He agrees to plan of care.    Final Clinical Impressions(s) / UC Diagnoses   Final diagnoses:  Exposure to COVID-19 virus  Encounter for laboratory testing for COVID-19 virus     Discharge Instructions      Your COVID test is pending.    Follow-up with your primary care provider.         ED Prescriptions   None    PDMP not reviewed this encounter.   Sharion Balloon, NP 12/28/21 [phone]

## 2021-12-28 NOTE — ED Triage Notes (Signed)
Patient to Urgent Care with complaints of covid exposure- reports his cousin tested positive for covid on Tuesday and he spent time with him on Monday. Denies any symptoms.  Negative home test.

## 2021-12-29 LAB — SARS CORONAVIRUS 2 (TAT 6-24 HRS): SARS Coronavirus 2: NEGATIVE

## 2022-05-29 ENCOUNTER — Emergency Department: Payer: Medicaid Other

## 2022-05-29 ENCOUNTER — Emergency Department
Admission: EM | Admit: 2022-05-29 | Discharge: 2022-05-29 | Disposition: A | Discharge status: Home / Self Care | Payer: Medicaid Other | Attending: Emergency Medicine | Admitting: Emergency Medicine

## 2022-05-29 ENCOUNTER — Other Ambulatory Visit: Payer: Self-pay

## 2022-05-29 DIAGNOSIS — E162 Hypoglycemia, unspecified: Secondary | ICD-10-CM

## 2022-05-29 DIAGNOSIS — I10 Essential (primary) hypertension: Secondary | ICD-10-CM | POA: Diagnosis not present

## 2022-05-29 DIAGNOSIS — E11649 Type 2 diabetes mellitus with hypoglycemia without coma: Secondary | ICD-10-CM | POA: Diagnosis not present

## 2022-05-29 DIAGNOSIS — E039 Hypothyroidism, unspecified: Secondary | ICD-10-CM | POA: Diagnosis not present

## 2022-05-29 DIAGNOSIS — R825 Elevated urine levels of drugs, medicaments and biological substances: Secondary | ICD-10-CM | POA: Insufficient documentation

## 2022-05-29 DIAGNOSIS — Y92410 Unspecified street and highway as the place of occurrence of the external cause: Secondary | ICD-10-CM | POA: Insufficient documentation

## 2022-05-29 DIAGNOSIS — R410 Disorientation, unspecified: Secondary | ICD-10-CM | POA: Diagnosis present

## 2022-05-29 LAB — CBC WITH DIFFERENTIAL/PLATELET
Abs Immature Granulocytes: 0.03 10*3/uL (ref 0.00–0.07)
Basophils Absolute: 0 10*3/uL (ref 0.0–0.1)
Basophils Relative: 0 %
Eosinophils Absolute: 0.1 10*3/uL (ref 0.0–0.5)
Eosinophils Relative: 1 %
HCT: 41.2 % (ref 39.0–52.0)
Hemoglobin: 13.6 g/dL (ref 13.0–17.0)
Immature Granulocytes: 0 %
Lymphocytes Relative: 31 %
Lymphs Abs: 2.9 10*3/uL (ref 0.7–4.0)
MCH: 31.2 pg (ref 26.0–34.0)
MCHC: 33 g/dL (ref 30.0–36.0)
MCV: 94.5 fL (ref 80.0–100.0)
Monocytes Absolute: 0.6 10*3/uL (ref 0.1–1.0)
Monocytes Relative: 7 %
Neutro Abs: 5.8 10*3/uL (ref 1.7–7.7)
Neutrophils Relative %: 61 %
Platelets: 306 10*3/uL (ref 150–400)
RBC: 4.36 MIL/uL (ref 4.22–5.81)
RDW: 12.1 % (ref 11.5–15.5)
WBC: 9.4 10*3/uL (ref 4.0–10.5)
nRBC: 0 % (ref 0.0–0.2)

## 2022-05-29 LAB — COMPREHENSIVE METABOLIC PANEL
ALT: 12 U/L (ref 0–44)
AST: 16 U/L (ref 15–41)
Albumin: 3.5 g/dL (ref 3.5–5.0)
Alkaline Phosphatase: 67 U/L (ref 38–126)
Anion gap: 7 (ref 5–15)
BUN: 14 mg/dL (ref 6–20)
CO2: 28 mmol/L (ref 22–32)
Calcium: 8.3 mg/dL — ABNORMAL LOW (ref 8.9–10.3)
Chloride: 102 mmol/L (ref 98–111)
Creatinine, Ser: 0.78 mg/dL (ref 0.61–1.24)
GFR, Estimated: >60 mL/min (ref >60)
Glucose, Bld: 90 mg/dL (ref 70–99)
Potassium: 3.8 mmol/L (ref 3.5–5.1)
Sodium: 137 mmol/L (ref 135–145)
Total Bilirubin: 0.5 mg/dL (ref 0.3–1.2)
Total Protein: 6.7 g/dL (ref 6.5–8.1)

## 2022-05-29 LAB — URINE DRUG SCREEN, QUALITATIVE (ARMC ONLY)
Amphetamines, Ur Screen: POSITIVE — AB
Barbiturates, Ur Screen: NOT DETECTED
Benzodiazepine, Ur Scrn: NOT DETECTED
Cannabinoid 50 Ng, Ur ~~LOC~~: NOT DETECTED
Cocaine Metabolite,Ur ~~LOC~~: POSITIVE — AB
MDMA (Ecstasy)Ur Screen: NOT DETECTED
Methadone Scn, Ur: NOT DETECTED
Opiate, Ur Screen: NOT DETECTED
Phencyclidine (PCP) Ur S: NOT DETECTED
Tricyclic, Ur Screen: NOT DETECTED

## 2022-05-29 LAB — URINALYSIS, ROUTINE W REFLEX MICROSCOPIC
Bilirubin Urine: NEGATIVE
Glucose, UA: NEGATIVE mg/dL
Hgb urine dipstick: NEGATIVE
Ketones, ur: NEGATIVE mg/dL
Leukocytes,Ua: NEGATIVE
Nitrite: NEGATIVE
Protein, ur: NEGATIVE mg/dL
Specific Gravity, Urine: 1.008 (ref 1.005–1.030)
pH: 6 (ref 5.0–8.0)

## 2022-05-29 LAB — ETHANOL: Alcohol, Ethyl (B): <10 mg/dL (ref <10)

## 2022-05-29 LAB — CBG MONITORING, ED
Glucose-Capillary: 81 mg/dL (ref 70–99)
Glucose-Capillary: >600 mg/dL (ref 70–99)
Glucose-Capillary: 78 mg/dL (ref 70–99)
Glucose-Capillary: 201 mg/dL — ABNORMAL HIGH (ref 70–99)

## 2022-05-29 MED ORDER — DEXTROSE 50 % IV SOLN
INTRAVENOUS | Status: AC
  Filled 2022-05-29: qty 50

## 2022-05-29 MED ORDER — DROPERIDOL 2.5 MG/ML IJ SOLN
2.5000 mg | Freq: Once | INTRAMUSCULAR | Status: DC
  Filled 2022-05-29: qty 2

## 2022-05-29 NOTE — ED Notes (Signed)
Pt standing at bedside endorsing need to use bathroom. Urinal provided and pt informed of need for urine sample.

## 2022-05-29 NOTE — ED Notes (Signed)
Previous CBG of >500 is in error, collected from line that dextrose was running through. Provider notified. CBG of 80 collected by finger stick.

## 2022-05-29 NOTE — ED Notes (Signed)
High[address] patrol to bedside at this time
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
## 2022-05-30 ENCOUNTER — Other Ambulatory Visit (HOSPITAL_COMMUNITY): Payer: Self-pay

## 2022-05-30 ENCOUNTER — Telehealth: Payer: Self-pay | Admitting: *Deleted

## 2022-05-30 NOTE — Telephone Encounter (Signed)
Called Mr. Ogaz on his mother's cell phone [phone] to let him know that our outpatient [facility] pharmacy checked on Dexcom G6 sensors and looks like it needs prior authorization in order to be refilled. Encouraged patient to call Dr. Elberta Fortis office and let them know that he had significant hypoglycemia on 5/6 and to ask if they can work on getting prior authorization approved so he can get his Dexcom G6 sensors filled. Patient appreciative of call and verbalized understanding.

## 2022-06-27 LAB — HM DIABETES EYE EXAM

## 2022-08-08 ENCOUNTER — Other Ambulatory Visit: Payer: Self-pay | Admitting: Internal Medicine

## 2022-08-08 DIAGNOSIS — E1042 Type 1 diabetes mellitus with diabetic polyneuropathy: Secondary | ICD-10-CM

## 2022-11-25 ENCOUNTER — Other Ambulatory Visit: Payer: Self-pay

## 2022-11-25 ENCOUNTER — Emergency Department: Payer: BLUE CROSS/BLUE SHIELD

## 2022-11-25 ENCOUNTER — Emergency Department
Admission: EM | Admit: 2022-11-25 | Discharge: 2022-11-25 | Disposition: A | Discharge status: Home / Self Care | Payer: BLUE CROSS/BLUE SHIELD | Attending: Emergency Medicine | Admitting: Emergency Medicine

## 2022-11-25 DIAGNOSIS — E162 Hypoglycemia, unspecified: Secondary | ICD-10-CM | POA: Diagnosis present

## 2022-11-25 DIAGNOSIS — R531 Weakness: Secondary | ICD-10-CM | POA: Insufficient documentation

## 2022-11-25 DIAGNOSIS — I10 Essential (primary) hypertension: Secondary | ICD-10-CM | POA: Diagnosis not present

## 2022-11-25 DIAGNOSIS — R748 Abnormal levels of other serum enzymes: Secondary | ICD-10-CM | POA: Insufficient documentation

## 2022-11-25 DIAGNOSIS — E10641 Type 1 diabetes mellitus with hypoglycemia with coma: Secondary | ICD-10-CM

## 2022-11-25 DIAGNOSIS — E10649 Type 1 diabetes mellitus with hypoglycemia without coma: Secondary | ICD-10-CM | POA: Insufficient documentation

## 2022-11-25 LAB — CBC
HCT: 45.4 % (ref 39.0–52.0)
Hemoglobin: 15.3 g/dL (ref 13.0–17.0)
MCH: 31.4 pg (ref 26.0–34.0)
MCHC: 33.7 g/dL (ref 30.0–36.0)
MCV: 93.2 fL (ref 80.0–100.0)
Platelets: 305 10*3/uL (ref 150–400)
RBC: 4.87 MIL/uL (ref 4.22–5.81)
RDW: 12.9 % (ref 11.5–15.5)
WBC: 13.2 10*3/uL — ABNORMAL HIGH (ref 4.0–10.5)
nRBC: 0 % (ref 0.0–0.2)

## 2022-11-25 LAB — COMPREHENSIVE METABOLIC PANEL
ALT: 23 U/L (ref 0–44)
AST: 33 U/L (ref 15–41)
Albumin: 3.9 g/dL (ref 3.5–5.0)
Alkaline Phosphatase: 62 U/L (ref 38–126)
Anion gap: 8 (ref 5–15)
BUN: 11 mg/dL (ref 6–20)
CO2: 26 mmol/L (ref 22–32)
Calcium: 8.6 mg/dL — ABNORMAL LOW (ref 8.9–10.3)
Chloride: 101 mmol/L (ref 98–111)
Creatinine, Ser: 0.82 mg/dL (ref 0.61–1.24)
GFR, Estimated: >60 mL/min (ref >60)
Glucose, Bld: 35 mg/dL — CL (ref 70–99)
Potassium: 3.7 mmol/L (ref 3.5–5.1)
Sodium: 135 mmol/L (ref 135–145)
Total Bilirubin: 1 mg/dL (ref 0.3–1.2)
Total Protein: 6.7 g/dL (ref 6.5–8.1)

## 2022-11-25 LAB — CBG MONITORING, ED
Glucose-Capillary: 26 mg/dL — CL (ref 70–99)
Glucose-Capillary: 157 mg/dL — ABNORMAL HIGH (ref 70–99)
Glucose-Capillary: 203 mg/dL — ABNORMAL HIGH (ref 70–99)
Glucose-Capillary: 147 mg/dL — ABNORMAL HIGH (ref 70–99)
Glucose-Capillary: 72 mg/dL (ref 70–99)

## 2022-11-25 LAB — CK: Total CK: 607 U/L — ABNORMAL HIGH (ref 49–397)

## 2022-11-25 LAB — ETHANOL: Alcohol, Ethyl (B): <10 mg/dL (ref <10)

## 2022-11-25 MED ORDER — DEXTROSE 50 % IV SOLN
INTRAVENOUS | Status: AC
  Administered 2022-11-25: 50 mL
  Filled 2022-11-25: qty 50

## 2022-11-25 MED ORDER — DEXTROSE 5 % AND 0.45 % NACL IV BOLUS
1000.0000 mL | Freq: Once | INTRAVENOUS | Status: AC
  Administered 2022-11-25: 1000 mL via INTRAVENOUS
  Filled 2022-11-25: qty 1000

## 2022-11-25 MED ORDER — SODIUM CHLORIDE 0.9 % IV BOLUS
1000.0000 mL | Freq: Once | INTRAVENOUS | Status: AC
  Administered 2022-11-25: 1000 mL via INTRAVENOUS

## 2022-11-25 NOTE — ED Notes (Signed)
157 Blood glucose
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
## 2022-11-25 NOTE — ED Notes (Signed)
Attempted to call emergency contact to give update.  Unable to reach mother at this time.

## 2022-11-25 NOTE — ED Notes (Signed)
Spoke with Earlean Shawl, Mother regarding patient.  Update given.  Mom states she will come pick him up.

## 2022-11-25 NOTE — ED Notes (Signed)
Patient given a cup of orange juice in triage, and received an amp of d50 on arrival in room.
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
## 2022-11-25 NOTE — ED Notes (Signed)
Patient states that th last time he ate was yesterday during the day, he is not sure of the time.

## 2022-11-25 NOTE — ED Provider Notes (Signed)
Procedures     ----------------------------------------- 9:46 PM on 11/25/2022 -----------------------------------------   Repeat blood sugars are normal.  Patient is eating, asymptomatic.  Denies any recent illness.  Reports having adequate blood sugar testing supplies as well as insulin and other medications at home where he lives.  Stable for discharge.   Sharman Cheek, MD 11/25/22 2146
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
## 2023-05-02 ENCOUNTER — Encounter: Payer: Self-pay | Admitting: Nurse Practitioner

## 2023-05-02 ENCOUNTER — Ambulatory Visit: Attending: Nurse Practitioner | Admitting: Nurse Practitioner

## 2023-05-02 ENCOUNTER — Telehealth: Payer: Self-pay

## 2023-05-02 VITALS — BP 105/60 | HR 66 | Resp 20 | Ht 70.0 in | Wt 177.6 lb

## 2023-05-02 DIAGNOSIS — G8929 Other chronic pain: Secondary | ICD-10-CM

## 2023-05-02 DIAGNOSIS — E1042 Type 1 diabetes mellitus with diabetic polyneuropathy: Secondary | ICD-10-CM | POA: Diagnosis not present

## 2023-05-02 DIAGNOSIS — M5441 Lumbago with sciatica, right side: Secondary | ICD-10-CM | POA: Diagnosis not present

## 2023-05-02 MED ORDER — DICLOFENAC SODIUM 1 % EX GEL: 2.0000 g | Freq: Four times a day (QID) | CUTANEOUS | 0 refills | Status: DC

## 2023-05-02 MED ORDER — GABAPENTIN 600 MG PO TABS: 600.0000 mg | ORAL_TABLET | Freq: Three times a day (TID) | ORAL | 0 refills | Status: DC

## 2023-05-02 MED ORDER — LIDOCAINE 5 % EX PTCH: 1.0000 | MEDICATED_PATCH | CUTANEOUS | 0 refills | Status: DC

## 2023-05-02 MED ORDER — MELOXICAM 15 MG PO TABS: 15.0000 mg | ORAL_TABLET | Freq: Every day | ORAL | 1 refills | Status: DC

## 2023-05-02 MED ORDER — CYCLOBENZAPRINE HCL 10 MG PO TABS: 10.0000 mg | ORAL_TABLET | Freq: Three times a day (TID) | ORAL | 1 refills | Status: DC | PRN

## 2023-05-02 NOTE — Progress Notes (Signed)
 Assessment & Plan:  Ronald Banks was seen today for back pain and hand pain.  Diagnoses and all orders for this visit:  Chronic midline low back pain with right-sided sciatica -     Arthritis Panel Meloxicam 15 mg every day as needed for pain Diclofenac gel 1% Apply 2 g topically 4 (four) times daily. Lidocaine patch Place 1 patch onto the skin daily. Remove & Discard patch within 12 hours  Gabapentin 600 mg tablet take 1 tablet by mouth 3 times per day Ambulatory referral to Orthopedic Surgeon for hand and back pain. Education provided to patient on importance of adhering to medical treatment and follow up for chronic medical diseases.   Patient has been counseled on age-appropriate routine health concerns for screening and prevention. These are reviewed and up-to-date. Referrals have been placed accordingly. Immunizations are up-to-date or declined.    Subjective:   Chief Complaint  Patient presents with   Back Pain   Hand Pain    Right and left swollen    Ronald Banks 48 y.o. male presents to office today for bilateral hand and lower back pain.   He is a patient of Dr. Laural Benes whom he has not seen for a few years. States he has not been on his diabetes or thyroid medications for a few months. Endorses feeling tired and fatigued. Reports has insulin pump and insulin for pump. Last office visit with Endocrinologist was 04/27/2022. There have been 2 ED visits for hypoglycemia over the past year. He reported hypoglycemia was due to missing meals.  Patient plans to call Endocrinologist today to arrange follow up appointment.   Back Pain This is a chronic problem. The current episode started 1 to 4 weeks ago. The problem occurs constantly. The problem is unchanged. The pain is present in the lumbar spine. The quality of the pain is described as aching. The pain does not radiate. The pain is at a severity of 5/10. The pain is moderate. The pain is The same all the time. The symptoms are  aggravated by standing. Stiffness is present All day. Associated symptoms include numbness and tingling. Risk factors include lack of exercise and sedentary lifestyle. He has tried NSAIDs and analgesics for the symptoms. The treatment provided mild relief.  Hand Pain  The incident occurred 3 to 5 days ago. The incident occurred at home. The injury mechanism was repetitive motion. The pain is present in the left hand, right hand, left fingers and right fingers. The quality of the pain is described as aching and cramping. The pain does not radiate. The pain is at a severity of 3/10. The pain is mild. The pain has been Intermittent since the incident. Associated symptoms include numbness and tingling. Associated symptoms comments: swelling. The symptoms are aggravated by movement. He has tried NSAIDs and acetaminophen for the symptoms. The treatment provided mild relief.    Review of Systems  Constitutional:  Positive for malaise/fatigue.  HENT: Negative.    Eyes: Negative.   Respiratory: Negative.    Cardiovascular: Negative.   Gastrointestinal: Negative.   Musculoskeletal:  Positive for back pain and joint pain.  Skin: Negative.   Neurological:  Positive for tingling and numbness.  Endo/Heme/Allergies: Negative.   Psychiatric/Behavioral: Negative.      Past Medical History:  Diagnosis Date   ADHD (attention deficit hyperactivity disorder), inattentive type    Chronic midline low back pain with right-sided sciatica 02/11/2018   Chronic right shoulder pain 02/11/2018   DKA (diabetic ketoacidosis) 08/17/2020  DOE (dyspnea on exertion) 05/19/2019   Onset 2015/16 05/19/2019   Walked RA x two laps =  approx 562ft @ brisk pace - stopped due to end of study with sats of 98 % at the end of the study and c/o sob s cp  > rec CPST next  - alpha one AT  Screen   05/19/19 MM  152  - CPST  06/30/19  FEV1 3.79 (93%) with ratio 0.73 and nl f/v loop: ex study  probabably  wnl when wt factored  In  - sob with  ventilatory reserve suggested sub max effort and de   Hyperlipidemia due to type 1 diabetes mellitus 04/18/2018   Hypertension associated with diabetes 04/18/2018   Trial off acei 04/07/2019 due to cough > improved by 07/03/2019 with just a typical smoker's rattle (no longer cough to point of gag/vomit)   Hypothyroidism    Major depressive disorder 10/09/2019   Moderate nonproliferative diabetic retinopathy of both eyes (HCC) 05/19/2019   Tobacco dependence 10/09/2019   Type 1 diabetes mellitus with diabetic polyneuropathy 04/18/2018    Past Surgical History:  Procedure Laterality Date   DENTAL SURGERY     EAR CYST EXCISION Right 03/24/2020   Procedure: EXCISION PRE-AURICULAR CYST;  Surgeon: Bud Face, MD;  Location: [facility];  Service: ENT;  Laterality: Right;  diabetic insulin pump   PREAURICULAR CYST EXCISION Left 07/07/2020   Procedure: EXCISION PREAURICULAR CYST ADULT;  Surgeon: Bud Face, MD;  Location: [facility];  Service: ENT;  Laterality: Left;    Family History  Problem Relation Age of Onset   Diabetes Mother    Cancer Mother        Brain, breast    Diabetes Sister    Diabetes Maternal Grandmother     Social History Reviewed with no changes to be made today.   Outpatient Medications Prior to Visit  Medication Sig Dispense Refill   insulin aspart (NOVOLOG) 100 UNIT/ML injection Up to 100 units daily in pump     Insulin Detemir (LEVEMIR FLEXTOUCH) 100 UNIT/ML Pen Inject into the skin.     Insulin Human (INSULIN PUMP) SOLN Inject into the skin.     atorvastatin (LIPITOR) 10 MG tablet Take 10 mg by mouth daily. (Patient not taking: Reported on 05/02/2023)     cyclobenzaprine (FLEXERIL) 5 MG tablet Take 1 tablet (5 mg total) by mouth 3 (three) times daily as needed for muscle spasms. (Patient not taking: Reported on 05/02/2023) 15 tablet 0   diclofenac Sodium (VOLTAREN) 1 % GEL Apply 2 g topically 4 (four) times daily. (Patient not taking:  Reported on 05/02/2023) 100 g 0   fluticasone (FLONASE) 50 MCG/ACT nasal spray Place 2 sprays into both nostrils daily. (Patient not taking: Reported on 05/02/2023) 16 g 6   gabapentin (NEURONTIN) 600 MG tablet Take 1 tablet (600 mg total) by mouth 3 (three) times daily. (Patient not taking: Reported on 05/02/2023) 90 tablet 3   levothyroxine (SYNTHROID) 125 MCG tablet Take 2 tablets by mouth daily. (Patient not taking: Reported on 05/02/2023)     lidocaine (LIDODERM) 5 % Place 1 patch onto the skin daily. Remove & Discard patch within 12 hours or as directed by MD (Patient not taking: Reported on 05/02/2023) 30 patch 0   meloxicam (MOBIC) 15 MG tablet Take 1 tablet by mouth daily. (Patient not taking: Reported on 05/02/2023)     No facility-administered medications prior to visit.    No Known Allergies  Objective:    BP 105/60 (BP Location: Left Arm, Patient Position: Sitting, Cuff Size: Normal)   Pulse 66   Resp 20   Ht 5\' 10"  (1.778 m)   Wt 177 lb 9.6 oz (80.6 kg)   SpO2 97%   BMI 25.48 kg/m  Wt Readings from Last 3 Encounters:  05/02/23 177 lb 9.6 oz (80.6 kg)  05/29/22 189 lb 9.5 oz (86 kg)  12/28/21 190 lb (86.2 kg)    Physical Exam Constitutional:      Appearance: Normal appearance.  HENT:     Head: Normocephalic.  Cardiovascular:     Rate and Rhythm: Normal rate and regular rhythm.     Pulses:          Radial pulses are 2+ on the right side and 2+ on the left side.     Heart sounds: Normal heart sounds.  Pulmonary:     Effort: Pulmonary effort is normal.     Breath sounds: Normal breath sounds.  Musculoskeletal:        General: Swelling and tenderness present.     Right hand: Swelling and tenderness present. Decreased strength. Normal strength of finger abduction, thumb/finger opposition and wrist extension. Normal sensation.     Left hand: Swelling and tenderness present. Decreased strength. Normal strength of finger abduction, thumb/finger opposition and wrist extension.  Normal sensation.     Cervical back: Normal range of motion.  Skin:    General: Skin is warm and dry.  Neurological:     General: No focal deficit present.     Mental Status: He is alert and oriented to person, place, and time.  Psychiatric:        Attention and Perception: Attention normal.        Mood and Affect: Mood normal. Affect is not flat.        Speech: Speech normal.        Behavior: Behavior normal. Behavior is cooperative.        Thought Content: Thought content normal.        Cognition and Memory: Cognition normal.        Judgment: Judgment normal.        Patient has been counseled extensively about nutrition and exercise as well as the importance of adherence with medications and regular follow-up. The patient was given clear instructions to go to ER or return to medical center if symptoms don't improve, worsen or new problems develop. The patient verbalized understanding.   Follow-up: Return if symptoms worsen or fail to improve.   Tonette Bihari RN- student FNP [facility] [place], [place] [phone]   05/02/2023, 4:12 PM

## 2023-05-02 NOTE — Telephone Encounter (Signed)
 Pharmacy Patient Advocate Encounter   Received notification from CoverMyMeds that prior authorization for LIDOCAINE PATCH is required/requested.   Insurance verification completed.   The patient is insured through [employer] .   Per test claim: PA required; PA submitted to above mentioned insurance via CoverMyMeds Key/confirmation #/EOC [id number] Status is pending

## 2023-05-02 NOTE — Progress Notes (Signed)
 I have seen and examined this patient with the advanced practice provider STUDENT and agree with the note below

## 2023-05-03 LAB — ARTHRITIS PANEL
Basophils Absolute: 0.1 10*3/uL (ref 0.0–0.2)
Basos: 1 %
EOS (ABSOLUTE): 0.2 10*3/uL (ref 0.0–0.4)
Eos: 2 %
Hematocrit: 46 % (ref 37.5–51.0)
Hemoglobin: 15.2 g/dL (ref 13.0–17.7)
Immature Grans (Abs): 0 10*3/uL (ref 0.0–0.1)
Immature Granulocytes: 0 %
Lymphocytes Absolute: 2.6 10*3/uL (ref 0.7–3.1)
Lymphs: 29 %
MCH: 31.1 pg (ref 26.6–33.0)
MCHC: 33 g/dL (ref 31.5–35.7)
MCV: 94 fL (ref 79–97)
Monocytes Absolute: 0.3 10*3/uL (ref 0.1–0.9)
Monocytes: 3 %
Neutrophils Absolute: 5.9 10*3/uL (ref 1.4–7.0)
Neutrophils: 65 %
Platelets: 268 10*3/uL (ref 150–450)
RBC: 4.89 x10E6/uL (ref 4.14–5.80)
RDW: 12 % (ref 11.6–15.4)
Rheumatoid fact SerPl-aCnc: 10.7 [IU]/mL (ref <14.0)
Sed Rate: 8 mm/h (ref 0–15)
Uric Acid: 3.2 mg/dL — ABNORMAL LOW (ref 3.8–8.4)
WBC: 9.1 10*3/uL (ref 3.4–10.8)

## 2023-05-06 ENCOUNTER — Encounter: Payer: Self-pay | Admitting: Nurse Practitioner

## 2023-05-16 ENCOUNTER — Telehealth: Payer: Self-pay

## 2023-05-16 DIAGNOSIS — M545 Low back pain, unspecified: Secondary | ICD-10-CM

## 2023-05-16 NOTE — Telephone Encounter (Signed)
 LVM for patient to go have xrays done and let him know the orders are already there for him

## 2023-05-17 ENCOUNTER — Ambulatory Visit: Admitting: Orthopedic Surgery

## 2023-06-19 DIAGNOSIS — E1042 Type 1 diabetes mellitus with diabetic polyneuropathy: Secondary | ICD-10-CM | POA: Diagnosis not present

## 2023-06-19 DIAGNOSIS — E109 Type 1 diabetes mellitus without complications: Secondary | ICD-10-CM | POA: Diagnosis not present

## 2023-07-03 LAB — HM DIABETES EYE EXAM

## 2023-08-02 DIAGNOSIS — E109 Type 1 diabetes mellitus without complications: Secondary | ICD-10-CM | POA: Diagnosis not present

## 2023-08-02 DIAGNOSIS — E1042 Type 1 diabetes mellitus with diabetic polyneuropathy: Secondary | ICD-10-CM | POA: Diagnosis not present
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
## 2023-10-31 ENCOUNTER — Other Ambulatory Visit: Payer: Self-pay

## 2023-10-31 ENCOUNTER — Emergency Department

## 2023-10-31 ENCOUNTER — Inpatient Hospital Stay
Admission: EM | Admit: 2023-10-31 | Discharge: 2023-11-06 | DRG: 917 | Disposition: A | Discharge status: Other Acute Inpatient Hospital | Attending: Internal Medicine | Admitting: Internal Medicine

## 2023-10-31 ENCOUNTER — Encounter: Payer: Self-pay | Admitting: Hospitalist

## 2023-10-31 DIAGNOSIS — G9341 Metabolic encephalopathy: Secondary | ICD-10-CM | POA: Diagnosis present

## 2023-10-31 DIAGNOSIS — I152 Hypertension secondary to endocrine disorders: Secondary | ICD-10-CM | POA: Diagnosis present

## 2023-10-31 DIAGNOSIS — I959 Hypotension, unspecified: Secondary | ICD-10-CM | POA: Diagnosis present

## 2023-10-31 DIAGNOSIS — Z79890 Hormone replacement therapy (postmenopausal): Secondary | ICD-10-CM

## 2023-10-31 DIAGNOSIS — Z833 Family history of diabetes mellitus: Secondary | ICD-10-CM | POA: Diagnosis not present

## 2023-10-31 DIAGNOSIS — F17210 Nicotine dependence, cigarettes, uncomplicated: Secondary | ICD-10-CM | POA: Diagnosis present

## 2023-10-31 DIAGNOSIS — Z794 Long term (current) use of insulin: Secondary | ICD-10-CM | POA: Diagnosis not present

## 2023-10-31 DIAGNOSIS — T1491XA Suicide attempt, initial encounter: Secondary | ICD-10-CM

## 2023-10-31 DIAGNOSIS — E1059 Type 1 diabetes mellitus with other circulatory complications: Secondary | ICD-10-CM | POA: Diagnosis present

## 2023-10-31 DIAGNOSIS — E103393 Type 1 diabetes mellitus with moderate nonproliferative diabetic retinopathy without macular edema, bilateral: Secondary | ICD-10-CM | POA: Diagnosis present

## 2023-10-31 DIAGNOSIS — E1069 Type 1 diabetes mellitus with other specified complication: Secondary | ICD-10-CM | POA: Diagnosis present

## 2023-10-31 DIAGNOSIS — Z560 Unemployment, unspecified: Secondary | ICD-10-CM

## 2023-10-31 DIAGNOSIS — E039 Hypothyroidism, unspecified: Secondary | ICD-10-CM | POA: Diagnosis present

## 2023-10-31 DIAGNOSIS — F1210 Cannabis abuse, uncomplicated: Secondary | ICD-10-CM | POA: Diagnosis present

## 2023-10-31 DIAGNOSIS — E876 Hypokalemia: Secondary | ICD-10-CM | POA: Diagnosis present

## 2023-10-31 DIAGNOSIS — Y905 Blood alcohol level of 100-119 mg/100 ml: Secondary | ICD-10-CM | POA: Diagnosis present

## 2023-10-31 DIAGNOSIS — E162 Hypoglycemia, unspecified: Secondary | ICD-10-CM

## 2023-10-31 DIAGNOSIS — E1042 Type 1 diabetes mellitus with diabetic polyneuropathy: Secondary | ICD-10-CM | POA: Diagnosis present

## 2023-10-31 DIAGNOSIS — Z23 Encounter for immunization: Secondary | ICD-10-CM

## 2023-10-31 DIAGNOSIS — R45851 Suicidal ideations: Secondary | ICD-10-CM | POA: Diagnosis not present

## 2023-10-31 DIAGNOSIS — T383X2A Poisoning by insulin and oral hypoglycemic [antidiabetic] drugs, intentional self-harm, initial encounter: Principal | ICD-10-CM | POA: Diagnosis present

## 2023-10-31 DIAGNOSIS — Z91128 Patient's intentional underdosing of medication regimen for other reason: Secondary | ICD-10-CM

## 2023-10-31 DIAGNOSIS — F1410 Cocaine abuse, uncomplicated: Secondary | ICD-10-CM | POA: Diagnosis present

## 2023-10-31 DIAGNOSIS — E785 Hyperlipidemia, unspecified: Secondary | ICD-10-CM | POA: Diagnosis present

## 2023-10-31 DIAGNOSIS — F1910 Other psychoactive substance abuse, uncomplicated: Secondary | ICD-10-CM | POA: Diagnosis present

## 2023-10-31 DIAGNOSIS — F329 Major depressive disorder, single episode, unspecified: Secondary | ICD-10-CM | POA: Diagnosis present

## 2023-10-31 DIAGNOSIS — Z808 Family history of malignant neoplasm of other organs or systems: Secondary | ICD-10-CM

## 2023-10-31 DIAGNOSIS — T381X6A Underdosing of thyroid hormones and substitutes, initial encounter: Secondary | ICD-10-CM | POA: Diagnosis present

## 2023-10-31 DIAGNOSIS — R45850 Homicidal ideations: Secondary | ICD-10-CM | POA: Diagnosis present

## 2023-10-31 DIAGNOSIS — Z9641 Presence of insulin pump (external) (internal): Secondary | ICD-10-CM | POA: Diagnosis present

## 2023-10-31 DIAGNOSIS — R680 Hypothermia, not associated with low environmental temperature: Secondary | ICD-10-CM | POA: Diagnosis present

## 2023-10-31 DIAGNOSIS — F1510 Other stimulant abuse, uncomplicated: Secondary | ICD-10-CM | POA: Diagnosis present

## 2023-10-31 DIAGNOSIS — F419 Anxiety disorder, unspecified: Secondary | ICD-10-CM | POA: Diagnosis present

## 2023-10-31 DIAGNOSIS — I10 Essential (primary) hypertension: Secondary | ICD-10-CM | POA: Diagnosis present

## 2023-10-31 DIAGNOSIS — F1010 Alcohol abuse, uncomplicated: Secondary | ICD-10-CM | POA: Diagnosis present

## 2023-10-31 DIAGNOSIS — F900 Attention-deficit hyperactivity disorder, predominantly inattentive type: Secondary | ICD-10-CM | POA: Diagnosis present

## 2023-10-31 DIAGNOSIS — E1065 Type 1 diabetes mellitus with hyperglycemia: Secondary | ICD-10-CM | POA: Diagnosis not present

## 2023-10-31 DIAGNOSIS — Z9151 Personal history of suicidal behavior: Secondary | ICD-10-CM

## 2023-10-31 DIAGNOSIS — Z79899 Other long term (current) drug therapy: Secondary | ICD-10-CM

## 2023-10-31 DIAGNOSIS — G8929 Other chronic pain: Secondary | ICD-10-CM | POA: Diagnosis present

## 2023-10-31 DIAGNOSIS — F17200 Nicotine dependence, unspecified, uncomplicated: Secondary | ICD-10-CM | POA: Diagnosis not present

## 2023-10-31 DIAGNOSIS — Z803 Family history of malignant neoplasm of breast: Secondary | ICD-10-CM

## 2023-10-31 DIAGNOSIS — T68XXXA Hypothermia, initial encounter: Principal | ICD-10-CM

## 2023-10-31 DIAGNOSIS — E10649 Type 1 diabetes mellitus with hypoglycemia without coma: Secondary | ICD-10-CM | POA: Diagnosis present

## 2023-10-31 DIAGNOSIS — Z653 Problems related to other legal circumstances: Secondary | ICD-10-CM

## 2023-10-31 LAB — URINALYSIS, ROUTINE W REFLEX MICROSCOPIC
Bacteria, UA: NONE SEEN
Bilirubin Urine: NEGATIVE
Glucose, UA: >=500 mg/dL — AB
Hgb urine dipstick: NEGATIVE
Ketones, ur: NEGATIVE mg/dL
Leukocytes,Ua: NEGATIVE
Nitrite: NEGATIVE
Protein, ur: NEGATIVE mg/dL
RBC / HPF: 0 RBC/hpf (ref 0–5)
Specific Gravity, Urine: 1.005 (ref 1.005–1.030)
Squamous Epithelial / HPF: 0 /HPF (ref 0–5)
WBC, UA: 0 WBC/hpf (ref 0–5)
pH: 5 (ref 5.0–8.0)

## 2023-10-31 LAB — CBG MONITORING, ED
Glucose-Capillary: 293 mg/dL — ABNORMAL HIGH (ref 70–99)
Glucose-Capillary: 112 mg/dL — ABNORMAL HIGH (ref 70–99)
Glucose-Capillary: 140 mg/dL — ABNORMAL HIGH (ref 70–99)
Glucose-Capillary: 173 mg/dL — ABNORMAL HIGH (ref 70–99)
Glucose-Capillary: 303 mg/dL — ABNORMAL HIGH (ref 70–99)
Glucose-Capillary: 85 mg/dL (ref 70–99)
Glucose-Capillary: 97 mg/dL (ref 70–99)
Glucose-Capillary: 124 mg/dL — ABNORMAL HIGH (ref 70–99)
Glucose-Capillary: 124 mg/dL — ABNORMAL HIGH (ref 70–99)
Glucose-Capillary: 153 mg/dL — ABNORMAL HIGH (ref 70–99)
Glucose-Capillary: 162 mg/dL — ABNORMAL HIGH (ref 70–99)

## 2023-10-31 LAB — CBC WITH DIFFERENTIAL/PLATELET
Abs Immature Granulocytes: 0.03 K/uL (ref 0.00–0.07)
Basophils Absolute: 0 K/uL (ref 0.0–0.1)
Basophils Relative: 1 %
Eosinophils Absolute: 0.1 K/uL (ref 0.0–0.5)
Eosinophils Relative: 1 %
HCT: 40.1 % (ref 39.0–52.0)
Hemoglobin: 13.3 g/dL (ref 13.0–17.0)
Immature Granulocytes: 1 %
Lymphocytes Relative: 25 %
Lymphs Abs: 1.6 K/uL (ref 0.7–4.0)
MCH: 31.3 pg (ref 26.0–34.0)
MCHC: 33.2 g/dL (ref 30.0–36.0)
MCV: 94.4 fL (ref 80.0–100.0)
Monocytes Absolute: 0.5 K/uL (ref 0.1–1.0)
Monocytes Relative: 7 %
Neutro Abs: 4.3 K/uL (ref 1.7–7.7)
Neutrophils Relative %: 65 %
Platelets: 252 K/uL (ref 150–400)
RBC: 4.25 MIL/uL (ref 4.22–5.81)
RDW: 13 % (ref 11.5–15.5)
WBC: 6.5 K/uL (ref 4.0–10.5)
nRBC: 0 % (ref 0.0–0.2)

## 2023-10-31 LAB — MRSA NEXT GEN BY PCR, NASAL: MRSA by PCR Next Gen: NOT DETECTED

## 2023-10-31 LAB — URINE DRUG SCREEN, QUALITATIVE (ARMC ONLY)
Amphetamines, Ur Screen: POSITIVE — AB
Barbiturates, Ur Screen: NOT DETECTED
Benzodiazepine, Ur Scrn: NOT DETECTED
Cannabinoid 50 Ng, Ur ~~LOC~~: POSITIVE — AB
Cocaine Metabolite,Ur ~~LOC~~: POSITIVE — AB
MDMA (Ecstasy)Ur Screen: NOT DETECTED
Methadone Scn, Ur: NOT DETECTED
Opiate, Ur Screen: NOT DETECTED
Phencyclidine (PCP) Ur S: NOT DETECTED
Tricyclic, Ur Screen: NOT DETECTED

## 2023-10-31 LAB — BASIC METABOLIC PANEL WITH GFR
Anion gap: 11 (ref 5–15)
BUN: 8 mg/dL (ref 6–20)
CO2: 24 mmol/L (ref 22–32)
Calcium: 8.7 mg/dL — ABNORMAL LOW (ref 8.9–10.3)
Chloride: 105 mmol/L (ref 98–111)
Creatinine, Ser: 0.99 mg/dL (ref 0.61–1.24)
GFR, Estimated: >60 mL/min
Glucose, Bld: 160 mg/dL — ABNORMAL HIGH (ref 70–99)
Potassium: 3 mmol/L — ABNORMAL LOW (ref 3.5–5.1)
Sodium: 140 mmol/L (ref 135–145)

## 2023-10-31 LAB — GLUCOSE, CAPILLARY
Glucose-Capillary: 12 mg/dL — CL (ref 70–99)
Glucose-Capillary: 215 mg/dL — ABNORMAL HIGH (ref 70–99)
Glucose-Capillary: 178 mg/dL — ABNORMAL HIGH (ref 70–99)
Glucose-Capillary: 424 mg/dL — ABNORMAL HIGH (ref 70–99)

## 2023-10-31 LAB — ACETAMINOPHEN LEVEL: Acetaminophen (Tylenol), Serum: <10 ug/mL — ABNORMAL LOW (ref 10–30)

## 2023-10-31 LAB — HEMOGLOBIN A1C
Hgb A1c MFr Bld: 9.8 % — ABNORMAL HIGH (ref 4.8–5.6)
Mean Plasma Glucose: 234.56 mg/dL

## 2023-10-31 LAB — ETHANOL: Alcohol, Ethyl (B): 112 mg/dL — ABNORMAL HIGH (ref <15)

## 2023-10-31 LAB — LACTIC ACID, PLASMA
Lactic Acid, Venous: 2.8 mmol/L (ref 0.5–1.9)
Lactic Acid, Venous: 2 mmol/L (ref 0.5–1.9)

## 2023-10-31 LAB — SALICYLATE LEVEL: Salicylate Lvl: <7 mg/dL — ABNORMAL LOW (ref 7.0–30.0)

## 2023-10-31 LAB — TSH: TSH: 61 u[IU]/mL — ABNORMAL HIGH (ref 0.350–4.500)

## 2023-10-31 LAB — POTASSIUM: Potassium: 4.5 mmol/L (ref 3.5–5.1)

## 2023-10-31 MED ORDER — MORPHINE SULFATE (PF) 2 MG/ML IV SOLN
2.0000 mg | INTRAVENOUS | Status: DC | PRN
  Administered 2023-11-01 – 2023-11-02 (×4): 2 mg via INTRAVENOUS
  Filled 2023-10-31 (×4): qty 1

## 2023-10-31 MED ORDER — ACETAMINOPHEN 325 MG PO TABS: 650.0000 mg | ORAL_TABLET | Freq: Four times a day (QID) | ORAL | Status: DC | PRN

## 2023-10-31 MED ORDER — FOLIC ACID 1 MG PO TABS
1.0000 mg | ORAL_TABLET | Freq: Every day | ORAL | Status: DC
  Administered 2023-10-31 – 2023-11-06 (×7): 1 mg via ORAL
  Filled 2023-10-31 (×6): qty 1

## 2023-10-31 MED ORDER — LORAZEPAM 1 MG PO TABS: 1.0000 mg | ORAL_TABLET | ORAL | Status: AC | PRN

## 2023-10-31 MED ORDER — LEVOTHYROXINE SODIUM 50 MCG PO TABS
125.0000 ug | ORAL_TABLET | Freq: Once | ORAL | Status: AC
  Administered 2023-10-31: 125 ug via ORAL
  Filled 2023-10-31: qty 3

## 2023-10-31 MED ORDER — FOLIC ACID 1 MG PO TABS
1.0000 mg | ORAL_TABLET | Freq: Every day | ORAL | Status: DC
  Filled 2023-10-31: qty 1

## 2023-10-31 MED ORDER — DIPHENHYDRAMINE HCL 50 MG/ML IJ SOLN
50.0000 mg | Freq: Once | INTRAMUSCULAR | Status: AC
  Administered 2023-10-31: 50 mg via INTRAVENOUS

## 2023-10-31 MED ORDER — POTASSIUM CHLORIDE 10 MEQ/100ML IV SOLN
10.0000 meq | INTRAVENOUS | Status: AC
  Administered 2023-10-31 (×4): 10 meq via INTRAVENOUS
  Filled 2023-10-31 (×4): qty 100

## 2023-10-31 MED ORDER — ORAL CARE MOUTH RINSE: 15.0000 mL | OROMUCOSAL | Status: DC | PRN

## 2023-10-31 MED ORDER — ACETAMINOPHEN 650 MG RE SUPP: 650.0000 mg | Freq: Four times a day (QID) | RECTAL | Status: DC | PRN

## 2023-10-31 MED ORDER — DEXTROSE 50 % IV SOLN
1.0000 | Freq: Once | INTRAVENOUS | Status: AC
  Administered 2023-10-31: 50 mL via INTRAVENOUS

## 2023-10-31 MED ORDER — INSULIN ASPART 100 UNIT/ML IJ SOLN
0.0000 [IU] | Freq: Three times a day (TID) | INTRAMUSCULAR | Status: DC
  Administered 2023-10-31: 1 [IU] via SUBCUTANEOUS
  Administered 2023-10-31: 2 [IU] via SUBCUTANEOUS
  Administered 2023-10-31: 6 [IU] via SUBCUTANEOUS
  Filled 2023-10-31: qty 1

## 2023-10-31 MED ORDER — DEXTROSE 50 % IV SOLN: 50.0000 mL | Freq: Once | INTRAVENOUS | Status: AC

## 2023-10-31 MED ORDER — SODIUM CHLORIDE 0.9 % IV BOLUS
1000.0000 mL | Freq: Once | INTRAVENOUS | Status: AC
  Administered 2023-10-31: 1000 mL via INTRAVENOUS

## 2023-10-31 MED ORDER — SODIUM CHLORIDE 0.9 % IV SOLN: Freq: Once | INTRAVENOUS | Status: AC

## 2023-10-31 MED ORDER — LORAZEPAM 2 MG/ML IJ SOLN
1.0000 mg | Freq: Once | INTRAMUSCULAR | Status: AC
  Administered 2023-10-31: 1 mg via INTRAVENOUS

## 2023-10-31 MED ORDER — DEXTROSE 10 % IV SOLN: INTRAVENOUS | Status: DC

## 2023-10-31 MED ORDER — POTASSIUM CHLORIDE 10 MEQ/100ML IV SOLN
10.0000 meq | INTRAVENOUS | Status: AC
  Filled 2023-10-31 (×3): qty 100

## 2023-10-31 MED ORDER — HALOPERIDOL LACTATE 5 MG/ML IJ SOLN
5.0000 mg | Freq: Once | INTRAMUSCULAR | Status: AC
  Administered 2023-10-31: 5 mg via INTRAVENOUS

## 2023-10-31 MED ORDER — INSULIN GLARGINE 100 UNIT/ML ~~LOC~~ SOLN
20.0000 [IU] | Freq: Every day | SUBCUTANEOUS | Status: DC
  Administered 2023-10-31: 20 [IU] via SUBCUTANEOUS
  Filled 2023-10-31 (×2): qty 0.2

## 2023-10-31 MED ORDER — ENOXAPARIN SODIUM 40 MG/0.4ML IJ SOSY
40.0000 mg | PREFILLED_SYRINGE | INTRAMUSCULAR | Status: DC
  Administered 2023-10-31 – 2023-11-05 (×6): 40 mg via SUBCUTANEOUS
  Filled 2023-10-31 (×6): qty 0.4

## 2023-10-31 MED ORDER — LORAZEPAM 2 MG/ML IJ SOLN: 1.0000 mg | INTRAMUSCULAR | Status: AC | PRN

## 2023-10-31 MED ORDER — SODIUM CHLORIDE 0.9 % IV SOLN: INTRAVENOUS | Status: DC

## 2023-10-31 MED ORDER — CHLORHEXIDINE GLUCONATE CLOTH 2 % EX PADS
6.0000 | MEDICATED_PAD | Freq: Every day | CUTANEOUS | Status: DC
  Administered 2023-10-31 – 2023-11-06 (×3): 6 via TOPICAL

## 2023-10-31 MED ORDER — OXYCODONE HCL 5 MG PO TABS
5.0000 mg | ORAL_TABLET | ORAL | Status: DC | PRN
  Administered 2023-11-01 – 2023-11-04 (×6): 5 mg via ORAL
  Filled 2023-10-31 (×6): qty 1

## 2023-10-31 MED ORDER — POLYETHYLENE GLYCOL 3350 17 G PO PACK: 17.0000 g | PACK | Freq: Every day | ORAL | Status: DC | PRN

## 2023-10-31 MED ORDER — SODIUM CHLORIDE 0.9 % IV SOLN
2.0000 g | Freq: Once | INTRAVENOUS | Status: AC
  Administered 2023-10-31: 2 g via INTRAVENOUS
  Filled 2023-10-31: qty 12.5

## 2023-10-31 MED ORDER — THIAMINE HCL 100 MG/ML IJ SOLN
100.0000 mg | Freq: Every day | INTRAMUSCULAR | Status: DC
  Filled 2023-10-31: qty 2

## 2023-10-31 MED ORDER — THIAMINE MONONITRATE 100 MG PO TABS
100.0000 mg | ORAL_TABLET | Freq: Every day | ORAL | Status: DC
  Filled 2023-10-31: qty 1

## 2023-10-31 MED ORDER — LORAZEPAM 2 MG/ML IJ SOLN
1.0000 mg | Freq: Once | INTRAMUSCULAR | Status: AC
  Administered 2023-10-31: 1 mg via INTRAVENOUS
  Filled 2023-10-31: qty 1

## 2023-10-31 MED ORDER — ONDANSETRON HCL 4 MG PO TABS: 4.0000 mg | ORAL_TABLET | Freq: Four times a day (QID) | ORAL | Status: DC | PRN

## 2023-10-31 MED ORDER — PANTOPRAZOLE SODIUM 40 MG IV SOLR
40.0000 mg | Freq: Two times a day (BID) | INTRAVENOUS | Status: DC
  Administered 2023-10-31 – 2023-11-01 (×3): 40 mg via INTRAVENOUS
  Filled 2023-10-31 (×3): qty 10

## 2023-10-31 MED ORDER — VANCOMYCIN HCL IN DEXTROSE 1-5 GM/200ML-% IV SOLN
1000.0000 mg | Freq: Once | INTRAVENOUS | Status: AC
  Administered 2023-10-31: 1000 mg via INTRAVENOUS
  Filled 2023-10-31: qty 200

## 2023-10-31 MED ORDER — ADULT MULTIVITAMIN W/MINERALS CH
1.0000 | ORAL_TABLET | Freq: Every day | ORAL | Status: DC
  Administered 2023-10-31 – 2023-11-06 (×7): 1 via ORAL
  Filled 2023-10-31 (×7): qty 1

## 2023-10-31 MED ORDER — LEVOTHYROXINE SODIUM 50 MCG PO TABS
250.0000 ug | ORAL_TABLET | Freq: Every day | ORAL | Status: DC
  Administered 2023-10-31 – 2023-11-06 (×7): 250 ug via ORAL
  Filled 2023-10-31 (×7): qty 1

## 2023-10-31 MED ORDER — THIAMINE MONONITRATE 100 MG PO TABS
100.0000 mg | ORAL_TABLET | Freq: Every day | ORAL | Status: DC
  Administered 2023-10-31 – 2023-11-06 (×7): 100 mg via ORAL
  Filled 2023-10-31 (×6): qty 1

## 2023-10-31 MED ORDER — DEXTROSE 50 % IV SOLN
INTRAVENOUS | Status: AC
  Administered 2023-10-31: 50 mL via INTRAVENOUS
  Filled 2023-10-31: qty 50

## 2023-10-31 MED ORDER — ONDANSETRON HCL 4 MG/2ML IJ SOLN
4.0000 mg | Freq: Four times a day (QID) | INTRAMUSCULAR | Status: DC | PRN
Start: 2023-10-31 — End: 2023-11-06

## 2023-10-31 MED ORDER — KCL IN DEXTROSE-NACL 20-5-0.45 MEQ/L-%-% IV SOLN
INTRAVENOUS | Status: DC
  Filled 2023-10-31: qty 1000

## 2023-10-31 NOTE — ED Provider Notes (Signed)
 [facility] Provider Note    Event Date/Time   First MD Initiated Contact with Patient 10/31/23 0327     (approximate)   History   Unresponsive    HPI  Ronald Banks is a 48 y.o. male who presents to the emergency department today by private vehicle.  Patient was driven in by a friend who been with him throughout the night.  He states that they had been out drinking.  The patient had been in his normal state of health at the start of the night.  However started complaining that he would feel good wanted be taken at home.  While the friend was driving him home he became unresponsive and the friend drove him to the emergency department.     Physical Exam   Triage Vital Signs: ED Triage Vitals  Encounter Vitals Group     BP 10/31/23 0332 110/62     Girls Systolic BP Percentile --      Girls Diastolic BP Percentile --      Boys Systolic BP Percentile --      Boys Diastolic BP Percentile --      Pulse Rate 10/31/23 0332 63     Resp 10/31/23 0332 18     Temp --      Temp src --      SpO2 10/31/23 0332 97 %     Weight 10/31/23 0334 196 lb 3.4 oz (89 kg)     Height --      Head Circumference --      Peak Flow --      Pain Score --      Pain Loc --      Pain Education --      Exclude from Growth Chart --     Most recent vital signs: Vitals:   10/31/23 0332  BP: 110/62  Pulse: 63  Resp: 18  SpO2: 97%   General: Unresponsive CV:  Good peripheral perfusion. *** Resp:  Normal effort. *** Abd:  No distention. *** Other:  ***   ED Results / Procedures / Treatments   Labs (all labs ordered are listed, but only abnormal results are displayed) Labs Reviewed  CBG MONITORING, ED - Abnormal; Notable for the following components:      Result Value   Glucose-Capillary 293 (*)    All other components within normal limits  CBG MONITORING, ED - Abnormal; Notable for the following components:   Glucose-Capillary 173 (*)    All other components  within normal limits  CULTURE, BLOOD (ROUTINE X 2)  CULTURE, BLOOD (ROUTINE X 2)  CBC WITH DIFFERENTIAL/PLATELET  BASIC METABOLIC PANEL WITH GFR  ETHANOL  URINE DRUG SCREEN, QUALITATIVE ([facility] ONLY)  LACTIC ACID, PLASMA  LACTIC ACID, PLASMA     EKG  ***   RADIOLOGY *** {USE THE WORD INTERPRETED!! You MUST document your own interpretation of imaging, as well as the fact that you reviewed the radiologist's report!:1}   PROCEDURES:  Critical Care performed: Yes  CRITICAL CARE Performed by: Guadalupe Eagles   Total critical care time: *** minutes  Critical care time was exclusive of separately billable procedures and treating other patients.  Critical care was necessary to treat or prevent imminent or life-threatening deterioration.  Critical care was time spent personally by me on the following activities: development of treatment plan with patient and/or surrogate as well as nursing, discussions with consultants, evaluation of patient's response to treatment, examination of patient, obtaining history from  patient or surrogate, ordering and performing treatments and interventions, ordering and review of laboratory studies, ordering and review of radiographic studies, pulse oximetry and re-evaluation of patient's condition.   Procedures    MEDICATIONS ORDERED IN ED: Medications  LORazepam (ATIVAN) injection 1 mg (1 mg Intravenous Given 10/31/23 0323)  LORazepam (ATIVAN) injection 1 mg (1 mg Intravenous Given 10/31/23 0324)  dextrose  50 % solution 50 mL (50 mLs Intravenous Given 10/31/23 0328)  dextrose  50 % solution 50 mL (50 mLs Intravenous Given 10/31/23 0325)     IMPRESSION / MDM / ASSESSMENT AND PLAN / ED COURSE  I reviewed the triage vital signs and the nursing notes.                              Differential diagnosis includes, but is not limited to, ***  Patient's presentation is most consistent with {EM COPA:27473}   ***The patient is on the cardiac  monitor to evaluate for evidence of arrhythmia and/or significant heart rate changes.  ***      FINAL CLINICAL IMPRESSION(S) / ED DIAGNOSES   Final diagnoses:  None        Rx / DC Orders   ED Discharge Orders     None        Note:  This document was prepared using Dragon voice recognition software and may include unintentional dictation errors.

## 2023-10-31 NOTE — ED Triage Notes (Addendum)
 Pt presented to ED brought POV by friend. Pt initially refusing to get out of car, visible diaphoretic, refusing to answer questions. BGL 12 initially, transferred onto stretcher to room 10, Dextrose  25G given at 0317. Second dose 25G 0320. Pt stating no one cares about me, I wanna die. Pt aggressive attempting to hit staff. Floy MD at bedside. Pt denies intentional overdose of insulin - per insulin  pump 10 unit bolus given at 0118.   Ativan 2mg  given at 0323 by Nathanel PEAK per Floy MD Benadryl  50mg  and Haldol 5mg  given at 0327 by Nathanel PEAK per Floy MD

## 2023-10-31 NOTE — Consult Note (Signed)
 [facility] Psychiatric Consult Initial  Patient Name: .Ronald Banks  MRN: [id number]  DOB: Nov 25, 1975  Consult Order details:  Orders (From admission, onward)     Start     Ordered   10/31/23 1001  IP CONSULT TO PSYCHIATRY       Ordering Provider: Roann Gouty, MD  Provider:  (Not yet assigned)  Question Answer Comment  Location [facility]   Reason for Consult? Depression?  Suicidal intent with insulin       10/31/23 1000             Mode of Visit: In person    Psychiatry Consult Evaluation  Service Date: October 31, 2023 LOS:  LOS: 0 days  Chief Complaint Intentional insulin  overdose  Primary Psychiatric Diagnoses  Suicide attempt   Assessment  Ronald Banks is a 48 y.o. male admitted: Medically   Per MD note Ronald Banks is a pleasant 48 y.o. male with medical history significant for type 1 diabetes on insulin  pump, HTN, HLD, hypothyroidism, depression, tobacco dependence, polysubstance abuse who presented to ED today via private vehicle for acute change in mental status/unresponsiveness in the vehicle.  Patient was driven by a friend who was with the patient throughout the night.  Patient was  in his normal state until last night.  Patient and his friend were drinking out and did not feel well and wanted to be taken home.  While the friend was driving him home he became unresponsive and the friend drove him to the emergency room at [facility].  Patient is not able to provide meaningful history.  He is sleepy, follows minor, and but not able to provide history.   On assessment today, patient was noted to be lying in bed.  He was very tearful during interview and could not respond to some of the assessment questions due to emotional status.  He did endorse intentionally taking insulin  prior to his arrival to the emergency department in an attempt to kill self.  He reported he did not tell his friend what he did and that his friend drove him to the  emergency department because he became unresponsive.  He endorsed homicidal ideations but would not elaborate on who or if he had a plan.  He did report the homicidal ideations were due to a certain situation.  Patient denied any visual or auditory hallucinations, but when asked about ability to receive special messages from TV or radio's patient stated maybe.  Patient was calm and cooperative throughout this assessment.  Due to the intentional overdose and homicidal ideations, patient is a risk to self and others.  Recommendation to continue IVC and once patient is medically stable, we recommend inpatient psychiatric admission.  This was discussed with patient who verbalized understanding.  Diagnoses:  Active Hospital problems: Principal Problem:   Hypoglycemia Active Problems:   Hypothyroidism   Essential hypertension   Tobacco dependence   Type 1 diabetes mellitus with diabetic polyneuropathy   Polysubstance abuse (HCC)   ETOH abuse   Suicide attempt [facility])    Plan   ## Psychiatric Medication Recommendations:  -awaiting medical stabilization for medication recommendations  ## Medical Decision Making Capacity: Not specifically addressed in this encounter  ## Further Work-up:   -- most recent EKG on 10/31/2023/ had QtC of 459 -- Pertinent labwork reviewed earlier this admission includes: CBG, CBC, BMP, ethanol, acetaminophen  level, salicylate level, urine drug screen   ## Disposition:-- We recommend inpatient psychiatric hospitalization after medical hospitalization.  Patient has been involuntarily committed on 10/31/2023.   ## Behavioral / Environmental: -Utilize compassion and acknowledge the patient's experiences while setting clear and realistic expectations for care.    ## Safety and Observation Level:  - Based on my clinical evaluation, I estimate the patient to be at high risk of self harm in the current setting. - At this time, we recommend  1:1 Observation. This decision  is based on my review of the chart including patient's history and current presentation, interview of the patient, mental status examination, and consideration of suicide risk including evaluating suicidal ideation, plan, intent, suicidal or self-harm behaviors, risk factors, and protective factors. This judgment is based on our ability to directly address suicide risk, implement suicide prevention strategies, and develop a safety plan while the patient is in the clinical setting. Please contact our team if there is a concern that risk level has changed.  CSSR Risk Category:C-SSRS RISK CATEGORY: High Risk  Suicide Risk Assessment: Patient has following modifiable risk factors for suicide: active suicidal ideation, lack of access to outpatient mental health resources, and triggering events, which we are addressing by recommending inpatient admission after medical stabilization. Patient has following non-modifiable or demographic risk factors for suicide: male gender and history of suicide attempt Patient has the following protective factors against suicide: Supportive friends  Thank you for this consult request. Recommendations have been communicated to the primary team.  We will continue to assess for inpatient psychiatric admission- At this time, it does not appear patient is medically stable enough, physically wise, for our inpatient psychiatric unit at this current time.   Zelda Sharps, NP        History of Present Illness  Relevant Aspects of [facility]   Patient Report:  On assessment today, patient was noted to be lying in bed.  He was very tearful during interview and could not respond to some of the assessment questions due to emotional status.  He did endorse intentionally taking insulin  prior to his arrival to the emergency department in an attempt to kill self.  He reported he did not tell his friend what he did and that his friend drove him to the emergency department because he  became unresponsive.  He endorsed homicidal ideations but would not elaborate on who or if he had a plan.  He did report the homicidal ideations were due to a certain situation.  Patient denied any visual or auditory hallucinations, but when asked about ability to receive special messages from TV or radio's patient stated maybe.  Patient was calm and cooperative throughout this assessment.  Due to the intentional overdose and homicidal ideations, patient is a risk to self and others.  Recommendation to continue IVC and once patient is medically stable, we recommend inpatient psychiatric admission.  This was discussed with patient who verbalized understanding.  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  He reported living at home with his mother.  He reports getting along with his mother sometimes.  He did endorse having pending legal charges and an upcoming court date but did not wish to elaborate further.  When asked about alcohol use, patient reported he did not drink very often.  He reported when he does drink he drinks a couple of beers at a time.  He did endorse occasional THC use and reported he smokes cigarettes daily.  He denied any other illicit drug use.  He endorsed multiple previous attempts at suicide.  He denied any other previous mental health hospitalizations to his knowledge.  He reported taking medications for mental health reasons a long time ago, but could not remember what these medications were.  He also was unsure if he had any previous mental health diagnoses.  He denied any history of violence.  He denied any family history of any mental health conditions or family history of suicide.  Patient was cooperative during assessment, but was noted to get very tearful during some assessment questions.  Patient noted to have depressed affect during assessment as well.  Psych ROS:  Depression: 8/10 Anxiety:  8/10 Mania (lifetime  and current): Denied Psychosis: (lifetime and current): Denied      Psychiatric and Social History  Psychiatric History:  Information collected from Patient/chart review  Prev Dx/Sx: Polysubstance abuse, depression Current Psych Provider: None Home Meds (current): None Previous Med Trials: Unsure Therapy: None  Prior Psych Hospitalization: Denied  Prior Self Harm: Yes Prior Violence: Denied  Family Psych History: Denied Family Hx suicide: Denied  Social History:    Occupational Hx: Unemployed Armed forces operational officer Hx: Upcoming court date Living Situation: Lives with mother  Access to weapons/lethal means: Denied access to firearms   Substance History Alcohol: Occasional   Type of alcohol Beer Last Drink Prior to arrival to ED  History of alcohol withdrawal seizures Denied History of DT's Denied Tobacco: Denied Illicit drugs: Denied- UDS positive for cocaine and amphetamines Prescription drug abuse: Denied Rehab hx: Denied  Exam Findings  Physical Exam: Medical MD note reviewed Vital Signs:  Temp:  [94.5 F (34.7 C)-98.4 F (36.9 C)] 98.4 F (36.9 C) (10/08 0842) Pulse Rate:  [55-76] 71 (10/08 1400) Resp:  [10-22] 12 (10/08 1400) BP: (88-129)/(54-98) 109/76 (10/08 1400) SpO2:  [95 %-100 %] 98 % (10/08 1400) Weight:  [85.3 kg-89 kg] 85.3 kg (10/08 0842) Blood pressure 109/76, pulse 71, temperature 98.4 F (36.9 C), temperature source Oral, resp. rate 12, height 5' 10 (1.778 m), weight 85.3 kg, SpO2 98%. Body mass index is 26.98 kg/m.    Mental Status Exam: General Appearance: Casual  Orientation:  Full (Time, Place, and Person)  Memory:  Immediate;   Fair Recent;   Fair Remote;   Fair  Concentration:  Concentration: Fair and Attention Span: Fair  Recall:  Fair  Attention  Fair  Eye Contact:  Fair  Speech:  Clear and Coherent  Language:  Fair  Volume:  Decreased  Mood: not okay  Affect:  Depressed  Thought Process:  Coherent  Thought Content:  Responded  maybe to getting special messages from TV/radios  Suicidal Thoughts:  Yes.  with intent/plan  Homicidal Thoughts:  Yes.  without intent/plan  Judgement:  Impaired  Insight:  Lacking  Psychomotor Activity:  Normal  Akathisia:  No  Fund of Knowledge:  Fair      Assets:  Communication Skills  Cognition:  WNL  ADL's:  Intact  AIMS (if indicated):        Other History   These have been pulled in through the EMR, reviewed, and updated if appropriate.  Family History:  The patient's family history includes Cancer in his mother; Diabetes in his maternal grandmother, mother, and sister.  Medical History: Past Medical History:  Diagnosis Date   ADHD (attention deficit hyperactivity disorder), inattentive type    Chronic midline low back pain with right-sided sciatica 02/11/2018   Chronic right shoulder pain 02/11/2018   DKA (diabetic ketoacidosis) 08/17/2020   DOE (dyspnea on exertion) 05/19/2019   Onset  2015/16 05/19/2019   Walked RA x two laps =  approx 559ft @ brisk pace - stopped due to end of study with sats of 98 % at the end of the study and c/o sob s cp  > rec CPST next  - alpha one AT  Screen   05/19/19 MM  152  - CPST  06/30/19  FEV1 3.79 (93%) with ratio 0.73 and nl f/v loop: ex study  probabably  wnl when wt factored  In  - sob with ventilatory reserve suggested sub max effort and de   Hyperlipidemia due to type 1 diabetes mellitus 04/18/2018   Hypertension associated with diabetes 04/18/2018   Trial off acei 04/07/2019 due to cough > improved by 07/03/2019 with just a typical smoker's rattle (no longer cough to point of gag/vomit)   Hypothyroidism    Major depressive disorder 10/09/2019   Moderate nonproliferative diabetic retinopathy of both eyes (HCC) 05/19/2019   Tobacco dependence 10/09/2019   Type 1 diabetes mellitus with diabetic polyneuropathy 04/18/2018    Surgical History: Past Surgical History:  Procedure Laterality Date   DENTAL SURGERY     EAR CYST EXCISION Right  03/24/2020   Procedure: EXCISION PRE-AURICULAR CYST;  Surgeon: Milissa Hamming, MD;  Location: [facility];  Service: ENT;  Laterality: Right;  diabetic insulin  pump   PREAURICULAR CYST EXCISION Left 07/07/2020   Procedure: EXCISION PREAURICULAR CYST ADULT;  Surgeon: Milissa Hamming, MD;  Location: [facility];  Service: ENT;  Laterality: Left;     Medications:   Current Facility-Administered Medications:    0.9 %  sodium chloride  infusion, , Intravenous, Continuous, Paudel, Keshab, MD, Last Rate: 125 mL/hr at 10/31/23 1056, New Bag at 10/31/23 1056   acetaminophen  (TYLENOL ) tablet 650 mg, 650 mg, Oral, Q6H PRN **OR** acetaminophen  (TYLENOL ) suppository 650 mg, 650 mg, Rectal, Q6H PRN, Paudel, Nena, MD   Chlorhexidine Gluconate Cloth 2 % PADS 6 each, 6 each, Topical, Daily, Paudel, Keshab, MD, 6 each at 10/31/23 1000   dextrose  10 % infusion, , Intravenous, Continuous, Goodman, Graydon, MD, Stopping previously hung infusion at 10/31/23 0820   enoxaparin  (LOVENOX ) injection 40 mg, 40 mg, Subcutaneous, Q24H, Paudel, Keshab, MD   folic acid (FOLVITE) tablet 1 mg, 1 mg, Oral, Daily, Paudel, Keshab, MD, 1 mg at 10/31/23 1059   insulin  aspart (novoLOG ) injection 0-6 Units, 0-6 Units, Subcutaneous, TID WC, Paudel, Keshab, MD, 2 Units at 10/31/23 0925   insulin  glargine (LANTUS) injection 20 Units, 20 Units, Subcutaneous, Daily, Paudel, Keshab, MD, 20 Units at 10/31/23 1104   levothyroxine  (SYNTHROID ) tablet 250 mcg, 250 mcg, Oral, Daily, Paudel, Keshab, MD, 250 mcg at 10/31/23 1058   LORazepam (ATIVAN) tablet 1-4 mg, 1-4 mg, Oral, Q1H PRN **OR** LORazepam (ATIVAN) injection 1-4 mg, 1-4 mg, Intravenous, Q1H PRN, Paudel, Nena, MD   morphine  (PF) 2 MG/ML injection 2 mg, 2 mg, Intravenous, Q4H PRN, Paudel, Keshab, MD   multivitamin with minerals tablet 1 tablet, 1 tablet, Oral, Daily, Paudel, Keshab, MD, 1 tablet at 10/31/23 1058   ondansetron  (ZOFRAN ) tablet 4 mg, 4 mg, Oral, Q6H PRN  **OR** ondansetron  (ZOFRAN ) injection 4 mg, 4 mg, Intravenous, Q6H PRN, Paudel, Keshab, MD   Oral care mouth rinse, 15 mL, Mouth Rinse, PRN, Paudel, Keshab, MD   oxyCODONE  (Oxy IR/ROXICODONE ) immediate release tablet 5 mg, 5 mg, Oral, Q4H PRN, Paudel, Keshab, MD   pantoprazole  (PROTONIX ) injection 40 mg, 40 mg, Intravenous, Q12H, Paudel, Keshab, MD, 40 mg at 10/31/23 1059   polyethylene glycol (  MIRALAX  / GLYCOLAX ) packet 17 g, 17 g, Oral, Daily PRN, Paudel, Keshab, MD   thiamine (VITAMIN B1) tablet 100 mg, 100 mg, Oral, Daily, 100 mg at 10/31/23 1059 **OR** thiamine (VITAMIN B1) injection 100 mg, 100 mg, Intravenous, Daily, Paudel, Keshab, MD  Allergies: No Known Allergies  Zelda Sharps, NP

## 2023-10-31 NOTE — ED Notes (Signed)
Pt sleeping at this time. Respirations even and unlabored.

## 2023-10-31 NOTE — ED Notes (Signed)
 Fall risk protocol in put in place

## 2023-10-31 NOTE — Progress Notes (Cosign Needed)
 Psychiatry attempted to round on patient. Patient was noted to be resting in bed. Per nurse, patient received agitation medications in the emergency department early this morning. Psychiatry will attempt to assess patient later when patient able to participate in assessment.

## 2023-10-31 NOTE — Inpatient Diabetes Management (Signed)
 Inpatient Diabetes Program Recommendations  AACE/ADA: New Consensus Statement on Inpatient Glycemic Control   Target Ranges:  Prepandial:   less than 140 mg/dL      Peak postprandial:   less than 180 mg/dL (1-2 hours)      Critically ill patients:  140 - 180 mg/dL    Latest Reference Range & Units 10/31/23 05:11 10/31/23 05:39 10/31/23 06:02 10/31/23 06:29 10/31/23 07:38 10/31/23 08:40  Glucose-Capillary 70 - 99 mg/dL 696 (H) 846 (H) 875 (H) 124 (H) 162 (H) 215 (H)   10/25/2023: A1c = 10.2%  Review of Glycemic Control  Diabetes history: DM1  Outpatient Diabetes medications:   Per office visit note on 10/25/23 with Dr Cherilyn -   NovoLog  in the T slim control IQ pump:  Basal rates Midnight = 1.4 4 AM = 1.5 8 AM = 1.35 1 PM = 1.45 TDD basal: 34.3 units  Bolus settings I/C: 8 ISF: 40 Target Glucose: 110 Active insulin  time: 5 hours  Total Daily dose approximately 40 units. 84% is basal.   Note: Patient recently seen by Dr Cherilyn on 10/25/23; however, prior to this visit, patient had not seen provider since 04/27/22.   Current orders for Inpatient glycemic control: none   Inpatient Diabetes Program Recommendations:   **Note: patient's pump was removed at 0414 this morning in ED. Patient has not received insulin . Being a Type 1 diabetic, patient needs basal and bolus insulin  to prevent DKA, made MD aware.**  Please consider starting Lantus 20 units daily and Novolog  0-9 units q4hrs.   If patient is alert and oriented please consider stopping Dextrose  in IV fluids.   Thanks,  Lavanda Search, RN, MSN, [facility]  Inpatient Diabetes Coordinator  Pager [phone] (8a-5p)

## 2023-10-31 NOTE — ED Notes (Signed)
 This RN notified Floy, MD of 2.8 lactic acid reported by lab

## 2023-10-31 NOTE — ED Notes (Signed)
 Donny, RN place bear hugger put on pt

## 2023-10-31 NOTE — ED Notes (Signed)
 This RN with Donny, RN place pt in trendelenburg position.

## 2023-10-31 NOTE — ED Notes (Addendum)
 THIS RN WITH DORIAN, RN CHANGED PT INTO BURGUNDY SCRUBS PER FACILITY PROTOCOL BELONGINGS PLACED IN SINGLE BELONGING LABEL BAG. BELONGINGS INCLUDE PAIR OF BOOTS, 2 SOCKS, HAT, PURPLE T SHIRT, WALLET (NO CASH, MISC CARD), GLASSES, VAPE, BELT, JEAN SHORTS, CELL PHONE, INSULIN  PUMP, NERDS CANDY, LIGHTER X2, CONTACT CASE, GLASS CYLINDER, POCKET KNIFE, UNDERWEAR, NECKLACE.

## 2023-10-31 NOTE — ED Notes (Addendum)
 Writer called out to front of WR by first nurse due to pt with AMS and difficult to get out of vehicle. Pt sitting in passenger side front seat diaphoretic, lethargic, in and out of consciousness. Pt confused and when attempting to get him to exit car, pt refusing and slurring speech when responding. Per driver, pt is a diabetic and they have been out drinking alcohol. Driver noticed that pt was becoming agitated, sweating, and AMS. Orange juice provided to pt while staff continuing to get pt to agree to get out of car. CBG obtained with override function with result of 12. Pt continuing to decline and Clinical research associate with staff help assisted pt out of car and onto ED stretcher, Pt taken to room 10, 18g IV started in pts left AC with 2 AMPS of D50 given per verbal EDP. Pt with insulin  pump noted attached to lower right abdomen and removed by Clinical research associate. Per device assessment, last insulin  bolus was 0118, 10units.   Pt coming to post D50 and sts, I just want to die. No body cares. Pt continues to make suicidal comments and attempting to leave. Medication verbal order by EDP @ bedside to assist in calming pt due to his increase in agitation.

## 2023-10-31 NOTE — H&P (Signed)
 History and Physical    Ronald Banks FMW:[id number] DOB: [birth year] DOA: 10/31/2023  DOS: the patient was seen and examined on 10/31/2023  PCP: Vicci Barnie NOVAK, MD   Patient coming from: Home  I have personally briefly reviewed patient's old medical records in [facility]  Chief Complaint: Acute change in mental status  HPI: Ronald Banks is a pleasant 48 y.o. male with medical history significant for type 1 diabetes on insulin  pump, HTN, HLD, hypothyroidism, depression, tobacco dependence, polysubstance abuse who presented to ED today via private vehicle for acute change in mental status/unresponsiveness in the vehicle.  Patient was driven by a friend who was with the patient throughout the night.  Patient was  in his normal state until last night.  Patient and his friend were drinking out and did not feel well and wanted to be taken home.  While the friend was driving him home he became unresponsive and the friend drove him to the emergency room at [facility].  Patient is not able to provide meaningful history.  He is sleepy, follows minor, and but not able to provide history.  ED Course: Upon arrival to the ED, patient is found to be hypoglycemic with blood glucose at 35, potassium was 3.  Patient was given D50 x 2 and was placed on D10 due to persistent hypoglycemia in the emergency room.  Patient was positive in his urine with amphetamine, Cocaine and cannabinoids.  His urine analysis did not show any infection.  There was no leukocytosis or AKI.  Hospitalist service was consulted for evaluation of his acute change in mental status, hypoglycemia and EtOH/polysubstance abuse.  Review of Systems:  ROS  All other systems negative except as noted in the HPI.  Past Medical History:  Diagnosis Date   ADHD (attention deficit hyperactivity disorder), inattentive type    Chronic midline low back pain with right-sided sciatica 02/11/2018   Chronic right shoulder pain 02/11/2018   DKA  (diabetic ketoacidosis) 08/17/2020   DOE (dyspnea on exertion) 05/19/2019   Onset 2015/16 05/19/2019   Walked RA x two laps =  approx 540ft @ brisk pace - stopped due to end of study with sats of 98 % at the end of the study and c/o sob s cp  > rec CPST next  - alpha one AT  Screen   05/19/19 MM  152  - CPST  06/30/19  FEV1 3.79 (93%) with ratio 0.73 and nl f/v loop: ex study  probabably  wnl when wt factored  In  - sob with ventilatory reserve suggested sub max effort and de   Hyperlipidemia due to type 1 diabetes mellitus 04/18/2018   Hypertension associated with diabetes 04/18/2018   Trial off acei 04/07/2019 due to cough > improved by 07/03/2019 with just a typical smoker's rattle (no longer cough to point of gag/vomit)   Hypothyroidism    Major depressive disorder 10/09/2019   Moderate nonproliferative diabetic retinopathy of both eyes (HCC) 05/19/2019   Tobacco dependence 10/09/2019   Type 1 diabetes mellitus with diabetic polyneuropathy 04/18/2018    Past Surgical History:  Procedure Laterality Date   DENTAL SURGERY     EAR CYST EXCISION Right 03/24/2020   Procedure: EXCISION PRE-AURICULAR CYST;  Surgeon: Milissa Hamming, MD;  Location: [facility];  Service: ENT;  Laterality: Right;  diabetic insulin  pump   PREAURICULAR CYST EXCISION Left 07/07/2020   Procedure: EXCISION PREAURICULAR CYST ADULT;  Surgeon: Milissa Hamming, MD;  Location: [facility];  Service: ENT;  Laterality: Left;     reports that he has been smoking cigarettes. He has a 45 pack-year smoking history. He has never used smokeless tobacco. He reports that he does not currently use alcohol. He reports current drug use. Drug: Marijuana.  No Known Allergies  Family History  Problem Relation Age of Onset   Diabetes Mother    Cancer Mother        Brain, breast    Diabetes Sister    Diabetes Maternal Grandmother     Prior to Admission medications   Medication Sig Start Date End Date Taking? Authorizing  Provider  atorvastatin  (LIPITOR) 10 MG tablet Take 10 mg by mouth daily. 03/01/21  Yes [provider]  DULoxetine  (CYMBALTA ) 60 MG capsule Take 60 mg by mouth daily. 10/25/23  Yes [provider]  insulin  aspart (NOVOLOG ) 100 UNIT/ML injection Up to 100 units daily in pump 08/12/20  Yes [provider]  levothyroxine  (SYNTHROID ) 125 MCG tablet Take 2 tablets by mouth daily. 02/09/21  Yes [provider]  cyclobenzaprine  (FLEXERIL ) 10 MG tablet Take 1 tablet (10 mg total) by mouth 3 (three) times daily as needed for muscle spasms. Patient not taking: Reported on 10/31/2023 05/02/23   Fleming, Zelda W, NP  diclofenac  Sodium (VOLTAREN ) 1 % GEL Apply 2 g topically 4 (four) times daily. Patient not taking: Reported on 10/31/2023 05/02/23   Fleming, Zelda W, NP  fluticasone  (FLONASE ) 50 MCG/ACT nasal spray Place 2 sprays into both nostrils daily. Patient not taking: Reported on 05/02/2023 07/30/19   Danton Jon HERO, PA-C  gabapentin  (NEURONTIN ) 600 MG tablet Take 1 tablet (600 mg total) by mouth 3 (three) times daily. Patient not taking: Reported on 10/31/2023 05/02/23   Fleming, Zelda W, NP  Insulin  Human (INSULIN  PUMP) SOLN Inject into the skin.    [provider]  lidocaine  (LIDODERM ) 5 % Place 1 patch onto the skin daily. Remove & Discard patch within 12 hours or as directed by MD Patient not taking: Reported on 10/31/2023 05/02/23   Fleming, Zelda W, NP  meloxicam  (MOBIC ) 15 MG tablet Take 1 tablet (15 mg total) by mouth daily. Patient not taking: Reported on 10/31/2023 05/02/23   [name] ORN, NP    Physical Exam: Vitals:   10/31/23 0800 10/31/23 0815 10/31/23 0842 10/31/23 0900  BP: 99/71 103/66 (!) 129/98 115/68  Pulse: 64 67 73 62  Resp: 12 13 (!) 22 15  Temp: (!) 96.9 F (36.1 C) (!) 97.2 F (36.2 C) 98.4 F (36.9 C)   TempSrc:   Oral   SpO2: 99% 97% 95% 99%  Weight:   85.3 kg   Height:   5' 10 (1.778 m)     Physical Exam   Constitutional: Alert,  sleepy, arousable HEENT: Neck supple Respiratory: Clear to auscultation B/L, no wheezing, no rales.  Cardiovascular: Regular rate and rhythm, no murmurs / rubs / gallops. No extremity edema. 2+ pedal pulses. No carotid bruits.  Abdomen: Soft, no tenderness, Bowel sounds positive.  Musculoskeletal: no clubbing / cyanosis. Good ROM, no contractures. Normal muscle tone.  Skin: no rashes, lesions, ulcers. Neurologic: CN 2-12 grossly intact. Sensation intact, No focal deficit identified Psychiatric: Sleepy, follows minor commands  Labs on Admission: I have personally reviewed following labs and imaging studies  CBC: Recent Labs  Lab 10/31/23 0402  WBC 6.5  NEUTROABS 4.3  HGB 13.3  HCT 40.1  MCV 94.4  PLT 252   Basic Metabolic Panel: Recent Labs  Lab 10/31/23 0402  NA 140  K 3.0*  CL 105  CO2 24  GLUCOSE 160*  BUN 8  CREATININE 0.99  CALCIUM  8.7*   GFR: Estimated Creatinine Clearance: 92.2 mL/min (by C-G formula based on SCr of 0.99 mg/dL). Liver Function Tests: No results for input(s): AST, ALT, ALKPHOS, BILITOT, PROT, ALBUMIN in the last 168 hours. No results for input(s): LIPASE, AMYLASE in the last 168 hours. No results for input(s): AMMONIA in the last 168 hours. Coagulation Profile: No results for input(s): INR, PROTIME in the last 168 hours. Cardiac Enzymes: No results for input(s): CKTOTAL, CKMB, CKMBINDEX, TROPONINI, TROPONINIHS in the last 168 hours. BNP (last 3 results) No results for input(s): BNP in the last 8760 hours. HbA1C: No results for input(s): HGBA1C in the last 72 hours. CBG: Recent Labs  Lab 10/31/23 0539 10/31/23 0602 10/31/23 0629 10/31/23 0738 10/31/23 0840  GLUCAP 153* 124* 124* 162* 215*   Lipid Profile: No results for input(s): CHOL, HDL, LDLCALC, TRIG, CHOLHDL, LDLDIRECT in the last 72 hours. Thyroid  Function Tests: Recent Labs    10/31/23 0402  TSH 61.000*   Anemia Panel: No  results for input(s): VITAMINB12, FOLATE, FERRITIN, TIBC, IRON, RETICCTPCT in the last 72 hours. Urine analysis:    Component Value Date/Time   COLORURINE YELLOW (A) 10/31/2023 0645   APPEARANCEUR CLEAR (A) 10/31/2023 0645   LABSPEC 1.005 10/31/2023 0645   PHURINE 5.0 10/31/2023 0645   GLUCOSEU >=500 (A) 10/31/2023 0645   HGBUR NEGATIVE 10/31/2023 0645   BILIRUBINUR NEGATIVE 10/31/2023 0645   BILIRUBINUR negative 02/11/2018 1135   KETONESUR NEGATIVE 10/31/2023 0645   PROTEINUR NEGATIVE 10/31/2023 0645   UROBILINOGEN 1.0 02/11/2018 1135   NITRITE NEGATIVE 10/31/2023 0645   LEUKOCYTESUR NEGATIVE 10/31/2023 0645    Radiological Exams on Admission: I have personally reviewed images DG Chest Portable 1 View Result Date: 10/31/2023 CLINICAL DATA:  Altered mental status unresponsive EXAM: PORTABLE CHEST 1 VIEW COMPARISON:  11/25/2022 FINDINGS: The heart size and mediastinal contours are within normal limits. Both lungs are clear. The visualized skeletal structures are unremarkable. IMPRESSION: No acute abnormality of the lungs in AP portable projection. Electronically Signed   By: Marolyn JONETTA Jaksch M.D.   On: 10/31/2023 05:37    EKG: My personal interpretation of EKG shows: Sinus rhythm at 68 bpm, no ST elevation    Assessment/Plan Principal Problem:   Hypoglycemia Active Problems:   Hypothyroidism   Essential hypertension   Tobacco dependence   Type 1 diabetes mellitus with diabetic polyneuropathy   Polysubstance abuse (HCC)   ETOH abuse    Assessment and Plan: 48 year old male with history of HTN, DM type I, hypothyroidism, tobacco dependence, EtOH use, polysubstance abuse who who was brought in by friend in a private vehicle after he was unconscious in the car.  1.  Acute change in mental status/persistent hypoglycemia - Patient will be admitted to stepdown unit - Patient was on D10. - Once patient mentation improves he will be able to eat, drink and he will be  placed on Lantus and insulin  sliding scale. - Will adjust his insulin . - Will continue to give him IV fluid. - There is no obvious signs of sepsis or infection with negative urine analysis and negative chest x-ray and no leukocytosis.  2.  Polysubstance abuse/EtOH abuse/smoking - Patient is positive for cocaine, methamphetamine and cannabinoids. - His alcohol level is 112. - Will place him on CIWA protocol - Monitor him in telemetry in the stepdown unit.  3.  Hypothyroidism -  Patient appears to be not taking medications. - He will be placed on his home dose of levothyroxine . - Continue to monitor his symptoms  4.  Depression - There was concern for possible suicidal ideation/intent - Patient stated that he did not want to get better per emergency room provider. - There was concern for insulin  overdose - I am not sure whether he was taking his Cymbalta , gabapentin  at home or not. - Will continue to monitor and hold off medication as there was concern for not taking before. - He will be seen by psychiatry when his mentation improves.  5.  Hypokalemia - Potassium will be replaced - Will check potassium level  6.  Hyperlipidemia - Will hold off Lipitor at this point. - We can resume at primary discharge     DVT prophylaxis: Lovenox  Code Status: Full Code Family Communication: None  Disposition Plan: Home  Consults called: None  Admission status: Observation, Step Down Unit   Nena Rebel, MD [facility] 10/31/2023, 9:49 AM

## 2023-11-01 DIAGNOSIS — I10 Essential (primary) hypertension: Secondary | ICD-10-CM

## 2023-11-01 DIAGNOSIS — E1042 Type 1 diabetes mellitus with diabetic polyneuropathy: Secondary | ICD-10-CM

## 2023-11-01 DIAGNOSIS — F1010 Alcohol abuse, uncomplicated: Secondary | ICD-10-CM | POA: Diagnosis not present

## 2023-11-01 DIAGNOSIS — F1910 Other psychoactive substance abuse, uncomplicated: Secondary | ICD-10-CM

## 2023-11-01 DIAGNOSIS — E039 Hypothyroidism, unspecified: Secondary | ICD-10-CM

## 2023-11-01 DIAGNOSIS — T1491XA Suicide attempt, initial encounter: Secondary | ICD-10-CM | POA: Diagnosis not present

## 2023-11-01 DIAGNOSIS — E162 Hypoglycemia, unspecified: Secondary | ICD-10-CM | POA: Diagnosis not present

## 2023-11-01 DIAGNOSIS — F17200 Nicotine dependence, unspecified, uncomplicated: Secondary | ICD-10-CM

## 2023-11-01 LAB — COMPREHENSIVE METABOLIC PANEL WITH GFR
ALT: 9 U/L (ref 0–44)
AST: 14 U/L — ABNORMAL LOW (ref 15–41)
Albumin: 2.7 g/dL — ABNORMAL LOW (ref 3.5–5.0)
Alkaline Phosphatase: 36 U/L — ABNORMAL LOW (ref 38–126)
Anion gap: 3 — ABNORMAL LOW (ref 5–15)
BUN: 11 mg/dL (ref 6–20)
CO2: 24 mmol/L (ref 22–32)
Calcium: 7.7 mg/dL — ABNORMAL LOW (ref 8.9–10.3)
Chloride: 109 mmol/L (ref 98–111)
Creatinine, Ser: 1.13 mg/dL (ref 0.61–1.24)
GFR, Estimated: >60 mL/min (ref >60)
Glucose, Bld: 364 mg/dL — ABNORMAL HIGH (ref 70–99)
Potassium: 4.1 mmol/L (ref 3.5–5.1)
Sodium: 136 mmol/L (ref 135–145)
Total Bilirubin: 0.9 mg/dL (ref 0.0–1.2)
Total Protein: 4.8 g/dL — ABNORMAL LOW (ref 6.5–8.1)

## 2023-11-01 LAB — CBC
HCT: 38.8 % — ABNORMAL LOW (ref 39.0–52.0)
Hemoglobin: 12.6 g/dL — ABNORMAL LOW (ref 13.0–17.0)
MCH: 30.8 pg (ref 26.0–34.0)
MCHC: 32.5 g/dL (ref 30.0–36.0)
MCV: 94.9 fL (ref 80.0–100.0)
Platelets: 230 K/uL (ref 150–400)
RBC: 4.09 MIL/uL — ABNORMAL LOW (ref 4.22–5.81)
RDW: 12.9 % (ref 11.5–15.5)
WBC: 7.3 K/uL (ref 4.0–10.5)
nRBC: 0 % (ref 0.0–0.2)

## 2023-11-01 LAB — MISC LABCORP TEST (SEND OUT): Labcorp test code: 83935

## 2023-11-01 LAB — GLUCOSE, CAPILLARY
Glucose-Capillary: 378 mg/dL — ABNORMAL HIGH (ref 70–99)
Glucose-Capillary: 411 mg/dL — ABNORMAL HIGH (ref 70–99)
Glucose-Capillary: 344 mg/dL — ABNORMAL HIGH (ref 70–99)
Glucose-Capillary: 124 mg/dL — ABNORMAL HIGH (ref 70–99)
Glucose-Capillary: 112 mg/dL — ABNORMAL HIGH (ref 70–99)

## 2023-11-01 LAB — MAGNESIUM: Magnesium: 2 mg/dL (ref 1.7–2.4)

## 2023-11-01 LAB — PROTIME-INR
INR: 1.1 (ref 0.8–1.2)
Prothrombin Time: 15.3 s — ABNORMAL HIGH (ref 11.4–15.2)

## 2023-11-01 MED ORDER — INSULIN GLARGINE 100 UNIT/ML ~~LOC~~ SOLN
30.0000 [IU] | Freq: Every day | SUBCUTANEOUS | Status: DC
  Administered 2023-11-01: 30 [IU] via SUBCUTANEOUS
  Filled 2023-11-01 (×2): qty 0.3

## 2023-11-01 MED ORDER — INSULIN ASPART 100 UNIT/ML IJ SOLN
0.0000 [IU] | Freq: Every day | INTRAMUSCULAR | Status: DC
  Administered 2023-11-04: 3 [IU] via SUBCUTANEOUS
  Administered 2023-11-05: 2 [IU] via SUBCUTANEOUS
  Filled 2023-11-01 (×2): qty 1

## 2023-11-01 MED ORDER — ATORVASTATIN CALCIUM 10 MG PO TABS
10.0000 mg | ORAL_TABLET | Freq: Every day | ORAL | Status: DC
  Administered 2023-11-01 – 2023-11-05 (×5): 10 mg via ORAL
  Filled 2023-11-01 (×5): qty 1

## 2023-11-01 MED ORDER — INSULIN ASPART 100 UNIT/ML IJ SOLN
3.0000 [IU] | Freq: Three times a day (TID) | INTRAMUSCULAR | Status: DC
  Administered 2023-11-01 – 2023-11-04 (×6): 3 [IU] via SUBCUTANEOUS
  Filled 2023-11-01 (×6): qty 1

## 2023-11-01 MED ORDER — PANTOPRAZOLE SODIUM 40 MG PO TBEC
40.0000 mg | DELAYED_RELEASE_TABLET | Freq: Every day | ORAL | Status: DC
  Administered 2023-11-01 – 2023-11-06 (×6): 40 mg via ORAL
  Filled 2023-11-01 (×5): qty 1

## 2023-11-01 MED ORDER — DULOXETINE HCL 30 MG PO CPEP
60.0000 mg | ORAL_CAPSULE | Freq: Every day | ORAL | Status: DC
  Administered 2023-11-01 – 2023-11-06 (×6): 60 mg via ORAL
  Filled 2023-11-01 (×6): qty 2

## 2023-11-01 MED ORDER — INSULIN ASPART 100 UNIT/ML IJ SOLN
0.0000 [IU] | Freq: Three times a day (TID) | INTRAMUSCULAR | Status: DC
  Administered 2023-11-01: 9 [IU] via SUBCUTANEOUS
  Administered 2023-11-01: 1 [IU] via SUBCUTANEOUS
  Administered 2023-11-02 (×2): 2 [IU] via SUBCUTANEOUS
  Administered 2023-11-03: 3 [IU] via SUBCUTANEOUS
  Administered 2023-11-03: 2 [IU] via SUBCUTANEOUS
  Administered 2023-11-04: 3 [IU] via SUBCUTANEOUS
  Administered 2023-11-05: 9 [IU] via SUBCUTANEOUS
  Administered 2023-11-05: 5 [IU] via SUBCUTANEOUS
  Administered 2023-11-05: 2 [IU] via SUBCUTANEOUS
  Administered 2023-11-06: 3 [IU] via SUBCUTANEOUS
  Administered 2023-11-06: 7 [IU] via SUBCUTANEOUS
  Administered 2023-11-06: 5 [IU] via SUBCUTANEOUS
  Filled 2023-11-01 (×11): qty 1

## 2023-11-01 NOTE — Plan of Care (Signed)

## 2023-11-01 NOTE — Hospital Course (Addendum)
 Partly taken from H&P.  Ronald Banks is a pleasant 48 y.o. male with medical history significant for type 1 diabetes on insulin  pump, HTN, HLD, hypothyroidism, depression, tobacco dependence, polysubstance abuse who presented to ED today via private vehicle for acute change in mental status/unresponsiveness in the vehicle.  Patient was driven by a friend who was with the patient throughout the night.  Patient was  in his normal state until last night.  Patient and his friend were drinking out and did not feel well and wanted to be taken home.  While the friend was driving him home he became unresponsive and the friend drove him to the emergency room at [facility].   On presentation patient was hypoglycemic with blood glucose of 35, potassium of 3-received D50 x 2 and was placed on D10 due to persistent hypoglycemia. UDS positive for amphetamine, cocaine and cannabinoid.  No leukocytosis or AKI.  Ethanol level at 112.  Patient later endorsed intentionally taking excessive insulin  to kill himself, psych was consulted and patient was [facility].  They are recommending transfer to behavioral health once medically stable.  10/9: Vital stable, blood glucose significantly elevated. We increased the dose of Lantus to 30 units, made sliding scale sensitive and added 3 units with meal. A1c of 9.8.  Patient becoming agitated and threatening to talk with his lawyer if we do not let him get out of the hospital.  Patient does not want me to talk with any family member.  10/10: Remained hemodynamically stable, had an episode of mild hypoglycemia this morning, Lantus was decreased to 24 units.  Medically stable to go to behavioral health but unfortunately no bed available today.  Diabetes coordinator can follow at behavioral health, they just need to do another consult order once there.

## 2023-11-01 NOTE — Assessment & Plan Note (Signed)
 Per chart review patient admitted in front of the EDP and psych that he took extra insulin  with intention to hurt himself.  With me he was denying and becoming very aggressive.  Psych evaluated him and patient need to go to behavioral health once medically stable from a blood sugar standpoint.

## 2023-11-01 NOTE — Assessment & Plan Note (Signed)
Blood pressure within goal. Not on any antihypertensives at home. -Continue to monitor

## 2023-11-01 NOTE — Inpatient Diabetes Management (Signed)
 Inpatient Diabetes Program Recommendations  AACE/ADA: New Consensus Statement on Inpatient Glycemic Control   Target Ranges:  Prepandial:   less than 140 mg/dL      Peak postprandial:   less than 180 mg/dL (1-2 hours)      Critically ill patients:  140 - 180 mg/dL    Latest Reference Range & Units 10/31/23 04:02 11/01/23 05:15  Sodium 135 - 145 mmol/L 140 136  Potassium 3.5 - 5.1 mmol/L 3.0 (L) 4.1  Chloride 98 - 111 mmol/L 105 109  CO2 22 - 32 mmol/L 24 24  Glucose 70 - 99 mg/dL 839 (H) 635 (H)  Mean Plasma Glucose mg/dL    BUN 6 - 20 mg/dL 8 11  Creatinine 9.38 - 1.24 mg/dL 9.00 8.86  Calcium  8.9 - 10.3 mg/dL 8.7 (L) 7.7 (L)  Anion gap 5 - 15  11 3  (L)    Latest Reference Range & Units 10/31/23 04:02 10/31/23 09:51  Lactic Acid, Venous 0.5 - 1.9 mmol/L 2.8 (HH) 2.0 (HH)    Latest Reference Range & Units 10/31/23 07:38 10/31/23 08:40 10/31/23 12:47 10/31/23 16:26 11/01/23 07:42  Glucose-Capillary 70 - 99 mg/dL 837 (H) 784 (H) 821 (H) 424 (H) 378 (H)    Latest Reference Range & Units 10/31/23 09:51  Hemoglobin A1C 4.8 - 5.6 % 9.8 (H)   Review of Glycemic Control Diabetes history: DM1   Outpatient Diabetes medications:   Per office visit note on 10/25/23 with Dr Cherilyn -    NovoLog  in the T slim control IQ pump:  Basal rates Midnight = 1.4 4 AM = 1.5 8 AM = 1.35 1 PM = 1.45 TDD basal: 34.3 units  Bolus settings I/C: 8 ISF: 40 Target Glucose: 110 Active insulin  time: 5 hours  Total Daily dose approximately 40 units. 84% is basal.    Note: Patient recently seen by Dr Cherilyn on 10/25/23; however, prior to this visit, patient had not seen provider since 04/27/22.    Current orders for Inpatient glycemic control:  Lantus 20 units daily  Novolog  0-6 units TID   Inpatient Diabetes Program Recommendations:   Please consider the following:    - Increasing Lantus to 30 units daily  - Change Novolog  correction to 0-9 units q4hrs.   - Add Novolog  3 unitd TID  with meals (if patient  consumes at least 50% of meal).   Thanks,  Lavanda Search, RN, MSN, [facility]  Inpatient Diabetes Coordinator  Pager [phone] (8a-5p)

## 2023-11-01 NOTE — Assessment & Plan Note (Addendum)
 Patient uses insulin  pump at baseline. CBG elevated, he did receive D10 due to persistent hypoglycemia on admission, which has been discontinued. Seems uncontrolled with hyperglycemia and A1c of 9.8 - Started on Lantus 30 units daily -Sensitive SSI -3 units with meal -Continue to monitor

## 2023-11-01 NOTE — Assessment & Plan Note (Signed)
 Acute metabolic encephalopathy secondary to hypoglycemia. Mentation has been improved with correction of blood glucose level.  Per chart review patient intentionally took extra insulin  to hurt himself so psych is involved.  With me he declined and was threatening to sue us  if we do not let him go.

## 2023-11-01 NOTE — Assessment & Plan Note (Signed)
 Significantly elevated TSH at 61, patient was not taking his home Synthroid . He was mostly noncompliant at baseline -Restarted home dose of Synthroid  -Needed close monitoring by PCP

## 2023-11-01 NOTE — Assessment & Plan Note (Signed)
 Nicotine patch as needed

## 2023-11-01 NOTE — Progress Notes (Signed)
 Progress Note   Patient: Ronald Banks FMW:[id number] DOB: 12-Jun-1975 DOA: 10/31/2023     1 DOS: the patient was seen and examined on 11/01/2023   Brief hospital course: Partly taken from H&P.  Ronald Banks is a pleasant 48 y.o. male with medical history significant for type 1 diabetes on insulin  pump, HTN, HLD, hypothyroidism, depression, tobacco dependence, polysubstance abuse who presented to ED today via private vehicle for acute change in mental status/unresponsiveness in the vehicle.  Patient was driven by a friend who was with the patient throughout the night.  Patient was  in his normal state until last night.  Patient and his friend were drinking out and did not feel well and wanted to be taken home.  While the friend was driving him home he became unresponsive and the friend drove him to the emergency room at [facility].   On presentation patient was hypoglycemic with blood glucose of 35, potassium of 3-received D50 x 2 and was placed on D10 due to persistent hypoglycemia. UDS positive for amphetamine, cocaine and cannabinoid.  No leukocytosis or AKI.  Ethanol level at 112.  Patient later endorsed intentionally taking excessive insulin  to kill himself, psych was consulted and patient was [facility].  They are recommending transfer to behavioral health once medically stable.  10/9: Vital stable, blood glucose significantly elevated. We increased the dose of Lantus to 30 units, made sliding scale sensitive and added 3 units with meal. A1c of 9.8.  Patient becoming agitated and threatening to talk with his lawyer if we do not let him get out of the hospital.  Patient does not want me to talk with any family member.  Assessment and Plan: * Hypoglycemia Acute metabolic encephalopathy secondary to hypoglycemia. Mentation has been improved with correction of blood glucose level.  Per chart review patient intentionally took extra insulin  to hurt himself so psych is involved.  With me he declined  and was threatening to sue us  if we do not let him go.  ETOH abuse Alcohol level on admission was 112. -Continue with CIWA protocol  Polysubstance abuse (HCC) UDS positive for cocaine,methamphetamine And cannabinoid. -Patient was very agitated to consult today. - Seems like underlying psych issues and he was threatening to sue us  and accusing that we have stolen his property  Suicide attempt [facility]) Per chart review patient admitted in front of the EDP and psych that he took extra insulin  with intention to hurt himself.  With me he was denying and becoming very aggressive.  Psych evaluated him and patient need to go to behavioral health once medically stable from a blood sugar standpoint.  Type 1 diabetes mellitus with diabetic polyneuropathy Patient uses insulin  pump at baseline. CBG elevated, he did receive D10 due to persistent hypoglycemia on admission, which has been discontinued. Seems uncontrolled with hyperglycemia and A1c of 9.8 - Started on Lantus 30 units daily -Sensitive SSI -3 units with meal -Continue to monitor  Essential hypertension Blood pressure within goal.  Not on any antihypertensives at home. - Continue to monitor  Hypothyroidism Significantly elevated TSH at 61, patient was not taking his home Synthroid . He was mostly noncompliant at baseline -Restarted home dose of Synthroid  -Needed close monitoring by PCP  Tobacco dependence - Nicotine  patch as needed   Subjective: Patient was seen and examined today.  A sitter was at bedside.  Patient was very agitated and keep accusing that we have stolen his phone and other belongings.  He does not want me to involve  any family member and keeps saying that he wants his phone so he can call his lawyer.  Physical Exam: Vitals:   10/31/23 2156 11/01/23 0319 11/01/23 0413 11/01/23 0745  BP: 111/65 (!) 112/52 (!) 94/56 131/74  Pulse: (!) 55 (!) 52 79 (!) 58  Resp: 15 16 17 16   Temp: 98.5 F (36.9 C) (!) 97.5 F  (36.4 C) 98.1 F (36.7 C) 98.5 F (36.9 C)  TempSrc: Oral     SpO2: 100% 97% 90% 97%  Weight:      Height:       General.  Well-developed, agitated gentleman, in no acute distress. Pulmonary.  Lungs clear bilaterally, normal respiratory effort. CV.  Regular rate and rhythm, no JVD, rub or murmur. Abdomen.  Soft, nontender, nondistended, BS positive. CNS.  Alert and oriented .  No focal neurologic deficit. Extremities.  No edema, no cyanosis, pulses intact and symmetrical. Psychiatry.  Appears agitated  Data Reviewed: Prior data reviewed  Family Communication: Patient does not want to communicate with any family member  Disposition: Status is: Inpatient Remains inpatient appropriate because: Severity of illness  Planned Discharge Destination: Behavioral health  DVT prophylaxis.  Lovenox  Time spent: 50 minutes  This record has been created using Conservation officer, historic buildings. Errors have been sought and corrected,but may not always be located. Such creation errors do not reflect on the standard of care.   Author: Amaryllis Dare, MD 11/01/2023 12:42 PM  For on call review [email or web address].

## 2023-11-01 NOTE — Assessment & Plan Note (Signed)
 Alcohol level on admission was 112. -Continue with CIWA protocol

## 2023-11-01 NOTE — Plan of Care (Signed)

## 2023-11-01 NOTE — Assessment & Plan Note (Addendum)
 UDS positive for cocaine,methamphetamine And cannabinoid. -Patient was very agitated to consult today. - Seems like underlying psych issues and he was threatening to sue us  and accusing that we have stolen his property

## 2023-11-02 DIAGNOSIS — E039 Hypothyroidism, unspecified: Secondary | ICD-10-CM | POA: Diagnosis not present

## 2023-11-02 DIAGNOSIS — T1491XA Suicide attempt, initial encounter: Secondary | ICD-10-CM | POA: Diagnosis not present

## 2023-11-02 DIAGNOSIS — I10 Essential (primary) hypertension: Secondary | ICD-10-CM | POA: Diagnosis not present

## 2023-11-02 DIAGNOSIS — E162 Hypoglycemia, unspecified: Secondary | ICD-10-CM | POA: Diagnosis not present

## 2023-11-02 DIAGNOSIS — R45851 Suicidal ideations: Secondary | ICD-10-CM

## 2023-11-02 LAB — GLUCOSE, CAPILLARY
Glucose-Capillary: 46 mg/dL — ABNORMAL LOW (ref 70–99)
Glucose-Capillary: 58 mg/dL — ABNORMAL LOW (ref 70–99)
Glucose-Capillary: 94 mg/dL (ref 70–99)
Glucose-Capillary: 188 mg/dL — ABNORMAL HIGH (ref 70–99)
Glucose-Capillary: 172 mg/dL — ABNORMAL HIGH (ref 70–99)
Glucose-Capillary: 165 mg/dL — ABNORMAL HIGH (ref 70–99)

## 2023-11-02 MED ORDER — INSULIN GLARGINE 100 UNIT/ML ~~LOC~~ SOLN
24.0000 [IU] | Freq: Every day | SUBCUTANEOUS | Status: DC
Start: 2023-11-02 — End: 2023-11-04
  Administered 2023-11-02 – 2023-11-03 (×2): 24 [IU] via SUBCUTANEOUS
  Filled 2023-11-02 (×3): qty 0.24

## 2023-11-02 NOTE — Plan of Care (Signed)
  Problem: Health Behavior/Discharge Planning: Goal: Ability to manage health-related needs will improve Outcome: Progressing   Problem: Clinical Measurements: Goal: Ability to maintain clinical measurements within normal limits will improve Outcome: Progressing Goal: Will remain free from infection Outcome: Progressing Goal: Respiratory complications will improve Outcome: Progressing   Problem: Nutrition: Goal: Adequate nutrition will be maintained Outcome: Progressing   Problem: Coping: Goal: Level of anxiety will decrease Outcome: Progressing

## 2023-11-02 NOTE — Plan of Care (Signed)

## 2023-11-02 NOTE — Inpatient Diabetes Management (Signed)
 Inpatient Diabetes Program Recommendations  AACE/ADA: New Consensus Statement on Inpatient Glycemic Control  Target Ranges:  Prepandial:   less than 140 mg/dL      Peak postprandial:   less than 180 mg/dL (1-2 hours)      Critically ill patients:  140 - 180 mg/dL    Latest Reference Range & Units 11/02/23 08:08 11/02/23 08:26  Glucose-Capillary 70 - 99 mg/dL 46 (L) 58 (L)    Latest Reference Range & Units 11/01/23 07:42 11/01/23 11:15 11/01/23 14:01 11/01/23 16:53 11/01/23 20:42  Glucose-Capillary 70 - 99 mg/dL 621 (H) 588 (H) 655 (H) 124 (H) 112 (H)   Review of Glycemic Control  Diabetes history: DM1 Outpatient Diabetes medications: T-Slim insulin  pump with Novolog  (total basal 34.3 units/24 hours; I:CR 1:8 grams; I:SF 1:40 mg/dl), Dexcom G7 Current orders for Inpatient glycemic control: Lantus 30 units daily, Novolog  0-9 units TID with meals, Novlog 0-5 units at bedtime, Novolog  3 units TID with meals  Inpatient Diabetes Program Recommendations:    Insulin : CBG 46 mg/dl at 1:91 am today. Patient has DM1 and requires basal insulin . Please consider decreasing Lantus to 24 units daily.  Thanks, Earnie Gainer, RN, MSN, [facility] Diabetes Coordinator Inpatient Diabetes Program [phone] (Team Pager from 8am to 5pm)

## 2023-11-02 NOTE — Assessment & Plan Note (Signed)
 Acute metabolic encephalopathy secondary to hypoglycemia. Mentation has been improved with correction of blood glucose level.  Per chart review patient intentionally took extra insulin  to hurt himself so psych is involved.

## 2023-11-02 NOTE — Progress Notes (Signed)
 Progress Note   Patient: Ronald [facility] FMW:[id number] DOB: May 22, 1975 DOA: 10/31/2023     2 DOS: the patient was seen and examined on 11/02/2023   Brief hospital course: Partly taken from H&P.  Ronald Banks is a pleasant 48 y.o. male with medical history significant for type 1 diabetes on insulin  pump, HTN, HLD, hypothyroidism, depression, tobacco dependence, polysubstance abuse who presented to ED today via private vehicle for acute change in mental status/unresponsiveness in the vehicle.  Patient was driven by a friend who was with the patient throughout the night.  Patient was  in his normal state until last night.  Patient and his friend were drinking out and did not feel well and wanted to be taken home.  While the friend was driving him home he became unresponsive and the friend drove him to the emergency room at [facility].   On presentation patient was hypoglycemic with blood glucose of 35, potassium of 3-received D50 x 2 and was placed on D10 due to persistent hypoglycemia. UDS positive for amphetamine, cocaine and cannabinoid.  No leukocytosis or AKI.  Ethanol level at 112.  Patient later endorsed intentionally taking excessive insulin  to kill himself, psych was consulted and patient was [facility].  They are recommending transfer to behavioral health once medically stable.  10/9: Vital stable, blood glucose significantly elevated. We increased the dose of Lantus to 30 units, made sliding scale sensitive and added 3 units with meal. A1c of 9.8.  Patient becoming agitated and threatening to talk with his lawyer if we do not let him get out of the hospital.  Patient does not want me to talk with any family member.  10/10: Remained hemodynamically stable, had an episode of mild hypoglycemia this morning, Lantus was decreased to 24 units.  Medically stable to go to behavioral health but unfortunately no bed available today.  Diabetes coordinator can follow at behavioral health, they just need to do  another consult order once there.  Assessment and Plan: * Hypoglycemia Acute metabolic encephalopathy secondary to hypoglycemia. Mentation has been improved with correction of blood glucose level.  Per chart review patient intentionally took extra insulin  to hurt himself so psych is involved.   ETOH abuse Alcohol level on admission was 112. -Continue with CIWA protocol  Polysubstance abuse (HCC) UDS positive for cocaine,methamphetamine And cannabinoid. -Patient was very agitated to consult today. - Seems like underlying psych issues and he was threatening to sue us  and accusing that we have stolen his property  Suicide attempt [facility]) Per chart review patient admitted in front of the EDP and psych that he took extra insulin  with intention to hurt himself.  With me he was denying and becoming very aggressive.  Psych evaluated him and patient need to go to behavioral health once medically stable from a blood sugar standpoint.  Type 1 diabetes mellitus with diabetic polyneuropathy Patient uses insulin  pump at baseline. Seems uncontrolled with hyperglycemia and A1c of 9.8 Had an episode of hypoglycemia this morning, remained asymptomatic -Decreasing Lantus to 24 units daily -Sensitive SSI -3 units with meal -Continue to monitor  Essential hypertension Blood pressure within goal.  Not on any antihypertensives at home. - Continue to monitor  Hypothyroidism Significantly elevated TSH at 61, patient was not taking his home Synthroid . He was mostly noncompliant at baseline -Restarted home dose of Synthroid  -Needed close monitoring by PCP  Tobacco dependence - Nicotine  patch as needed   Subjective: Patient was seen and examined today.  Again quite frustrated that  we are keeping him against his will.  Per patient he never said that he had a suicidal attempt.  I told him that I will ask psych to reevaluate.  Physical Exam: Vitals:   11/01/23 1732 11/01/23 2045 11/02/23 0709  11/02/23 1501  BP: 119/74 106/61 113/62 (!) 128/57  Pulse: (!) 52 (!) 54 (!) 51 (!) 53  Resp: 14 18 16 16   Temp: 97.7 F (36.5 C) 98.4 F (36.9 C) 97.9 F (36.6 C) 98 F (36.7 C)  TempSrc: Oral Oral Oral   SpO2: 98% 97% 98% 98%  Weight:      Height:       General.  Well-developed gentleman, in no acute distress. Pulmonary.  Lungs clear bilaterally, normal respiratory effort. CV.  Regular rate and rhythm, no JVD, rub or murmur. Abdomen.  Soft, nontender, nondistended, BS positive. CNS.  Alert and oriented .  No focal neurologic deficit. Extremities.  No edema, no cyanosis, pulses intact and symmetrical.   Data Reviewed: Prior data reviewed  Family Communication: Patient does not want to communicate with any family member  Disposition: Status is: Inpatient Remains inpatient appropriate because: Severity of illness  Planned Discharge Destination: Behavioral health  DVT prophylaxis.  Lovenox  Time spent: 45 minutes  This record has been created using Conservation officer, historic buildings. Errors have been sought and corrected,but may not always be located. Such creation errors do not reflect on the standard of care.   Author: Amaryllis Dare, MD 11/02/2023 4:30 PM  For on call review [email or web address].

## 2023-11-02 NOTE — Assessment & Plan Note (Signed)
 Patient uses insulin  pump at baseline. Seems uncontrolled with hyperglycemia and A1c of 9.8 Had an episode of hypoglycemia this morning, remained asymptomatic -Decreasing Lantus to 24 units daily -Sensitive SSI -3 units with meal -Continue to monitor

## 2023-11-02 NOTE — Consult Note (Signed)
 [place] [facility] Consult Follow-up  Patient Name: .Ronald Banks  MRN: [id number]  DOB: 29-Sep-1975  Consult Order details:  Orders (From admission, onward)     Start     Ordered   10/31/23 1001  IP CONSULT TO PSYCHIATRY       Ordering Provider: Roann Gouty, MD  Provider:  (Not yet assigned)  Question Answer Comment  Location [facility]   Reason for Consult? Depression?  Suicidal intent with insulin       10/31/23 1000             Mode of Visit: In person    Psychiatry Consult Evaluation  Service Date: November 02, 2023 LOS:  LOS: 2 days  Chief Complaint Intentional insulin  overdose  Primary Psychiatric Diagnoses  Suicide attempt   Assessment  Ronald Banks is a 48 y.o. male admitted: Medically  10/9: Patient seen for follow-up today at the request of the primary team.  On interview patient is alert and oriented.  He is denying SI, HI, and AVH.  He is denying that he ever made the comments that he was trying to harm himself by taking insulin  overdose.  Spoke with his mother via the phone with his permission who indicates the patient had been making threats to overdose on insulin  and had also been making threats to kill her and her and her boyfriend.  She reports they have taken out a protective order and he cannot return to the home.  Given the collateral and the initial consult it is appropriate to continue IVC and to seek psychiatric placement at this time as patient continues to be a danger to himself and others.  10/8 :Per MD note Ronald Banks Feeling is a pleasant 48 y.o. male with medical history significant for type 1 diabetes on insulin  pump, HTN, HLD, hypothyroidism, depression, tobacco dependence, polysubstance abuse who presented to ED today via private vehicle for acute change in mental status/unresponsiveness in the vehicle.  Patient was driven by a friend who was with the patient throughout the night.  Patient was  in his normal state  until last night.  Patient and his friend were drinking out and did not feel well and wanted to be taken home.  While the friend was driving him home he became unresponsive and the friend drove him to the emergency room at [facility].  Patient is not able to provide meaningful history.  He is sleepy, follows minor, and but not able to provide history.   On assessment today, patient was noted to be lying in bed.  He was very tearful during interview and could not respond to some of the assessment questions due to emotional status.  He did endorse intentionally taking insulin  prior to his arrival to the emergency department in an attempt to kill self.  He reported he did not tell his friend what he did and that his friend drove him to the emergency department because he became unresponsive.  He endorsed homicidal ideations but would not elaborate on who or if he had a plan.  He did report the homicidal ideations were due to a certain situation.  Patient denied any visual or auditory hallucinations, but when asked about ability to receive special messages from TV or radio's patient stated maybe.  Patient was calm and cooperative throughout this assessment.  Due to the intentional overdose and homicidal ideations, patient is a risk to self and others.  Recommendation to continue IVC and once patient is medically  stable, we recommend inpatient psychiatric admission.  This was discussed with patient who verbalized understanding.  Diagnoses:  Active Hospital problems: Principal Problem:   Hypoglycemia Active Problems:   Hypothyroidism   Essential hypertension   Tobacco dependence   Type 1 diabetes mellitus with diabetic polyneuropathy   Polysubstance abuse (HCC)   ETOH abuse   Suicide attempt [facility])    Plan   ## Psychiatric Medication Recommendations:  -awaiting medical stabilization for medication recommendations  ## Medical Decision Making Capacity: Not specifically addressed in this encounter  ##  Further Work-up:   -- most recent EKG on 10/31/2023/ had QtC of 459 -- Pertinent labwork reviewed earlier this admission includes: CBG, CBC, BMP, ethanol, acetaminophen  level, salicylate level, urine drug screen   ## Disposition:-- We recommend inpatient psychiatric hospitalization after medical hospitalization. Patient has been involuntarily committed on 10/31/2023.   ## Behavioral / Environmental: -Utilize compassion and acknowledge the patient's experiences while setting clear and realistic expectations for care.    ## Safety and Observation Level:  - Based on my clinical evaluation, I estimate the patient to be at high risk of self harm in the current setting. - At this time, we recommend  1:1 Observation. This decision is based on my review of the chart including patient's history and current presentation, interview of the patient, mental status examination, and consideration of suicide risk including evaluating suicidal ideation, plan, intent, suicidal or self-harm behaviors, risk factors, and protective factors. This judgment is based on our ability to directly address suicide risk, implement suicide prevention strategies, and develop a safety plan while the patient is in the clinical setting. Please contact our team if there is a concern that risk level has changed.  CSSR Risk Category:C-SSRS RISK CATEGORY: High Risk  Suicide Risk Assessment: Patient has following modifiable risk factors for suicide: active suicidal ideation, lack of access to outpatient mental health resources, and triggering events, which we are addressing by recommending inpatient admission after medical stabilization. Patient has following non-modifiable or demographic risk factors for suicide: male gender and history of suicide attempt Patient has the following protective factors against suicide: Supportive friends  Thank you for this consult request. Recommendations have been communicated to the primary team.  We  will continue to assess for inpatient psychiatric admission- At this time, it does not appear patient is medically stable enough, physically wise, for our inpatient psychiatric unit at this current time.           History of Present Illness  Relevant Aspects of Ronald Banks   Patient Report:   10/10: On presentation today patient is found lying in bed he is alert and oriented he is pleasant and cooperative.  He denies SI, HI, and AVH.  He minimizes depressive symptoms he minimizes the actions that led him to the hospitalization denies that he ever had a plan or intent to harm himself minimizes previous suicide attempts.  Makes multiple statements that he would never harm himself.  Encouraged me to contact his mother for collateral.  Reports that he lives with his mother.  Reached out to his mother for collateral indicates patient cannot return home as he has made threats to himself, her, and her partner and they have taken out a protective order against him.  She has concerns for both his safety and her safety.  10/8On assessment today, patient was noted to be lying in bed.  He was very tearful during interview and could not respond to some of the assessment questions due  to emotional status.  He did endorse intentionally taking insulin  prior to his arrival to the emergency department in an attempt to kill self.  He reported he did not tell his friend what he did and that his friend drove him to the emergency department because he became unresponsive.  He endorsed homicidal ideations but would not elaborate on who or if he had a plan.  He did report the homicidal ideations were due to a certain situation.  Patient denied any visual or auditory hallucinations, but when asked about ability to receive special messages from TV or radio's patient stated maybe.  Patient was calm and cooperative throughout this assessment.  Due to the intentional overdose and homicidal ideations, patient is a risk to  self and others.  Recommendation to continue IVC and once patient is medically stable, we recommend inpatient psychiatric admission.  This was discussed with patient who verbalized understanding.  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  He reported living at home with his mother.  He reports getting along with his mother sometimes.  He did endorse having pending legal charges and an upcoming court date but did not wish to elaborate further.  When asked about alcohol use, patient reported he did not drink very often.  He reported when he does drink he drinks a couple of beers at a time.  He did endorse occasional THC use and reported he smokes cigarettes daily.  He denied any other illicit drug use.  He endorsed multiple previous attempts at suicide.  He denied any other previous mental health hospitalizations to his knowledge.  He reported taking medications for mental health reasons a long time ago, but could not remember what these medications were.  He also was unsure if he had any previous mental health diagnoses.  He denied any history of violence.  He denied any family history of any mental health conditions or family history of suicide.  Patient was cooperative during assessment, but was noted to get very tearful during some assessment questions.  Patient noted to have depressed affect during assessment as well.  Psych ROS:  Depression: 8/10 Anxiety:  8/10 Mania (lifetime and current): Denied Psychosis: (lifetime and current): Denied      Psychiatric and Social History  Psychiatric History:  Information collected from Patient/chart review  Prev Dx/Sx: Polysubstance abuse, depression Current Psych Provider: None Home Meds (current): None Previous Med Trials: Unsure Therapy: None  Prior Psych Hospitalization: Denied  Prior Self Harm: Yes Prior Violence: Denied  Family Psych History: Denied Family Hx suicide:  Denied  Social History:    Occupational Hx: Unemployed Armed forces operational officer Hx: Upcoming court date Living Situation: Lives with mother  Access to weapons/lethal means: Denied access to firearms   Substance History Alcohol: Occasional   Type of alcohol Beer Last Drink Prior to arrival to ED  History of alcohol withdrawal seizures Denied History of DT's Denied Tobacco: Denied Illicit drugs: Denied- UDS positive for cocaine and amphetamines Prescription drug abuse: Denied Rehab hx: Denied  Exam Findings  Physical Exam: Medical MD note reviewed Vital Signs:  Temp:  [97.7 F (36.5 C)-98.4 F (36.9 C)] 97.9 F (36.6 C) (10/10 0709) Pulse Rate:  [51-54] 51 (10/10 0709) Resp:  [14-18] 16 (10/10 0709) BP: (106-119)/(61-74) 113/62 (10/10 0709) SpO2:  [97 %-98 %] 98 % (10/10 0709) Blood pressure 113/62, pulse (!) 51, temperature 97.9 F (36.6 C), temperature source Oral, resp. rate 16, height 5' 10 (1.778 m), weight 85.3  kg, SpO2 98%. Body mass index is 26.98 kg/m.    Mental Status Exam: General Appearance: Casual  Orientation:  Full (Time, Place, and Person)  Memory:  Immediate;   Fair Recent;   Fair Remote;   Fair  Concentration:  Concentration: Fair and Attention Span: Fair  Recall:  Fair  Attention  Fair  Eye Contact:  Fair  Speech:  Clear and Coherent  Language:  Fair  Volume:  Decreased  Mood: not okay  Affect:  Depressed  Thought Process:  Coherent  Thought Content:  Responded maybe to getting special messages from TV/radios  Suicidal Thoughts:  Yes.  with intent/plan  Homicidal Thoughts:  Yes.  without intent/plan  Judgement:  Impaired  Insight:  Lacking  Psychomotor Activity:  Normal  Akathisia:  No  Fund of Knowledge:  Fair      Assets:  Communication Skills  Cognition:  WNL  ADL's:  Intact  AIMS (if indicated):        Other History   These have been pulled in through the EMR, reviewed, and updated if appropriate.  Family History:  The patient's family  history includes Cancer in his mother; Diabetes in his maternal grandmother, mother, and sister.  Medical History: Past Medical History:  Diagnosis Date   ADHD (attention deficit hyperactivity disorder), inattentive type    Chronic midline low back pain with right-sided sciatica 02/11/2018   Chronic right shoulder pain 02/11/2018   DKA (diabetic ketoacidosis) 08/17/2020   DOE (dyspnea on exertion) 05/19/2019   Onset 2015/16 05/19/2019   Walked RA x two laps =  approx 526ft @ brisk pace - stopped due to end of study with sats of 98 % at the end of the study and c/o sob s cp  > rec CPST next  - alpha one AT  Screen   05/19/19 MM  152  - CPST  06/30/19  FEV1 3.79 (93%) with ratio 0.73 and nl f/v loop: ex study  probabably  wnl when wt factored  In  - sob with ventilatory reserve suggested sub max effort and de   Hyperlipidemia due to type 1 diabetes mellitus 04/18/2018   Hypertension associated with diabetes 04/18/2018   Trial off acei 04/07/2019 due to cough > improved by 07/03/2019 with just a typical smoker's rattle (no longer cough to point of gag/vomit)   Hypothyroidism    Major depressive disorder 10/09/2019   Moderate nonproliferative diabetic retinopathy of both eyes (HCC) 05/19/2019   Tobacco dependence 10/09/2019   Type 1 diabetes mellitus with diabetic polyneuropathy 04/18/2018    Surgical History: Past Surgical History:  Procedure Laterality Date   DENTAL SURGERY     EAR CYST EXCISION Right 03/24/2020   Procedure: EXCISION PRE-AURICULAR CYST;  Surgeon: Milissa Hamming, MD;  Location: [facility];  Service: ENT;  Laterality: Right;  diabetic insulin  pump   PREAURICULAR CYST EXCISION Left 07/07/2020   Procedure: EXCISION PREAURICULAR CYST ADULT;  Surgeon: Milissa Hamming, MD;  Location: [facility];  Service: ENT;  Laterality: Left;     Medications:   Current Facility-Administered Medications:    acetaminophen  (TYLENOL ) tablet 650 mg, 650 mg, Oral, Q6H PRN **OR**  acetaminophen  (TYLENOL ) suppository 650 mg, 650 mg, Rectal, Q6H PRN, Paudel, Keshab, MD   atorvastatin  (LIPITOR) tablet 10 mg, 10 mg, Oral, Daily, Amin, Sumayya, MD, 10 mg at 11/01/23 2144   Chlorhexidine Gluconate Cloth 2 % PADS 6 each, 6 each, Topical, Daily, Paudel, Nena, MD, 6 each at 11/01/23 1000   DULoxetine  (CYMBALTA )  DR capsule 60 mg, 60 mg, Oral, Daily, Amin, Sumayya, MD, 60 mg at 11/02/23 9041   enoxaparin  (LOVENOX ) injection 40 mg, 40 mg, Subcutaneous, Q24H, Paudel, Keshab, MD, 40 mg at 11/01/23 2144   folic acid (FOLVITE) tablet 1 mg, 1 mg, Oral, Daily, Paudel, Keshab, MD, 1 mg at 11/02/23 9041   insulin  aspart (novoLOG ) injection 0-5 Units, 0-5 Units, Subcutaneous, QHS, Amin, Sumayya, MD   insulin  aspart (novoLOG ) injection 0-9 Units, 0-9 Units, Subcutaneous, TID WC, Amin, Sumayya, MD, 2 Units at 11/02/23 1205   insulin  aspart (novoLOG ) injection 3 Units, 3 Units, Subcutaneous, TID WC, Amin, Sumayya, MD, 3 Units at 11/02/23 1200   insulin  glargine (LANTUS) injection 24 Units, 24 Units, Subcutaneous, Daily, Amin, Sumayya, MD, 24 Units at 11/02/23 1100   levothyroxine  (SYNTHROID ) tablet 250 mcg, 250 mcg, Oral, Daily, Paudel, Keshab, MD, 250 mcg at 11/02/23 0459   LORazepam (ATIVAN) tablet 1-4 mg, 1-4 mg, Oral, Q1H PRN **OR** LORazepam (ATIVAN) injection 1-4 mg, 1-4 mg, Intravenous, Q1H PRN, Paudel, Nena, MD   morphine  (PF) 2 MG/ML injection 2 mg, 2 mg, Intravenous, Q4H PRN, Paudel, Keshab, MD, 2 mg at 11/02/23 9167   multivitamin with minerals tablet 1 tablet, 1 tablet, Oral, Daily, Paudel, Keshab, MD, 1 tablet at 11/02/23 9041   ondansetron  (ZOFRAN ) tablet 4 mg, 4 mg, Oral, Q6H PRN **OR** ondansetron  (ZOFRAN ) injection 4 mg, 4 mg, Intravenous, Q6H PRN, Paudel, Keshab, MD   Oral care mouth rinse, 15 mL, Mouth Rinse, PRN, Paudel, Keshab, MD   oxyCODONE  (Oxy IR/ROXICODONE ) immediate release tablet 5 mg, 5 mg, Oral, Q4H PRN, Paudel, Keshab, MD, 5 mg at 11/01/23 1555   pantoprazole   (PROTONIX ) EC tablet 40 mg, 40 mg, Oral, Daily, Amin, Sumayya, MD, 40 mg at 11/02/23 0958   polyethylene glycol (MIRALAX  / GLYCOLAX ) packet 17 g, 17 g, Oral, Daily PRN, Paudel, Keshab, MD   thiamine (VITAMIN B1) tablet 100 mg, 100 mg, Oral, Daily, 100 mg at 11/02/23 0958 **OR** thiamine (VITAMIN B1) injection 100 mg, 100 mg, Intravenous, Daily, Paudel, Keshab, MD  Allergies: No Known Allergies

## 2023-11-03 ENCOUNTER — Other Ambulatory Visit: Payer: Self-pay

## 2023-11-03 DIAGNOSIS — E162 Hypoglycemia, unspecified: Secondary | ICD-10-CM | POA: Diagnosis not present

## 2023-11-03 LAB — GLUCOSE, CAPILLARY
Glucose-Capillary: 214 mg/dL — ABNORMAL HIGH (ref 70–99)
Glucose-Capillary: 28 mg/dL — CL (ref 70–99)
Glucose-Capillary: 28 mg/dL — CL (ref 70–99)
Glucose-Capillary: 115 mg/dL — ABNORMAL HIGH (ref 70–99)
Glucose-Capillary: 167 mg/dL — ABNORMAL HIGH (ref 70–99)
Glucose-Capillary: 109 mg/dL — ABNORMAL HIGH (ref 70–99)

## 2023-11-03 MED ORDER — INFLUENZA VIRUS VACC SPLIT PF (FLUZONE) 0.5 ML IM SUSY
0.5000 mL | PREFILLED_SYRINGE | INTRAMUSCULAR | Status: AC
  Administered 2023-11-04: 0.5 mL via INTRAMUSCULAR
  Filled 2023-11-03: qty 0.5

## 2023-11-03 MED ORDER — DEXTROSE 50 % IV SOLN
INTRAVENOUS | Status: AC
  Administered 2023-11-03: 50 mL via INTRAVENOUS
  Filled 2023-11-03: qty 50

## 2023-11-03 MED ORDER — DEXTROSE 50 % IV SOLN: 1.0000 | Freq: Once | INTRAVENOUS | Status: AC

## 2023-11-03 NOTE — Progress Notes (Addendum)
 Progress Note    Ronald Banks  FMW:[id number] DOB: 25-Jul-1975  DOA: 10/31/2023 PCP: Vicci Barnie NOVAK, MD      Brief Narrative:    Medical records reviewed and are as summarized below:  Ronald Banks is a 48 y.o. male  with medical history significant for type 1 diabetes on insulin  pump, HTN, HLD, hypothyroidism, depression, polysubstance use disorder (including alcohol, tobacco, amphetamines, cannabinoids), who was brought to the emergency department because of unresponsiveness in a private vehicle.  Reportedly, patient had been out drinking with his friend and complained of not feeling well so requested to be taken home.  However, on the way home, patient apparently became unresponsive in the vehicle so his friend drove him to the emergency room instead.  In the ED, patient was hypoglycemic with glucose of 35.  Ethanol level was 112.  UDS positive for cocaine, amphetamine, cannabinoids.  He was treated with IV 50% dextrose  followed by 10% dextrose  maintenance fluids because of recurrent hypoglycemia.  Patient later reported that he had intentionally taking excess dose of insulin  and attempt to kill himself.  Psychiatrist was consulted and patient was placed under involuntary commitment.     Assessment/Plan:   Principal Problem:   Hypoglycemia Active Problems:   ETOH abuse   Polysubstance abuse (HCC)   Suicide attempt (HCC)   Type 1 diabetes mellitus with diabetic polyneuropathy   Essential hypertension   Hypothyroidism   Tobacco dependence   Suicidal ideation   Body mass index is 26.98 kg/m.   Recurrent hypoglycemia: [place] had been stable but glucose dropped today to 28.  He was treated with IV 50 mL of 50% dextrose . Decrease Lantus from 24 units daily to 18 units daily.  Continue NovoLog  3 units 3 times daily with meals.  Use NovoLog  as needed for hyperglycemia.  Monitor glucose levels closely and adjust insulin  accordingly. He had an insulin  pump prior to  admission but this has been discontinued.   Acute metabolic encephalopathy: Resolved He had previously threatened to walk out of the hospital but he appears calm now.   Depression with suicidal attempt: Patient reported taking extra dose of insulin  in an attempt to kill himself.  Currently, he denies any suicidal or homicidal ideation.  Continue one-to-one watch for safety.  He is under IVC.  Plan to transfer to behavioral health unit for further management. Follow-up with psychiatrist.   Polysubstance use disorder (alcohol, tobacco, cocaine, methamphetamine, cannabinoids) counseled to quit. Discontinue IV morphine .  Use oxycodone  as needed for pain.   Hypothyroidism: TSH was 61.  Continue Synthroid .  Patient had not been taking Synthroid  at home.   Comorbidities include hypertension, medical nonadherence, diabetic polyneuropathy   Diet Order             Diet Carb Modified Fluid consistency: Thin; Room service appropriate? Yes  Diet effective now                                  Consultants: Psychiatrist  Procedures: None    Medications:    atorvastatin   10 mg Oral Daily   Chlorhexidine Gluconate Cloth  6 each Topical Daily   DULoxetine   60 mg Oral Daily   enoxaparin  (LOVENOX ) injection  40 mg Subcutaneous Q24H   folic acid  1 mg Oral Daily   insulin  aspart  0-5 Units Subcutaneous QHS   insulin  aspart  0-9 Units Subcutaneous TID WC  insulin  aspart  3 Units Subcutaneous TID WC   insulin  glargine  24 Units Subcutaneous Daily   levothyroxine   250 mcg Oral Daily   multivitamin with minerals  1 tablet Oral Daily   pantoprazole   40 mg Oral Daily   thiamine  100 mg Oral Daily   Or   thiamine  100 mg Intravenous Daily   Continuous Infusions:   Anti-infectives (From admission, onward)    Start     Dose/Rate Route Frequency Ordered Stop   10/31/23 0530  vancomycin (VANCOCIN) IVPB 1000 mg/200 mL premix        1,000 mg 200 mL/hr over 60 Minutes  Intravenous  Once 10/31/23 0526 10/31/23 0701   10/31/23 0530  ceFEPIme (MAXIPIME) 2 g in sodium chloride  0.9 % 100 mL IVPB        2 g 200 mL/hr over 30 Minutes Intravenous  Once 10/31/23 0526 10/31/23 9364              Family Communication/Anticipated D/C date and plan/Code Status   DVT prophylaxis: enoxaparin  (LOVENOX ) injection 40 mg Start: 10/31/23 2200 SCDs Start: 10/31/23 0736     Code Status: Full Code  Family Communication: None Disposition Plan: Plan to discharge to behavioral health unit   Status is: Inpatient Remains inpatient appropriate because: Recurrent hypoglycemia       Subjective:   Interval events noted.  No complaints.  He he does not report feeling depressed or suicidal.  No pain.  Sitter at the bedside  Objective:    Vitals:   11/02/23 1501 11/02/23 1913 11/03/23 0331 11/03/23 0846  BP: (!) 128/57 132/68 (!) 112/55 115/63  Pulse: (!) 53 (!) 48    Resp: 16 20 16 16   Temp: 98 F (36.7 C) 98.1 F (36.7 C) 98.4 F (36.9 C) 98 F (36.7 C)  TempSrc:      SpO2: 98% 99% 96% 97%  Weight:      Height:       No data found.   Intake/Output Summary (Last 24 hours) at 11/03/2023 1237 Last data filed at 11/03/2023 0116 Gross per 24 hour  Intake 200 ml  Output --  Net 200 ml   Filed Weights   10/31/23 0334 10/31/23 0730 10/31/23 0842  Weight: 89 kg 89 kg 85.3 kg    Exam:  GEN: NAD SKIN: Warm and dry EYES: No pallor or icterus ENT: MMM CV: RRR PULM: CTA B ABD: soft, ND, NT, +BS CNS: AAO x 3, non focal EXT: No edema or tenderness PSYCH: Calm and cooperative, no delusions or hallucinations.  Speech is clear        Data Reviewed:   I have personally reviewed following labs and imaging studies:  Labs: Labs show the following:   Basic Metabolic Panel: Recent Labs  Lab 10/31/23 0402 10/31/23 1051 11/01/23 0515  NA 140  --  136  K 3.0* 4.5 4.1  CL 105  --  109  CO2 24  --  24  GLUCOSE 160*  --  364*  BUN 8  --   11  CREATININE 0.99  --  1.13  CALCIUM  8.7*  --  7.7*  MG  --   --  2.0   GFR Estimated Creatinine Clearance: 80.8 mL/min (by C-G formula based on SCr of 1.13 mg/dL). Liver Function Tests: Recent Labs  Lab 11/01/23 0515  AST 14*  ALT 9  ALKPHOS 36*  BILITOT 0.9  PROT 4.8*  ALBUMIN 2.7*   No results for input(s):  LIPASE, AMYLASE in the last 168 hours. No results for input(s): AMMONIA in the last 168 hours. Coagulation profile Recent Labs  Lab 11/01/23 0515  INR 1.1    CBC: Recent Labs  Lab 10/31/23 0402 11/01/23 0515  WBC 6.5 7.3  NEUTROABS 4.3  --   HGB 13.3 12.6*  HCT 40.1 38.8*  MCV 94.4 94.9  PLT 252 230   Cardiac Enzymes: No results for input(s): CKTOTAL, CKMB, CKMBINDEX, TROPONINI in the last 168 hours. BNP (last 3 results) No results for input(s): PROBNP in the last 8760 hours. CBG: Recent Labs  Lab 11/02/23 1627 11/02/23 2028 11/03/23 0850 11/03/23 1208 11/03/23 1210  GLUCAP 172* 165* 214* 28* 28*   D-Dimer: No results for input(s): DDIMER in the last 72 hours. Hgb A1c: No results for input(s): HGBA1C in the last 72 hours. Lipid Profile: No results for input(s): CHOL, HDL, LDLCALC, TRIG, CHOLHDL, LDLDIRECT in the last 72 hours. Thyroid  function studies: No results for input(s): TSH, T4TOTAL, T3FREE, THYROIDAB in the last 72 hours.  Invalid input(s): FREET3 Anemia work up: No results for input(s): VITAMINB12, FOLATE, FERRITIN, TIBC, IRON, RETICCTPCT in the last 72 hours. Sepsis Labs: Recent Labs  Lab 10/31/23 0402 10/31/23 0951 11/01/23 0515  WBC 6.5  --  7.3  LATICACIDVEN 2.8* 2.0*  --     Microbiology Recent Results (from the past 240 hours)  Blood culture (routine x 2)     Status: None (Preliminary result)   Collection Time: 10/31/23  4:03 AM   Specimen: BLOOD LEFT HAND  Result Value Ref Range Status   Specimen Description BLOOD LEFT HAND  Final   Special Requests   Final     BOTTLES DRAWN AEROBIC AND ANAEROBIC Blood Culture results may not be optimal due to an inadequate volume of blood received in culture bottles   Culture   Final    NO GROWTH 3 DAYS Performed at [facility], [address] [place], [place] [address]    Report Status PENDING  Incomplete  Blood culture (routine x 2)     Status: None (Preliminary result)   Collection Time: 10/31/23  4:03 AM   Specimen: BLOOD  Result Value Ref Range Status   Specimen Description BLOOD LEFT [facility]  Final   Special Requests   Final    BOTTLES DRAWN AEROBIC AND ANAEROBIC Blood Culture adequate volume   Culture   Final    NO GROWTH 3 DAYS Performed at [facility], [address] [place], [place] [address]    Report Status PENDING  Incomplete  MRSA Next Gen by PCR, Nasal     Status: None   Collection Time: 10/31/23  8:45 AM   Specimen: Nasal Mucosa; Nasal Swab  Result Value Ref Range Status   MRSA by PCR Next Gen NOT DETECTED NOT DETECTED Final    Comment: (NOTE) The GeneXpert MRSA Assay (FDA approved for NASAL specimens only), is one component of a comprehensive MRSA colonization surveillance program. It is not intended to diagnose MRSA infection nor to guide or monitor treatment for MRSA infections. Test performance is not FDA approved in patients less than 16 years old. Performed at [facility], [address] [place], [place] [address]     Procedures and diagnostic studies:  No results found.             LOS: 3 days   Cornelia Walraven  [facility]   Pager on [email or web address]. If 7PM-7AM, please contact night-coverage at [email or web address]  11/03/2023, 12:37 PM

## 2023-11-03 NOTE — Progress Notes (Signed)
 Patients blood sugar was 28, writer made aware, notified provider. Writer administered 25ml Dextrose  IV and patient was eating lunch, otherwise asymptomatic. Rechecked blood sugar 15 minutes later, was 115. MD made aware.

## 2023-11-04 DIAGNOSIS — E162 Hypoglycemia, unspecified: Secondary | ICD-10-CM | POA: Diagnosis not present

## 2023-11-04 LAB — GLUCOSE, CAPILLARY
Glucose-Capillary: 211 mg/dL — ABNORMAL HIGH (ref 70–99)
Glucose-Capillary: 151 mg/dL — ABNORMAL HIGH (ref 70–99)
Glucose-Capillary: 37 mg/dL — CL (ref 70–99)
Glucose-Capillary: 120 mg/dL — ABNORMAL HIGH (ref 70–99)
Glucose-Capillary: 82 mg/dL (ref 70–99)
Glucose-Capillary: 272 mg/dL — ABNORMAL HIGH (ref 70–99)

## 2023-11-04 MED ORDER — ZIPRASIDONE MESYLATE 20 MG IM SOLR
20.0000 mg | INTRAMUSCULAR | Status: DC | PRN
Start: 2023-11-04 — End: 2023-11-04

## 2023-11-04 MED ORDER — LORAZEPAM 2 MG/ML IJ SOLN: 2.0000 mg | Freq: Four times a day (QID) | INTRAMUSCULAR | Status: DC | PRN

## 2023-11-04 MED ORDER — INSULIN GLARGINE 100 UNIT/ML ~~LOC~~ SOLN
20.0000 [IU] | Freq: Every day | SUBCUTANEOUS | Status: DC
  Administered 2023-11-04 – 2023-11-05 (×2): 20 [IU] via SUBCUTANEOUS
  Filled 2023-11-04 (×3): qty 0.2

## 2023-11-04 MED ORDER — DEXTROSE 50 % IV SOLN
25.0000 mL | Freq: Once | INTRAVENOUS | Status: AC
  Administered 2023-11-04: 25 mL via INTRAVENOUS

## 2023-11-04 MED ORDER — QUETIAPINE FUMARATE 25 MG PO TABS
25.0000 mg | ORAL_TABLET | Freq: Every day | ORAL | Status: AC
  Administered 2023-11-04: 25 mg via ORAL
  Filled 2023-11-04: qty 1

## 2023-11-04 MED ORDER — OXYCODONE HCL 5 MG PO TABS
5.0000 mg | ORAL_TABLET | Freq: Three times a day (TID) | ORAL | Status: DC | PRN
  Administered 2023-11-04 – 2023-11-06 (×7): 5 mg via ORAL
  Filled 2023-11-04 (×7): qty 1

## 2023-11-04 NOTE — Plan of Care (Signed)

## 2023-11-04 NOTE — Inpatient Diabetes Management (Signed)
 Inpatient Diabetes Program Recommendations  AACE/ADA: New Consensus Statement on Inpatient Glycemic Control (2015)  Target Ranges:  Prepandial:   less than 140 mg/dL      Peak postprandial:   less than 180 mg/dL (1-2 hours)      Critically ill patients:  140 - 180 mg/dL   Lab Results  Component Value Date   GLUCAP 151 (H) 11/04/2023   HGBA1C 9.8 (H) 10/31/2023    Review of Glycemic Control  Diabetes history: DM1 Outpatient Diabetes medications: T-Slim insulin  pump with Novolog  (total basal 34.3 units/24 hours; I:CR 1:8 grams; I:SF 1:40 mg/dl), Dexcom G7  Current orders for Inpatient glycemic control: Lantus 20 units daily, Novolog  0-9 TID with meals and 0-5 HS + 3 units TID  Hypoglycemia of 37 mg/dL this am  Inpatient Diabetes Program Recommendations:    Consider decreasing Novolog  to 0-6 TID  Continue to follow glucose trends.  Thank you. Shona Brandy, RD, LDN, CDCES Inpatient Diabetes Coordinator [phone]

## 2023-11-04 NOTE — Progress Notes (Signed)
 Patients cbg at lunch was 37. MD notified. Writer administered 25ml Dextrose  and gave orange juice. Rechecked in 15 minutes-CBG 151. MD made aware. Patient was otherwise asymptomatic.

## 2023-11-04 NOTE — Progress Notes (Signed)
 Pt observed sitting on the side of the bed, appearing confused and mildly combative. Pt refused nursing care. PRN ativan PO given as ordered. Will continue to monitor and reassess pt's response to medication and behavior. Environment maintained for pt safety

## 2023-11-04 NOTE — Progress Notes (Addendum)
 Progress Note    Ronald Banks  FMW:[id number] DOB: 1975-05-04  DOA: 10/31/2023 PCP: Vicci Barnie NOVAK, MD      Brief Narrative:    Medical records reviewed and are as summarized below:  Ronald Banks is a 48 y.o. male  with medical history significant for type 1 diabetes on insulin  pump, HTN, HLD, hypothyroidism, depression, polysubstance use disorder (including alcohol, tobacco, amphetamines, cannabinoids), who was brought to the emergency department because of unresponsiveness in a private vehicle.  Reportedly, patient had been out drinking with his friend and complained of not feeling well so requested to be taken home.  However, on the way home, patient apparently became unresponsive in the vehicle so his friend drove him to the emergency room instead.  In the ED, patient was hypoglycemic with glucose of 35.  Ethanol level was 112.  UDS positive for cocaine, amphetamine, cannabinoids.  He was treated with IV 50% dextrose  followed by 10% dextrose  maintenance fluids because of recurrent hypoglycemia.  Patient later reported that he had intentionally taking excess dose of insulin  and attempt to kill himself.  Psychiatrist was consulted and patient was placed under involuntary commitment.     Assessment/Plan:   Principal Problem:   Hypoglycemia Active Problems:   ETOH abuse   Polysubstance abuse (HCC)   Suicide attempt (HCC)   Type 1 diabetes mellitus with diabetic polyneuropathy   Essential hypertension   Hypothyroidism   Tobacco dependence   Suicidal ideation   Body mass index is 26.98 kg/m.   Recurrent hypoglycemia: Glucose dropped to 37 today.  He was given IV 50 mL of 50% dextrose  for hypoglycemia.  Lantus had been decreased from 24 to 20 units daily.  Discontinue scheduled NovoLog  3 units 3 times daily with meals..  Use NovoLog  as needed for hyperglycemia. Monitor glucose levels closely and adjust insulin  accordingly. He had an insulin  pump prior to admission  but this has been discontinued.   Acute metabolic encephalopathy: Resolved He had previously threatened to walk out of the hospital but he appears calm now.   Depression with suicidal attempt: Patient reported taking extra dose of insulin  in an attempt to kill himself.  Currently, he denies any suicidal or homicidal ideation.  Continue one-to-one watch for safety.  He is under IVC.  Plan to transfer to behavioral health unit for further management. Follow-up with psychiatrist.   Polysubstance use disorder (alcohol, tobacco, cocaine, methamphetamine, cannabinoids) counseled to quit. Tylenol , oxycodone  as needed for pain.  IV morphine  was discontinued on 11/03/2023.   Hypothyroidism: TSH was 61.  Continue Synthroid .  Patient had not been taking Synthroid  at home.   Comorbidities include hypertension, medical nonadherence, diabetic polyneuropathy   Diet Order             Diet Carb Modified Fluid consistency: Thin; Room service appropriate? Yes  Diet effective now                                  Consultants: Psychiatrist  Procedures: None    Medications:    atorvastatin   10 mg Oral Daily   Chlorhexidine Gluconate Cloth  6 each Topical Daily   dextrose   25 mL Intravenous Once   DULoxetine   60 mg Oral Daily   enoxaparin  (LOVENOX ) injection  40 mg Subcutaneous Q24H   folic acid  1 mg Oral Daily   insulin  aspart  0-5 Units Subcutaneous QHS   insulin   aspart  0-9 Units Subcutaneous TID WC   insulin  glargine  20 Units Subcutaneous Daily   levothyroxine   250 mcg Oral Daily   multivitamin with minerals  1 tablet Oral Daily   pantoprazole   40 mg Oral Daily   thiamine  100 mg Oral Daily   Or   thiamine  100 mg Intravenous Daily   Continuous Infusions:   Anti-infectives (From admission, onward)    Start     Dose/Rate Route Frequency Ordered Stop   10/31/23 0530  vancomycin (VANCOCIN) IVPB 1000 mg/200 mL premix        1,000 mg 200 mL/hr over 60  Minutes Intravenous  Once 10/31/23 0526 10/31/23 0701   10/31/23 0530  ceFEPIme (MAXIPIME) 2 g in sodium chloride  0.9 % 100 mL IVPB        2 g 200 mL/hr over 30 Minutes Intravenous  Once 10/31/23 0526 10/31/23 9364              Family Communication/Anticipated D/C date and plan/Code Status   DVT prophylaxis: enoxaparin  (LOVENOX ) injection 40 mg Start: 10/31/23 2200 SCDs Start: 10/31/23 0736     Code Status: Full Code  Family Communication: None Disposition Plan: Plan to discharge to behavioral health unit   Status is: Inpatient Remains inpatient appropriate because: Recurrent hypoglycemia       Subjective:   Interval events noted.  He has chronic pain in the hands and back.  He does not want to go to the behavioral health unit and prefers to go home.  He thinks he is being held against his well.  Sitter at the bedside.  Objective:    Vitals:   11/03/23 1624 11/03/23 1923 11/04/23 0605 11/04/23 0758  BP: 113/71 (!) 112/59 121/70 107/68  Pulse: (!) 46 (!) 50 (!) 52 (!) 44  Resp: 16 16 16 16   Temp: 97.8 F (36.6 C) 98.6 F (37 C) 97.9 F (36.6 C) 98 F (36.7 C)  TempSrc:      SpO2: 96% 98% 96% 98%  Weight:      Height:       No data found.   Intake/Output Summary (Last 24 hours) at 11/04/2023 1202 Last data filed at 11/04/2023 0000 Gross per 24 hour  Intake 900 ml  Output --  Net 900 ml   Filed Weights   10/31/23 0334 10/31/23 0730 10/31/23 0842  Weight: 89 kg 89 kg 85.3 kg    Exam:   GEN: NAD SKIN: Warm and dry EYES: No pallor or icterus ENT: MMM CV: RRR PULM: CTA B ABD: soft, ND, NT, +BS CNS: AAO x 3, non focal EXT: No edema or tenderness PSYCH: No delusions or hallucinations       Data Reviewed:   I have personally reviewed following labs and imaging studies:  Labs: Labs show the following:   Basic Metabolic Panel: Recent Labs  Lab 10/31/23 0402 10/31/23 1051 11/01/23 0515  NA 140  --  136  K 3.0* 4.5 4.1  CL 105   --  109  CO2 24  --  24  GLUCOSE 160*  --  364*  BUN 8  --  11  CREATININE 0.99  --  1.13  CALCIUM  8.7*  --  7.7*  MG  --   --  2.0   GFR Estimated Creatinine Clearance: 80.8 mL/min (by C-G formula based on SCr of 1.13 mg/dL). Liver Function Tests: Recent Labs  Lab 11/01/23 0515  AST 14*  ALT 9  ALKPHOS 36*  BILITOT 0.9  PROT 4.8*  ALBUMIN 2.7*   No results for input(s): LIPASE, AMYLASE in the last 168 hours. No results for input(s): AMMONIA in the last 168 hours. Coagulation profile Recent Labs  Lab 11/01/23 0515  INR 1.1    CBC: Recent Labs  Lab 10/31/23 0402 11/01/23 0515  WBC 6.5 7.3  NEUTROABS 4.3  --   HGB 13.3 12.6*  HCT 40.1 38.8*  MCV 94.4 94.9  PLT 252 230   Cardiac Enzymes: No results for input(s): CKTOTAL, CKMB, CKMBINDEX, TROPONINI in the last 168 hours. BNP (last 3 results) No results for input(s): PROBNP in the last 8760 hours. CBG: Recent Labs  Lab 11/03/23 1249 11/03/23 1621 11/03/23 2105 11/04/23 0801 11/04/23 1150  GLUCAP 115* 167* 109* 211* 37*   D-Dimer: No results for input(s): DDIMER in the last 72 hours. Hgb A1c: No results for input(s): HGBA1C in the last 72 hours. Lipid Profile: No results for input(s): CHOL, HDL, LDLCALC, TRIG, CHOLHDL, LDLDIRECT in the last 72 hours. Thyroid  function studies: No results for input(s): TSH, T4TOTAL, T3FREE, THYROIDAB in the last 72 hours.  Invalid input(s): FREET3 Anemia work up: No results for input(s): VITAMINB12, FOLATE, FERRITIN, TIBC, IRON, RETICCTPCT in the last 72 hours. Sepsis Labs: Recent Labs  Lab 10/31/23 0402 10/31/23 0951 11/01/23 0515  WBC 6.5  --  7.3  LATICACIDVEN 2.8* 2.0*  --     Microbiology Recent Results (from the past 240 hours)  Blood culture (routine x 2)     Status: None (Preliminary result)   Collection Time: 10/31/23  4:03 AM   Specimen: BLOOD LEFT HAND  Result Value Ref Range Status   Specimen  Description BLOOD LEFT HAND  Final   Special Requests   Final    BOTTLES DRAWN AEROBIC AND ANAEROBIC Blood Culture results may not be optimal due to an inadequate volume of blood received in culture bottles   Culture   Final    NO GROWTH 4 DAYS Performed at [facility], [address] [place], [place] [address]    Report Status PENDING  Incomplete  Blood culture (routine x 2)     Status: None (Preliminary result)   Collection Time: 10/31/23  4:03 AM   Specimen: BLOOD  Result Value Ref Range Status   Specimen Description BLOOD LEFT [facility]  Final   Special Requests   Final    BOTTLES DRAWN AEROBIC AND ANAEROBIC Blood Culture adequate volume   Culture   Final    NO GROWTH 4 DAYS Performed at [facility], [address] [place], [place] [address]    Report Status PENDING  Incomplete  MRSA Next Gen by PCR, Nasal     Status: None   Collection Time: 10/31/23  8:45 AM   Specimen: Nasal Mucosa; Nasal Swab  Result Value Ref Range Status   MRSA by PCR Next Gen NOT DETECTED NOT DETECTED Final    Comment: (NOTE) The GeneXpert MRSA Assay (FDA approved for NASAL specimens only), is one component of a comprehensive MRSA colonization surveillance program. It is not intended to diagnose MRSA infection nor to guide or monitor treatment for MRSA infections. Test performance is not FDA approved in patients less than 9 years old. Performed at [facility], [address] [place], [place] [address]     Procedures and diagnostic studies:  No results found.             LOS: 4 days   Brennley Curtice  [facility]  Pager on [email or web address]. If 7PM-7AM, please contact night-coverage at [email or web address]     11/04/2023, 12:02 PM

## 2023-11-05 DIAGNOSIS — E162 Hypoglycemia, unspecified: Secondary | ICD-10-CM | POA: Diagnosis not present

## 2023-11-05 LAB — CULTURE, BLOOD (ROUTINE X 2)
Culture: NO GROWTH
Culture: NO GROWTH
Special Requests: ADEQUATE

## 2023-11-05 LAB — GLUCOSE, CAPILLARY
Glucose-Capillary: 332 mg/dL — ABNORMAL HIGH (ref 70–99)
Glucose-Capillary: 271 mg/dL — ABNORMAL HIGH (ref 70–99)
Glucose-Capillary: 393 mg/dL — ABNORMAL HIGH (ref 70–99)
Glucose-Capillary: 188 mg/dL — ABNORMAL HIGH (ref 70–99)
Glucose-Capillary: 239 mg/dL — ABNORMAL HIGH (ref 70–99)

## 2023-11-05 MED ORDER — INSULIN ASPART 100 UNIT/ML IJ SOLN: 0.0000 [IU] | Freq: Three times a day (TID) | INTRAMUSCULAR | Status: AC

## 2023-11-05 MED ORDER — IBUPROFEN 400 MG PO TABS
400.0000 mg | ORAL_TABLET | ORAL | Status: DC | PRN
Start: 2023-11-05 — End: 2023-11-06
  Administered 2023-11-05: 400 mg via ORAL
  Filled 2023-11-05: qty 1

## 2023-11-05 MED ORDER — INSULIN ASPART 100 UNIT/ML IJ SOLN: 0.0000 [IU] | Freq: Every day | INTRAMUSCULAR | Status: AC

## 2023-11-05 MED ORDER — ACETAMINOPHEN 325 MG PO TABS: 650.0000 mg | ORAL_TABLET | Freq: Four times a day (QID) | ORAL | Status: DC | PRN

## 2023-11-05 MED ORDER — INSULIN GLARGINE 100 UNIT/ML ~~LOC~~ SOLN: 20.0000 [IU] | Freq: Every day | SUBCUTANEOUS | Status: DC

## 2023-11-05 NOTE — Inpatient Diabetes Management (Signed)
 Inpatient Diabetes Program Recommendations  AACE/ADA: New Consensus Statement on Inpatient Glycemic Control (2015)  Target Ranges:  Prepandial:   less than 140 mg/dL      Peak postprandial:   less than 180 mg/dL (1-2 hours)      Critically ill patients:  140 - 180 mg/dL   Lab Results  Component Value Date   GLUCAP 271 (H) 11/05/2023   HGBA1C 9.8 (H) 10/31/2023    Review of Glycemic Control  Latest Reference Range & Units 11/04/23 11:50 11/04/23 12:17 11/04/23 15:55 11/04/23 16:26 11/04/23 20:51 11/05/23 08:21  Glucose-Capillary 70 - 99 mg/dL 37 (LL) 848 (H) 82 879 (H) 272 (H) 271 (H)   Diabetes history: DM 1 Outpatient Diabetes medications: T-Slim insulin  pump with Novolog  (total basal 34.3 units/24 hours; I:CR 1:8 grams; I:SF 1:40 mg/dl), Dexcom G7 Current orders for Inpatient glycemic control: Novolog  0-9 units tid with meals and HS Lantus 20 units daily Inpatient Diabetes Program Recommendations:    Note low blood sugar yesterday after receiving both meal coverage and correction.  Fasting is still elevated however hesitant to recommend changes today due to labile blood sugars.  Will follow.   Thanks,  Randall Bullocks, RN, BC-ADM Inpatient Diabetes Coordinator Pager [phone]  (8a-5p)

## 2023-11-05 NOTE — Progress Notes (Addendum)
 Progress Note    GENERAL WEARING  FMW:[id number] DOB: 10/02/1975  DOA: 10/31/2023 PCP: Vicci Barnie NOVAK, MD      Brief Narrative:    Medical records reviewed and are as summarized below:  Ronald Banks is a 48 y.o. male  with medical history significant for type 1 diabetes on insulin  pump, HTN, HLD, hypothyroidism, depression, polysubstance use disorder (including alcohol, tobacco, amphetamines, cannabinoids), who was brought to the emergency department because of unresponsiveness in a private vehicle.  Reportedly, patient had been out drinking with his friend and complained of not feeling well so requested to be taken home.  However, on the way home, patient apparently became unresponsive in the vehicle so his friend drove him to the emergency room instead.  In the ED, patient was hypoglycemic with glucose of 35.  Ethanol level was 112.  UDS positive for cocaine, amphetamine, cannabinoids.  He was treated with IV 50% dextrose  followed by 10% dextrose  maintenance fluids because of recurrent hypoglycemia.  Patient later reported that he had intentionally taking excess dose of insulin  and attempt to kill himself.  Psychiatrist was consulted and patient was placed under involuntary commitment.     Assessment/Plan:   Principal Problem:   Hypoglycemia Active Problems:   ETOH abuse   Polysubstance abuse (HCC)   Suicide attempt (HCC)   Type 1 diabetes mellitus with diabetic polyneuropathy   Essential hypertension   Hypothyroidism   Tobacco dependence   Suicidal ideation   Body mass index is 26.98 kg/m.   Recurrent hypoglycemia: Glucose spiked to 393 today.  Continue Lantus 20 units daily and NovoLog  as needed for hyperglycemia.  Scheduled NovoLog  with meals still on hold. Monitor glucose levels closely and adjust insulin  as needed. He had an insulin  pump prior to admission but this has been discontinued.   Acute metabolic encephalopathy: Resolved He had previously  threatened to walk out of the hospital but he appears calm now.   Depression with suicidal attempt: Patient reported taking extra dose of insulin  in an attempt to kill himself.  Currently, he denies any suicidal or homicidal ideation.  Continue one-to-one watch for safety.  He is under IVC.  Plan to transfer to behavioral health unit for further management. Psychiatric team unable to take patient today because glucose went up to 393.  They want blood glucose to be between 70 and 350 for 24 hours prior to transfer to behavioral health unit.   Polysubstance use disorder (alcohol, tobacco, cocaine, methamphetamine, cannabinoids) counseled to quit. Tylenol , oxycodone  as needed for pain.  IV morphine  was discontinued on 11/03/2023.  Ordered Motrin  as needed for pain.   Hypothyroidism: TSH was 61.  Continue Synthroid .  Patient had not been taking Synthroid  at home.   Comorbidities include hypertension, medical nonadherence, diabetic polyneuropathy, chronic back pain.   Diet Order             Diet - low sodium heart healthy           Diet Carb Modified           Diet Carb Modified Fluid consistency: Thin; Room service appropriate? Yes  Diet effective now                                  Consultants: Psychiatrist  Procedures: None    Medications:    atorvastatin   10 mg Oral Daily   Chlorhexidine Gluconate Cloth  6  each Topical Daily   DULoxetine   60 mg Oral Daily   enoxaparin  (LOVENOX ) injection  40 mg Subcutaneous Q24H   folic acid  1 mg Oral Daily   insulin  aspart  0-5 Units Subcutaneous QHS   insulin  aspart  0-9 Units Subcutaneous TID WC   insulin  glargine  20 Units Subcutaneous Daily   levothyroxine   250 mcg Oral Daily   multivitamin with minerals  1 tablet Oral Daily   pantoprazole   40 mg Oral Daily   thiamine  100 mg Oral Daily   Or   thiamine  100 mg Intravenous Daily   Continuous Infusions:   Anti-infectives (From admission, onward)     Start     Dose/Rate Route Frequency Ordered Stop   10/31/23 0530  vancomycin (VANCOCIN) IVPB 1000 mg/200 mL premix        1,000 mg 200 mL/hr over 60 Minutes Intravenous  Once 10/31/23 0526 10/31/23 0701   10/31/23 0530  ceFEPIme (MAXIPIME) 2 g in sodium chloride  0.9 % 100 mL IVPB        2 g 200 mL/hr over 30 Minutes Intravenous  Once 10/31/23 0526 10/31/23 9364              Family Communication/Anticipated D/C date and plan/Code Status   DVT prophylaxis: enoxaparin  (LOVENOX ) injection 40 mg Start: 10/31/23 2200 SCDs Start: 10/31/23 0736     Code Status: Full Code  Family Communication: None Disposition Plan: Plan to discharge to behavioral health unit   Status is: Inpatient Remains inpatient appropriate because: Recurrent hypoglycemia       Subjective:   Interval events noted.  He complains of chronic back pain.  Sitter at the bedside.  Objective:    Vitals:   11/04/23 1610 11/04/23 2043 11/05/23 0305 11/05/23 0719  BP: 131/73 126/71 115/61 117/66  Pulse:  (!) 50 (!) 52 (!) 51  Resp: 16 16 17 18   Temp: 98 F (36.7 C) 98.3 F (36.8 C) 98 F (36.7 C) 98 F (36.7 C)  TempSrc:      SpO2: 97% 95% 97% 96%  Weight:      Height:       No data found.   Intake/Output Summary (Last 24 hours) at 11/05/2023 1229 Last data filed at 11/05/2023 1015 Gross per 24 hour  Intake 340 ml  Output --  Net 340 ml   Filed Weights   10/31/23 0334 10/31/23 0730 10/31/23 0842  Weight: 89 kg 89 kg 85.3 kg    Exam:  GEN: NAD SKIN: Warm and dry EYES: No pallor or icterus ENT: MMM CV: RRR PULM: CTA B ABD: soft, ND, NT, +BS CNS: AAO x 3, non focal EXT: No edema or tenderness PSYCH: No hallucinations, delusions or suicidal thoughts      Data Reviewed:   I have personally reviewed following labs and imaging studies:  Labs: Labs show the following:   Basic Metabolic Panel: Recent Labs  Lab 10/31/23 0402 10/31/23 1051 11/01/23 0515  NA 140  --  136   K 3.0* 4.5 4.1  CL 105  --  109  CO2 24  --  24  GLUCOSE 160*  --  364*  BUN 8  --  11  CREATININE 0.99  --  1.13  CALCIUM  8.7*  --  7.7*  MG  --   --  2.0   GFR Estimated Creatinine Clearance: 80.8 mL/min (by C-G formula based on SCr of 1.13 mg/dL). Liver Function Tests: Recent Labs  Lab 11/01/23 0515  AST 14*  ALT 9  ALKPHOS 36*  BILITOT 0.9  PROT 4.8*  ALBUMIN 2.7*   No results for input(s): LIPASE, AMYLASE in the last 168 hours. No results for input(s): AMMONIA in the last 168 hours. Coagulation profile Recent Labs  Lab 11/01/23 0515  INR 1.1    CBC: Recent Labs  Lab 10/31/23 0402 11/01/23 0515  WBC 6.5 7.3  NEUTROABS 4.3  --   HGB 13.3 12.6*  HCT 40.1 38.8*  MCV 94.4 94.9  PLT 252 230   Cardiac Enzymes: No results for input(s): CKTOTAL, CKMB, CKMBINDEX, TROPONINI in the last 168 hours. BNP (last 3 results) No results for input(s): PROBNP in the last 8760 hours. CBG: Recent Labs  Lab 11/04/23 1555 11/04/23 1626 11/04/23 2051 11/05/23 0821 11/05/23 1157  GLUCAP 82 120* 272* 271* 393*   D-Dimer: No results for input(s): DDIMER in the last 72 hours. Hgb A1c: No results for input(s): HGBA1C in the last 72 hours. Lipid Profile: No results for input(s): CHOL, HDL, LDLCALC, TRIG, CHOLHDL, LDLDIRECT in the last 72 hours. Thyroid  function studies: No results for input(s): TSH, T4TOTAL, T3FREE, THYROIDAB in the last 72 hours.  Invalid input(s): FREET3 Anemia work up: No results for input(s): VITAMINB12, FOLATE, FERRITIN, TIBC, IRON, RETICCTPCT in the last 72 hours. Sepsis Labs: Recent Labs  Lab 10/31/23 0402 10/31/23 0951 11/01/23 0515  WBC 6.5  --  7.3  LATICACIDVEN 2.8* 2.0*  --     Microbiology Recent Results (from the past 240 hours)  Blood culture (routine x 2)     Status: None   Collection Time: 10/31/23  4:03 AM   Specimen: BLOOD LEFT HAND  Result Value Ref Range Status    Specimen Description BLOOD LEFT HAND  Final   Special Requests   Final    BOTTLES DRAWN AEROBIC AND ANAEROBIC Blood Culture results may not be optimal due to an inadequate volume of blood received in culture bottles   Culture   Final    NO GROWTH 5 DAYS Performed at [facility], [address] [place], [place] [address]    Report Status 11/05/2023 FINAL  Final  Blood culture (routine x 2)     Status: None   Collection Time: 10/31/23  4:03 AM   Specimen: BLOOD  Result Value Ref Range Status   Specimen Description BLOOD LEFT [facility]  Final   Special Requests   Final    BOTTLES DRAWN AEROBIC AND ANAEROBIC Blood Culture adequate volume   Culture   Final    NO GROWTH 5 DAYS Performed at [facility], [address] [place], [place] [address]    Report Status 11/05/2023 FINAL  Final  MRSA Next Gen by PCR, Nasal     Status: None   Collection Time: 10/31/23  8:45 AM   Specimen: Nasal Mucosa; Nasal Swab  Result Value Ref Range Status   MRSA by PCR Next Gen NOT DETECTED NOT DETECTED Final    Comment: (NOTE) The GeneXpert MRSA Assay (FDA approved for NASAL specimens only), is one component of a comprehensive MRSA colonization surveillance program. It is not intended to diagnose MRSA infection nor to guide or monitor treatment for MRSA infections. Test performance is not FDA approved in patients less than 42 years old. Performed at [facility], [address] [place], [place] [address]     Procedures and diagnostic studies:  No results found.             LOS: 5 days  Heidi Maclin  [facility] Chartered loss adjuster on [email or web address]. If 7PM-7AM, please contact night-coverage at [email or web address]     11/05/2023, 12:29 PM

## 2023-11-05 NOTE — Plan of Care (Signed)
  Problem: Clinical Measurements: Goal: Ability to maintain clinical measurements within normal limits will improve Outcome: Progressing Goal: Will remain free from infection Outcome: Progressing   Problem: Activity: Goal: Risk for activity intolerance will decrease Outcome: Progressing   Problem: Nutrition: Goal: Adequate nutrition will be maintained Outcome: Progressing   Problem: Coping: Goal: Level of anxiety will decrease Outcome: Progressing   Problem: Elimination: Goal: Will not experience complications related to bowel motility Outcome: Progressing

## 2023-11-05 NOTE — Plan of Care (Signed)
  Problem: Education: Goal: Knowledge of General Education information will improve Description: Including pain rating scale, medication(s)/side effects and non-pharmacologic comfort measures Outcome: Progressing   Problem: Health Behavior/Discharge Planning: Goal: Ability to manage health-related needs will improve Outcome: Progressing   Problem: Clinical Measurements: Goal: Ability to maintain clinical measurements within normal limits will improve Outcome: Progressing   Problem: Clinical Measurements: Goal: Will remain free from infection Outcome: Progressing   Problem: Clinical Measurements: Goal: Diagnostic test results will improve Outcome: Progressing   Problem: Clinical Measurements: Goal: Respiratory complications will improve Outcome: Progressing   Problem: Clinical Measurements: Goal: Cardiovascular complication will be avoided Outcome: Progressing   Problem: Activity: Goal: Risk for activity intolerance will decrease Outcome: Progressing   Problem: Nutrition: Goal: Adequate nutrition will be maintained Outcome: Progressing   

## 2023-11-06 ENCOUNTER — Inpatient Hospital Stay
Admission: AD | Admit: 2023-11-06 | Discharge: 2023-11-12 | DRG: 917 | Disposition: A | Discharge status: Home / Self Care | Source: Intra-hospital | Attending: Psychiatry | Admitting: Psychiatry

## 2023-11-06 ENCOUNTER — Other Ambulatory Visit: Payer: Self-pay

## 2023-11-06 DIAGNOSIS — Z833 Family history of diabetes mellitus: Secondary | ICD-10-CM

## 2023-11-06 DIAGNOSIS — F4323 Adjustment disorder with mixed anxiety and depressed mood: Secondary | ICD-10-CM | POA: Diagnosis present

## 2023-11-06 DIAGNOSIS — E039 Hypothyroidism, unspecified: Secondary | ICD-10-CM | POA: Diagnosis present

## 2023-11-06 DIAGNOSIS — E1042 Type 1 diabetes mellitus with diabetic polyneuropathy: Secondary | ICD-10-CM | POA: Diagnosis present

## 2023-11-06 DIAGNOSIS — E10641 Type 1 diabetes mellitus with hypoglycemia with coma: Secondary | ICD-10-CM | POA: Diagnosis present

## 2023-11-06 DIAGNOSIS — F1010 Alcohol abuse, uncomplicated: Secondary | ICD-10-CM | POA: Diagnosis present

## 2023-11-06 DIAGNOSIS — Z9641 Presence of insulin pump (external) (internal): Secondary | ICD-10-CM | POA: Diagnosis present

## 2023-11-06 DIAGNOSIS — F17210 Nicotine dependence, cigarettes, uncomplicated: Secondary | ICD-10-CM | POA: Diagnosis present

## 2023-11-06 DIAGNOSIS — Z794 Long term (current) use of insulin: Secondary | ICD-10-CM

## 2023-11-06 DIAGNOSIS — Z555 Less than a high school diploma: Secondary | ICD-10-CM | POA: Diagnosis not present

## 2023-11-06 DIAGNOSIS — Z79899 Other long term (current) drug therapy: Secondary | ICD-10-CM

## 2023-11-06 DIAGNOSIS — F1910 Other psychoactive substance abuse, uncomplicated: Secondary | ICD-10-CM | POA: Diagnosis present

## 2023-11-06 DIAGNOSIS — E162 Hypoglycemia, unspecified: Secondary | ICD-10-CM | POA: Diagnosis not present

## 2023-11-06 DIAGNOSIS — E1069 Type 1 diabetes mellitus with other specified complication: Secondary | ICD-10-CM | POA: Diagnosis present

## 2023-11-06 DIAGNOSIS — E785 Hyperlipidemia, unspecified: Secondary | ICD-10-CM | POA: Diagnosis present

## 2023-11-06 DIAGNOSIS — T1491XA Suicide attempt, initial encounter: Principal | ICD-10-CM | POA: Diagnosis present

## 2023-11-06 DIAGNOSIS — R45850 Homicidal ideations: Secondary | ICD-10-CM | POA: Diagnosis present

## 2023-11-06 DIAGNOSIS — I152 Hypertension secondary to endocrine disorders: Secondary | ICD-10-CM | POA: Diagnosis present

## 2023-11-06 DIAGNOSIS — Z79890 Hormone replacement therapy (postmenopausal): Secondary | ICD-10-CM | POA: Diagnosis not present

## 2023-11-06 DIAGNOSIS — Z604 Social exclusion and rejection: Secondary | ICD-10-CM | POA: Diagnosis present

## 2023-11-06 DIAGNOSIS — Z803 Family history of malignant neoplasm of breast: Secondary | ICD-10-CM

## 2023-11-06 DIAGNOSIS — F410 Panic disorder [episodic paroxysmal anxiety] without agoraphobia: Secondary | ICD-10-CM | POA: Diagnosis present

## 2023-11-06 DIAGNOSIS — F900 Attention-deficit hyperactivity disorder, predominantly inattentive type: Secondary | ICD-10-CM | POA: Diagnosis present

## 2023-11-06 DIAGNOSIS — F329 Major depressive disorder, single episode, unspecified: Secondary | ICD-10-CM | POA: Diagnosis present

## 2023-11-06 DIAGNOSIS — Z9152 Personal history of nonsuicidal self-harm: Secondary | ICD-10-CM | POA: Diagnosis not present

## 2023-11-06 DIAGNOSIS — E1059 Type 1 diabetes mellitus with other circulatory complications: Secondary | ICD-10-CM | POA: Diagnosis present

## 2023-11-06 DIAGNOSIS — Z808 Family history of malignant neoplasm of other organs or systems: Secondary | ICD-10-CM

## 2023-11-06 DIAGNOSIS — T383X2A Poisoning by insulin and oral hypoglycemic [antidiabetic] drugs, intentional self-harm, initial encounter: Principal | ICD-10-CM | POA: Diagnosis present

## 2023-11-06 LAB — GLUCOSE, CAPILLARY
Glucose-Capillary: 342 mg/dL — ABNORMAL HIGH (ref 70–99)
Glucose-Capillary: 258 mg/dL — ABNORMAL HIGH (ref 70–99)
Glucose-Capillary: 203 mg/dL — ABNORMAL HIGH (ref 70–99)
Glucose-Capillary: 175 mg/dL — ABNORMAL HIGH (ref 70–99)

## 2023-11-06 MED ORDER — MAGNESIUM HYDROXIDE 400 MG/5ML PO SUSP: 30.0000 mL | Freq: Every day | ORAL | Status: DC | PRN

## 2023-11-06 MED ORDER — INSULIN GLARGINE 100 UNIT/ML ~~LOC~~ SOLN
22.0000 [IU] | Freq: Every day | SUBCUTANEOUS | Status: DC
  Administered 2023-11-06: 22 [IU] via SUBCUTANEOUS
  Filled 2023-11-06: qty 0.22

## 2023-11-06 MED ORDER — THIAMINE HCL 100 MG/ML IJ SOLN
100.0000 mg | Freq: Every day | INTRAMUSCULAR | Status: DC
  Filled 2023-11-06: qty 2

## 2023-11-06 MED ORDER — THIAMINE MONONITRATE 100 MG PO TABS
100.0000 mg | ORAL_TABLET | Freq: Every day | ORAL | Status: DC
  Administered 2023-11-07 – 2023-11-12 (×6): 100 mg via ORAL
  Filled 2023-11-06 (×7): qty 1

## 2023-11-06 MED ORDER — HALOPERIDOL LACTATE 5 MG/ML IJ SOLN: 10.0000 mg | Freq: Three times a day (TID) | INTRAMUSCULAR | Status: DC | PRN

## 2023-11-06 MED ORDER — ATORVASTATIN CALCIUM 20 MG PO TABS
10.0000 mg | ORAL_TABLET | Freq: Every day | ORAL | Status: DC
  Administered 2023-11-07 – 2023-11-11 (×5): 10 mg via ORAL
  Filled 2023-11-06 (×5): qty 1

## 2023-11-06 MED ORDER — DIPHENHYDRAMINE HCL 25 MG PO CAPS: 50.0000 mg | ORAL_CAPSULE | Freq: Three times a day (TID) | ORAL | Status: DC | PRN

## 2023-11-06 MED ORDER — PANTOPRAZOLE SODIUM 40 MG PO TBEC
40.0000 mg | DELAYED_RELEASE_TABLET | Freq: Every day | ORAL | Status: DC
  Administered 2023-11-07 – 2023-11-12 (×6): 40 mg via ORAL
  Filled 2023-11-06 (×6): qty 1

## 2023-11-06 MED ORDER — ALUM & MAG HYDROXIDE-SIMETH 200-200-20 MG/5ML PO SUSP: 30.0000 mL | ORAL | Status: DC | PRN

## 2023-11-06 MED ORDER — INSULIN GLARGINE 100 UNIT/ML ~~LOC~~ SOLN: 22.0000 [IU] | Freq: Every day | SUBCUTANEOUS | Status: DC

## 2023-11-06 MED ORDER — DULOXETINE HCL 30 MG PO CPEP
60.0000 mg | ORAL_CAPSULE | Freq: Every day | ORAL | Status: DC
Start: 2023-11-07 — End: 2023-11-07
  Administered 2023-11-07: 60 mg via ORAL
  Filled 2023-11-06: qty 2

## 2023-11-06 MED ORDER — INSULIN GLARGINE 100 UNIT/ML ~~LOC~~ SOLN
22.0000 [IU] | Freq: Every day | SUBCUTANEOUS | Status: DC
  Filled 2023-11-06: qty 0.22

## 2023-11-06 MED ORDER — DIPHENHYDRAMINE HCL 50 MG/ML IJ SOLN: 50.0000 mg | Freq: Three times a day (TID) | INTRAMUSCULAR | Status: DC | PRN

## 2023-11-06 MED ORDER — HALOPERIDOL 5 MG PO TABS: 5.0000 mg | ORAL_TABLET | Freq: Three times a day (TID) | ORAL | Status: DC | PRN

## 2023-11-06 MED ORDER — HALOPERIDOL LACTATE 5 MG/ML IJ SOLN: 5.0000 mg | Freq: Three times a day (TID) | INTRAMUSCULAR | Status: DC | PRN

## 2023-11-06 MED ORDER — LORAZEPAM 2 MG/ML IJ SOLN: 2.0000 mg | Freq: Three times a day (TID) | INTRAMUSCULAR | Status: DC | PRN

## 2023-11-06 MED ORDER — FOLIC ACID 1 MG PO TABS
1.0000 mg | ORAL_TABLET | Freq: Every day | ORAL | Status: DC
  Administered 2023-11-07 – 2023-11-12 (×6): 1 mg via ORAL
  Filled 2023-11-06 (×6): qty 1

## 2023-11-06 MED ORDER — INSULIN ASPART 100 UNIT/ML IJ SOLN
0.0000 [IU] | Freq: Three times a day (TID) | INTRAMUSCULAR | Status: DC
  Administered 2023-11-07: 7 [IU] via SUBCUTANEOUS
  Administered 2023-11-07: 1 [IU] via SUBCUTANEOUS
  Administered 2023-11-08 (×2): 2 [IU] via SUBCUTANEOUS
  Administered 2023-11-08: 7 [IU] via SUBCUTANEOUS
  Administered 2023-11-09: 3 [IU] via SUBCUTANEOUS
  Administered 2023-11-09 (×2): 7 [IU] via SUBCUTANEOUS
  Administered 2023-11-10: 2 [IU] via SUBCUTANEOUS
  Administered 2023-11-11: 5 [IU] via SUBCUTANEOUS
  Administered 2023-11-11 – 2023-11-12 (×2): 7 [IU] via SUBCUTANEOUS
  Filled 2023-11-06 (×11): qty 1

## 2023-11-06 MED ORDER — OXYCODONE HCL 5 MG PO TABS
5.0000 mg | ORAL_TABLET | Freq: Three times a day (TID) | ORAL | Status: DC | PRN
  Administered 2023-11-07 – 2023-11-12 (×11): 5 mg via ORAL
  Filled 2023-11-06 (×12): qty 1

## 2023-11-06 MED ORDER — LEVOTHYROXINE SODIUM 50 MCG PO TABS
250.0000 ug | ORAL_TABLET | Freq: Every day | ORAL | Status: DC
  Administered 2023-11-07 – 2023-11-12 (×6): 250 ug via ORAL
  Filled 2023-11-06 (×7): qty 5

## 2023-11-06 MED ORDER — ADULT MULTIVITAMIN W/MINERALS CH
1.0000 | ORAL_TABLET | Freq: Every day | ORAL | Status: DC
  Administered 2023-11-07 – 2023-11-12 (×6): 1 via ORAL
  Filled 2023-11-06 (×6): qty 1

## 2023-11-06 MED ORDER — INSULIN ASPART 100 UNIT/ML IJ SOLN
0.0000 [IU] | Freq: Every day | INTRAMUSCULAR | Status: DC
  Administered 2023-11-07: 4 [IU] via SUBCUTANEOUS
  Administered 2023-11-08: 3 [IU] via SUBCUTANEOUS
  Administered 2023-11-10 – 2023-11-11 (×2): 4 [IU] via SUBCUTANEOUS
  Filled 2023-11-06 (×4): qty 1

## 2023-11-06 NOTE — Discharge Instructions (Addendum)
 Intensive Outpatient Programs   [facility] [address] [place],  [place], [place] [phone]  [facility] (Inpatient and outpatient)[phone] (Suboxone and Methadone) [address] [phone]  [facility]: [facility] Programs - Intensive Outpatient [address] [place], [place] [address]  [phone]  [facility] (Outpatient, Inpatient, [facility] (Groups and Residental) (insurance only) [phone] [place], [place] [phone]   [facility] (for caregivers and family) [address] [place] [address], [place], [place] [phone]  Residential Treatment Programs  [facility](2 years) Residential: 37 days)[facility] [address] [facility] [address] [place], [place], [place] [phone] or [phone]  [facility] [address] [place], [place], [place] [phone]  [facility] ([facility]) [address] [place], [place], [place] [phone] Admissions: 8am-3pm M-F  [facility] Program: Residential Program [phone] Days)             [facility]: [place]  [facility]  [place], [place], [place]  [phone] or [phone] in Hours over the weekend or by referral)  [facility] [address] [place], [place] [address] [phone] (Do virtual or phone assessment, offer transportation within 25 miles, have in patient and Outpatient options)   [facility] Crisis: [facility]:[phone] (for crisis  response 24 hours a day)     Agency Name: [facility] Agency Address: [address] [place], [place] [address] Phone: [phone] Website: www.[email or web address] Service(s) Offered: Housing services, self-sufficiency, congregate meal program, and individual development account program.  Agency Name: [employer] Address: [address], [place], [place] [address] Phone: [phone] Email: info@alliedchurches .org Website: [email or web address].[email or web address] Service(s) Offered: Housing the homeless, feeding the hungry, Company secretary, job and education related services.  Agency Name: [facility] Address: [address], [place], [place] [address] Phone: [phone] Email: [email or web address] Service(s) Offered: Counseling, problem pregnancy, advocacy for Hispanics, limited emergency financial assistance.  Agency Name: Department [employer] Address: [address] [place], [place] [address] Phone: [phone] Website: [email or web address] Service(s) Offered: Child support services; child welfare services; SNAP; Medicaid; work first family assistance; and aid with fuel,  rent, food and medicine.  Agency Name: [employer] representative Address: [address] [place], [place] [address] Phone: [phone] or [phone] Email: [email or web address] Service(s) Offered: Family services and transient assistance; emergency food, fuel, clothing, limited furniture, utilities; budget counseling, general counseling; give a kid a coat; thrift store; Christmas food and toys. Utility assistance, food pantry, rental  assistance, life sustaining medicine  Do you feel isolated?  [facility] [employer] [facility] offers a [employer] that anyone can call toll free at [phone]. The friendship line is available 24 hours a day  [employer] is a Program of All-inclusive Care for the Elderly  (PACE). Their mission is to promote and sustain the independence of seniors wishing to remain in the community. They provide seniors with comprehensive long-term health, social, medical and dietary care. Their program is a safe alternative to nursing home care. [phone]  [facility] Address [facility] [address] [place], [place] [address] Phone: [phone]. . Online zoom yoga class, connect with others without leaving your home [facility] offers [place] dance cardio sessions for individuals via Zoom. This  program provides: - Dance fitness activities Please contact program for more information. Servinganyone in need adults 18+ hiv/aids individuals families Call [phone]  Email [email or web address] [email or web address] to get more info  [employer] offers an online [employer] to individuals where they can receive help to focus on their best health. Whether you're a [employer] member or not, the neighborhood center offers a... Main Serviceshealth education  exercise & fitness  community support services  recreation  virtual support Other Servicessupport groups Servinganyone in need adults young adults teens seniors individuals families [email or web address] to get more info  Schedule on their website  [facility] offers an array of activities for adults age 53 and over. This program provides:- Fitness and health programs- Tech classes- Activity books Main Serviceshealth education  community support services  exercise & fitness  recreation  more education Servingseniors  Call [phone]    For more resources go online to [email or web address].[email or web address].uk and type in you zipcode

## 2023-11-06 NOTE — Progress Notes (Signed)
 Patient up most of the evening. Patient states he has a hard time sleeping. Patient educated on stopping substance abuse and help groups. Spoke a lot about his dogs which makes him happy.  Encouraged a health lifestyle.

## 2023-11-06 NOTE — Progress Notes (Signed)
 Pt was admitted to [facility] via [facility].  Pt denied SI HI AVH or ever having had any of these.  Pt arrived from the medical unit with a saline lock still in his right anterior forearm, which was removed by [facility] nursing staff.  Pt denied that he had overdosed on insulin  and stated I was just drunk and my glucose was low.  Pt denied use of alcohol except occasionally and also stated I was just drunk that night.  Pt refused to sign paperwork related to unit procedures and expectations stating I'm not signing anything else.  Pt states that he should not be on the MH unit and that he does not have any mental health concerns.  Pt had brought food from the medical unit and stated I'm not interested in eating that, I'm just going to sleep.  Pt was oriented to his room and the unit and went to bed immediately after being admitted.    11/06/23 2100  Psych Admission Type (Psych Patients Only)  Admission Status Involuntary  Psychosocial Assessment  Patient Complaints None  Eye Contact Fair  Facial Expression Anxious  Affect Anxious  Speech Argumentative  Interaction Arrogant  Motor Activity Fidgety  Appearance/Hygiene In scrubs  Behavior Characteristics Anxious;Unwilling to participate  Mood Angry;Apathetic;Anxious  Thought Process  Coherency WDL  Content Preoccupation  Delusions None reported or observed  Perception WDL  Hallucination None reported or observed  Judgment Poor  Confusion None  Danger to Self  Current suicidal ideation? Denies  Danger to Others  Danger to Others None reported or observed

## 2023-11-06 NOTE — Plan of Care (Signed)
  Problem: Education: Goal: Knowledge of General Education information will improve Description: Including pain rating scale, medication(s)/side effects and non-pharmacologic comfort measures Outcome: Progressing   Problem: Health Behavior/Discharge Planning: Goal: Ability to manage health-related needs will improve Outcome: Progressing   Problem: Clinical Measurements: Goal: Ability to maintain clinical measurements within normal limits will improve Outcome: Progressing   Problem: Clinical Measurements: Goal: Respiratory complications will improve Outcome: Progressing   Problem: Clinical Measurements: Goal: Cardiovascular complication will be avoided Outcome: Progressing   Problem: Activity: Goal: Risk for activity intolerance will decrease Outcome: Progressing   

## 2023-11-06 NOTE — Discharge Summary (Signed)
 Physician Discharge Summary   Patient: Ronald Banks MRN: [id number] DOB: 1975-11-18  Admit date:     10/31/2023  Discharge date: 11/06/23  Discharge Physician: AIDA CHO   PCP: Vicci Barnie NOVAK, MD   Recommendations at discharge:   Follow-up with psychiatrist at the behavioral health unit Recommend regular follow-up with PCP for routine health maintenance  Discharge Diagnoses: Principal Problem:   Hypoglycemia Active Problems:   ETOH abuse   Polysubstance abuse (HCC)   Suicide attempt (HCC)   Type 1 diabetes mellitus with diabetic polyneuropathy   Essential hypertension   Hypothyroidism   Tobacco dependence   Suicidal ideation  Resolved Problems:   * No resolved hospital problems. *  Hospital Course:  Ronald Banks is a 48 y.o. male  with medical history significant for type 1 diabetes on insulin  pump, HTN, HLD, hypothyroidism, depression, polysubstance use disorder (including alcohol, tobacco, amphetamines, cannabinoids), who was brought to the emergency department because of unresponsiveness in a private vehicle.  Reportedly, patient had been out drinking with his friend and complained of not feeling well so requested to be taken home.  However, on the way home, patient apparently became unresponsive in the vehicle so his friend drove him to the emergency room instead.   In the ED, patient was hypoglycemic with glucose of 35.  Ethanol level was 112.  UDS positive for cocaine, amphetamine, cannabinoids.  He was treated with IV 50% dextrose  followed by 10% dextrose  maintenance fluids because of recurrent hypoglycemia.  Patient later reported that he had intentionally taking excess dose of insulin  and attempt to kill himself.  Psychiatrist was consulted and patient was placed under involuntary commitment.       Assessment and Plan:  Recurrent hypoglycemia: Glucose down from 342-258.  Glucose levels appear to have stabilized. Lantus increased from 20 to 22 units  daily.  Continue NovoLog  as needed per sliding scale. Monitor glucose levels closely and adjust insulin  as needed. Please consult diabetic coordinator to behavioral health unit to assist with diabetes management. He had an insulin  pump prior to admission but this has been discontinued.     Acute metabolic encephalopathy: Resolved He had previously threatened to walk out of the hospital but he appears calm now.     Depression with suicidal attempt: Patient reported taking extra dose of insulin  in an attempt to kill himself.  Currently, he denies any suicidal or homicidal ideation.  He remains under involuntary commitment.   Patient will be discharged to behavioral health unit for further management.     Polysubstance use disorder (alcohol, tobacco, cocaine, methamphetamine, cannabinoids) counseled to quit. Tylenol  as needed for pain. Patient encouraged to go to the pain clinic for chronic pain management   Hypothyroidism: TSH was 61.  Continue Synthroid .  Patient had not been taking Synthroid  at home.     Comorbidities include hypertension, medical nonadherence, diabetic polyneuropathy, chronic back pain.   He is medically stable for discharge today behavioral health unit.  Case discussed with behavioral health unit team.       Consultants: Psychiatrist Procedures performed: None Disposition: Behavioral health unit Diet recommendation:  Discharge Diet Orders (From admission, onward)     Start     Ordered   11/05/23 0000  Diet - low sodium heart healthy        11/05/23 0948   11/05/23 0000  Diet Carb Modified        11/05/23 0948           Cardiac and  Carb modified diet DISCHARGE MEDICATION: Allergies as of 11/06/2023   No Known Allergies      Medication List     STOP taking these medications    fluticasone  50 MCG/ACT nasal spray Commonly known as: FLONASE    gabapentin  600 MG tablet Commonly known as: Neurontin    insulin  pump Soln   lidocaine  5  % Commonly known as: Lidoderm    meloxicam  15 MG tablet Commonly known as: MOBIC        TAKE these medications    acetaminophen  325 MG tablet Commonly known as: TYLENOL  Take 2 tablets (650 mg total) by mouth every 6 (six) hours as needed for mild pain (pain score 1-3) or fever (or Fever >/= 101).   atorvastatin  10 MG tablet Commonly known as: LIPITOR Take 10 mg by mouth daily.   DULoxetine  60 MG capsule Commonly known as: CYMBALTA  Take 60 mg by mouth daily.   insulin  aspart 100 UNIT/ML injection Commonly known as: novoLOG  Inject 0-5 Units into the skin at bedtime. What changed: See the new instructions.   insulin  aspart 100 UNIT/ML injection Commonly known as: novoLOG  Inject 0-9 Units into the skin 3 (three) times daily with meals. What changed: You were already taking a medication with the same name, and this prescription was added. Make sure you understand how and when to take each.   insulin  glargine 100 UNIT/ML injection Commonly known as: LANTUS Inject 0.22 mLs (22 Units total) into the skin daily.   levothyroxine  125 MCG tablet Commonly known as: SYNTHROID  Take 2 tablets by mouth daily.        Discharge Exam: Filed Weights   10/31/23 0334 10/31/23 0730 10/31/23 0842  Weight: 89 kg 89 kg 85.3 kg   GEN: NAD SKIN: Warm and dry EYES: No pallor or icterus ENT: MMM CV: RRR PULM: CTA B ABD: soft, ND, NT, +BS CNS: AAO x 3, non focal EXT: No edema or tenderness PSYCH: Calm and cooperative at the time of my visit.  No delusions or hallucinations.  Not suicidal   Condition at discharge: stable  The results of significant diagnostics from this hospitalization (including imaging, microbiology, ancillary and laboratory) are listed below for reference.   Imaging Studies: DG Chest Portable 1 View Result Date: 10/31/2023 CLINICAL DATA:  Altered mental status unresponsive EXAM: PORTABLE CHEST 1 VIEW COMPARISON:  11/25/2022 FINDINGS: The heart size and mediastinal  contours are within normal limits. Both lungs are clear. The visualized skeletal structures are unremarkable. IMPRESSION: No acute abnormality of the lungs in AP portable projection. Electronically Signed   By: Marolyn JONETTA Jaksch M.D.   On: 10/31/2023 05:37    Microbiology: Results for orders placed or performed during the hospital encounter of 10/31/23  Blood culture (routine x 2)     Status: None   Collection Time: 10/31/23  4:03 AM   Specimen: BLOOD LEFT HAND  Result Value Ref Range Status   Specimen Description BLOOD LEFT HAND  Final   Special Requests   Final    BOTTLES DRAWN AEROBIC AND ANAEROBIC Blood Culture results may not be optimal due to an inadequate volume of blood received in culture bottles   Culture   Final    NO GROWTH 5 DAYS Performed at [facility], [address] [place], [place] [address]    Report Status 11/05/2023 FINAL  Final  Blood culture (routine x 2)     Status: None   Collection Time: 10/31/23  4:03 AM   Specimen: BLOOD  Result Value Ref Range  Status   Specimen Description BLOOD LEFT AC  Final   Special Requests   Final    BOTTLES DRAWN AEROBIC AND ANAEROBIC Blood Culture adequate volume   Culture   Final    NO GROWTH 5 DAYS Performed at [facility], [address], [place], [place] [address]    Report Status 11/05/2023 FINAL  Final  MRSA Next Gen by PCR, Nasal     Status: None   Collection Time: 10/31/23  8:45 AM   Specimen: Nasal Mucosa; Nasal Swab  Result Value Ref Range Status   MRSA by PCR Next Gen NOT DETECTED NOT DETECTED Final    Comment: (NOTE) The GeneXpert MRSA Assay (FDA approved for NASAL specimens only), is one component of a comprehensive MRSA colonization surveillance program. It is not intended to diagnose MRSA infection nor to guide or monitor treatment for MRSA infections. Test performance is not FDA approved in patients less than 59 years old. Performed at [facility], [address]  [place], [place] [address]     Labs: CBC: Recent Labs  Lab 10/31/23 0402 11/01/23 0515  WBC 6.5 7.3  NEUTROABS 4.3  --   HGB 13.3 12.6*  HCT 40.1 38.8*  MCV 94.4 94.9  PLT 252 230   Basic Metabolic Panel: Recent Labs  Lab 10/31/23 0402 10/31/23 1051 11/01/23 0515  NA 140  --  136  K 3.0* 4.5 4.1  CL 105  --  109  CO2 24  --  24  GLUCOSE 160*  --  364*  BUN 8  --  11  CREATININE 0.99  --  1.13  CALCIUM  8.7*  --  7.7*  MG  --   --  2.0   Liver Function Tests: Recent Labs  Lab 11/01/23 0515  AST 14*  ALT 9  ALKPHOS 36*  BILITOT 0.9  PROT 4.8*  ALBUMIN 2.7*   CBG: Recent Labs  Lab 11/05/23 1157 11/05/23 1652 11/05/23 2113 11/06/23 0800 11/06/23 1156  GLUCAP 393* 188* 239* 342* 258*    Discharge time spent: greater than 30 minutes.  Signed: AIDA CHO, MD [facility] 11/06/2023

## 2023-11-06 NOTE — TOC CM/SW Note (Signed)
 Transition of Care [facility]) - Inpatient Brief Assessment   Patient Details  Name: Ronald Banks MRN: [id number] Date of Birth: 1975-07-25  Transition of Care [facility]) CM/SW Contact:    Corean ONEIDA Haddock, RN Phone Number: 11/06/2023, 8:56 AM   Clinical Narrative:  Transition of Care [facility]) Screening Note   Patient Details  Name: Ronald Banks Date of Birth: 07/11/1975   Transition of Care [facility]) CM/SW Contact:    Corean ONEIDA Haddock, RN Phone Number: 11/06/2023, 8:56 AM    Transition of Care Department [facility]) has reviewed patient and no TOC needs have been identified at this time.  If new patient transition needs arise, please place a TOC consult.  Substance abuse resources added to AVS Per SDOH utility and social isolation resources added to AVS Patient currently IVC and psych recommending inpatient psych      Transition of Care Asessment: Insurance and Status: Insurance coverage has been reviewed Patient has primary care physician: Yes     Prior/Current Home Services: No current home services Social Drivers of Health Review: SDOH reviewed interventions complete Readmission risk has been reviewed: Yes Transition of care needs: no transition of care needs at this time

## 2023-11-06 NOTE — Plan of Care (Signed)

## 2023-11-06 NOTE — Group Note (Signed)
 Date:  11/06/2023 Time:  9:11 PM  Group Topic/Focus:  Wrap-Up Group:   The focus of this group is to help patients review their daily goal of treatment and discuss progress on daily workbooks.    Participation Level:  Did Not Attend     Arlester CHRISTELLA Servant 11/06/2023, 9:11 PM

## 2023-11-06 NOTE — Inpatient Diabetes Management (Signed)
 Inpatient Diabetes Program Recommendations  AACE/ADA: New Consensus Statement on Inpatient Glycemic Control (2015)  Target Ranges:  Prepandial:   less than 140 mg/dL      Peak postprandial:   less than 180 mg/dL (1-2 hours)      Critically ill patients:  140 - 180 mg/dL   Lab Results  Component Value Date   GLUCAP 342 (H) 11/06/2023   HGBA1C 9.8 (H) 10/31/2023    Review of Glycemic Control  Latest Reference Range & Units 11/04/23 20:51 11/05/23 08:21 11/05/23 11:57 11/05/23 16:52 11/05/23 21:13 11/06/23 08:00  Glucose-Capillary 70 - 99 mg/dL 727 (H) 728 (H) 606 (H) 188 (H) 239 (H) 342 (H)  Diabetes history: DM 1 Outpatient Diabetes medications: T-Slim insulin  pump with Novolog  (total basal 34.3 units/24 hours; I:CR 1:8 grams; I:SF 1:40 mg/dl), Dexcom G7 Current orders for Inpatient glycemic control: Novolog  0-9 units tid with meals and HS Lantus 22 units daily  Inpatient Diabetes Program Recommendations:    It appears that patient is likely eating during the night hours.  CBG this morning>300 mg/dL. Agree with increase  of Lantus.  Consider adding low dose meal coverage 2 units tid with meals (hold if patient eats less than 50% or NPO).  Also consider reducing Novolog  correction to very sensitive (0-6 units) but increase frequency to q 4 hours.   Thanks,  Randall Bullocks, RN, BC-ADM Inpatient Diabetes Coordinator Pager [phone]  (8a-5p)

## 2023-11-07 DIAGNOSIS — F4323 Adjustment disorder with mixed anxiety and depressed mood: Secondary | ICD-10-CM | POA: Insufficient documentation

## 2023-11-07 DIAGNOSIS — T1491XA Suicide attempt, initial encounter: Secondary | ICD-10-CM | POA: Diagnosis not present

## 2023-11-07 LAB — GLUCOSE, CAPILLARY
Glucose-Capillary: 50 mg/dL — ABNORMAL LOW (ref 70–99)
Glucose-Capillary: 188 mg/dL — ABNORMAL HIGH (ref 70–99)
Glucose-Capillary: 305 mg/dL — ABNORMAL HIGH (ref 70–99)
Glucose-Capillary: 133 mg/dL — ABNORMAL HIGH (ref 70–99)
Glucose-Capillary: 346 mg/dL — ABNORMAL HIGH (ref 70–99)

## 2023-11-07 MED ORDER — DULOXETINE HCL 30 MG PO CPEP
90.0000 mg | ORAL_CAPSULE | Freq: Every day | ORAL | Status: DC
  Administered 2023-11-08 – 2023-11-12 (×5): 90 mg via ORAL
  Filled 2023-11-07 (×5): qty 3

## 2023-11-07 MED ORDER — INSULIN GLARGINE 100 UNIT/ML ~~LOC~~ SOLN
18.0000 [IU] | Freq: Every day | SUBCUTANEOUS | Status: DC
  Administered 2023-11-07 – 2023-11-09 (×3): 18 [IU] via SUBCUTANEOUS
  Filled 2023-11-07 (×3): qty 0.18

## 2023-11-07 MED ORDER — INSULIN ASPART 100 UNIT/ML IJ SOLN
3.0000 [IU] | Freq: Three times a day (TID) | INTRAMUSCULAR | Status: DC
  Administered 2023-11-07 – 2023-11-08 (×4): 3 [IU] via SUBCUTANEOUS
  Filled 2023-11-07 (×2): qty 1

## 2023-11-07 NOTE — BH IP Treatment Plan (Signed)
 Interdisciplinary Treatment and Diagnostic Plan Update  11/07/2023 Time of Session: 10:00AM Ronald Banks MRN: [id number]  Principal Diagnosis: Suicide attempt [facility])  Secondary Diagnoses: Principal Problem:   Suicide attempt [facility])   Current Medications:  Current Facility-Administered Medications  Medication Dose Route Frequency Provider Last Rate Last Admin   alum & mag hydroxide-simeth (MAALOX/MYLANTA) 200-200-20 MG/5ML suspension 30 mL  30 mL Oral Q4H PRN Smith, Annie B, NP       atorvastatin  (LIPITOR) tablet 10 mg  10 mg Oral Daily Smith, Annie B, NP       haloperidol (HALDOL) tablet 5 mg  5 mg Oral TID PRN Smith, Annie B, NP       And   diphenhydrAMINE  (BENADRYL ) capsule 50 mg  50 mg Oral TID PRN Smith, Annie B, NP       haloperidol lactate (HALDOL) injection 5 mg  5 mg Intramuscular TID PRN Smith, Annie B, NP       And   diphenhydrAMINE  (BENADRYL ) injection 50 mg  50 mg Intramuscular TID PRN Smith, Annie B, NP       And   LORazepam (ATIVAN) injection 2 mg  2 mg Intramuscular TID PRN Smith, Annie B, NP       haloperidol lactate (HALDOL) injection 10 mg  10 mg Intramuscular TID PRN Smith, Annie B, NP       And   diphenhydrAMINE  (BENADRYL ) injection 50 mg  50 mg Intramuscular TID PRN Smith, Annie B, NP       And   LORazepam (ATIVAN) injection 2 mg  2 mg Intramuscular TID PRN Smith, Annie B, NP       DULoxetine  (CYMBALTA ) DR capsule 60 mg  60 mg Oral Daily Smith, Annie B, NP   60 mg at 11/07/23 0840   folic acid (FOLVITE) tablet 1 mg  1 mg Oral Daily Smith, Annie B, NP   1 mg at 11/07/23 9157   insulin  aspart (novoLOG ) injection 0-5 Units  0-5 Units Subcutaneous QHS Smith, Annie B, NP       insulin  aspart (novoLOG ) injection 0-9 Units  0-9 Units Subcutaneous TID WC Smith, Annie B, NP   7 Units at 11/07/23 1206   insulin  aspart (novoLOG ) injection 3 Units  3 Units Subcutaneous TID WC Hunter, Crystal L, PA-C   3 Units at 11/07/23 1203   insulin  glargine (LANTUS) injection 18 Units   18 Units Subcutaneous Daily Hunter, Crystal L, PA-C   18 Units at 11/07/23 1034   levothyroxine  (SYNTHROID ) tablet 250 mcg  250 mcg Oral Daily Smith, Annie B, NP   250 mcg at 11/07/23 0841   magnesium hydroxide (MILK OF MAGNESIA) suspension 30 mL  30 mL Oral Daily PRN Smith, Annie B, NP       multivitamin with minerals tablet 1 tablet  1 tablet Oral Daily Smith, Annie B, NP   1 tablet at 11/07/23 9157   oxyCODONE  (Oxy IR/ROXICODONE ) immediate release tablet 5 mg  5 mg Oral Q8H PRN Smith, Annie B, NP   5 mg at 11/07/23 0846   pantoprazole  (PROTONIX ) EC tablet 40 mg  40 mg Oral Daily Smith, Annie B, NP   40 mg at 11/07/23 0842   thiamine (VITAMIN B1) tablet 100 mg  100 mg Oral Daily Smith, Annie B, NP   100 mg at 11/07/23 9158   Or   thiamine (VITAMIN B1) injection 100 mg  100 mg Intravenous Daily Smith, Annie B, NP       PTA Medications:  Medications Prior to Admission  Medication Sig Dispense Refill Last Dose/Taking   acetaminophen  (TYLENOL ) 325 MG tablet Take 2 tablets (650 mg total) by mouth every 6 (six) hours as needed for mild pain (pain score 1-3) or fever (or Fever >/= 101).      atorvastatin  (LIPITOR) 10 MG tablet Take 10 mg by mouth daily.      DULoxetine  (CYMBALTA ) 60 MG capsule Take 60 mg by mouth daily.      insulin  aspart (NOVOLOG ) 100 UNIT/ML injection Inject 0-5 Units into the skin at bedtime.      insulin  aspart (NOVOLOG ) 100 UNIT/ML injection Inject 0-9 Units into the skin 3 (three) times daily with meals.      insulin  glargine (LANTUS) 100 UNIT/ML injection Inject 0.22 mLs (22 Units total) into the skin daily.      levothyroxine  (SYNTHROID ) 125 MCG tablet Take 2 tablets by mouth daily.       Patient Stressors:    Patient Strengths:    Treatment Modalities: Medication Management, Group therapy, Case management,  1 to 1 session with clinician, Psychoeducation, Recreational therapy.   Physician Treatment Plan for Primary Diagnosis: Suicide attempt [facility]) Long Term Goal(s):      Short Term Goals:    Medication Management: Evaluate patient's response, side effects, and tolerance of medication regimen.  Therapeutic Interventions: 1 to 1 sessions, Unit Group sessions and Medication administration.  Evaluation of Outcomes: Not Met  Physician Treatment Plan for Secondary Diagnosis: Principal Problem:   Suicide attempt [facility])  Long Term Goal(s):     Short Term Goals:       Medication Management: Evaluate patient's response, side effects, and tolerance of medication regimen.  Therapeutic Interventions: 1 to 1 sessions, Unit Group sessions and Medication administration.  Evaluation of Outcomes: Not Met   RN Treatment Plan for Primary Diagnosis: Suicide attempt [facility]) Long Term Goal(s): Knowledge of disease and therapeutic regimen to maintain health will improve  Short Term Goals: Ability to demonstrate self-control, Ability to participate in decision making will improve, Ability to verbalize feelings will improve, Ability to disclose and discuss suicidal ideas, Ability to identify and develop effective coping behaviors will improve, and Compliance with prescribed medications will improve  Medication Management: RN will administer medications as ordered by provider, will assess and evaluate patient's response and provide education to patient for prescribed medication. RN will report any adverse and/or side effects to prescribing provider.  Therapeutic Interventions: 1 on 1 counseling sessions, Psychoeducation, Medication administration, Evaluate responses to treatment, Monitor vital signs and CBGs as ordered, Perform/monitor CIWA, COWS, AIMS and Fall Risk screenings as ordered, Perform wound care treatments as ordered.  Evaluation of Outcomes: Not Met   LCSW Treatment Plan for Primary Diagnosis: Suicide attempt [facility]) Long Term Goal(s): Safe transition to appropriate next level of care at discharge, Engage patient in therapeutic group addressing interpersonal  concerns.  Short Term Goals: Engage patient in aftercare planning with referrals and resources, Increase social support, Increase ability to appropriately verbalize feelings, Increase emotional regulation, Facilitate acceptance of mental health diagnosis and concerns, Facilitate patient progression through stages of change regarding substance use diagnoses and concerns, Identify triggers associated with mental health/substance abuse issues, and Increase skills for wellness and recovery  Therapeutic Interventions: Assess for all discharge needs, 1 to 1 time with Social worker, Explore available resources and support systems, Assess for adequacy in community support network, Educate family and significant other(s) on suicide prevention, Complete Psychosocial Assessment, Interpersonal group therapy.  Evaluation of Outcomes: Not Met  Progress in Treatment: Attending groups: No. Participating in groups: No. Taking medication as prescribed: Yes. Toleration medication: Yes. Family/Significant other contact made: No, will contact:  once permission has been granted Patient understands diagnosis: Yes. Discussing patient identified problems/goals with staff: Yes. Medical problems stabilized or resolved: Yes. Denies suicidal/homicidal ideation: Yes. Issues/concerns per patient self-inventory: No. Other: none  New problem(s) identified: No, Describe:  none  New Short Term/Long Term Goal(s): detox, elimination of symptoms of psychosis, medication management for mood stabilization; elimination of SI thoughts; development of comprehensive mental wellness/sobriety plan.   Patient Goals:  Patient declined to attend treatment at this time.   Discharge Plan or Barriers: CSW to assist the patient in development of appropriate discharge plans.    Reason for Continuation of Hospitalization: Anxiety Depression Medication stabilization Suicidal ideation  Estimated Length of Stay:  1-7 days  Last 3  [place] Suicide Severity Risk Score: Flowsheet Row Admission (Current) from 11/06/2023 in [facility] INPATIENT [facility]AL [facility]ICINE ED to Hosp-Admission (Discharged) from 10/31/2023 [facility] from 11/25/2022 in [facility] Department at [facility]  C-SSRS RISK CATEGORY No Risk High Risk No Risk    Last PHQ 2/9 Scores:    05/02/2023    3:56 PM 03/07/2021    3:18 PM 09/03/2020   10:07 AM  Depression screen PHQ 2/9  Decreased Interest 2 2 1   Down, Depressed, Hopeless 2 2 1   PHQ - 2 Score 4 4 2   Altered sleeping 1 2 0  Tired, decreased energy 3 1 2   Change in appetite 0 2 1  Feeling bad or failure about yourself  0 2 1  Trouble concentrating 3 3 3   Moving slowly or fidgety/restless 0 0 0  Suicidal thoughts 0 0 0  PHQ-9 Score 11 14 9   Difficult doing work/chores Somewhat difficult      Scribe for Treatment Team: Sherryle JINNY Margo, LCSW 11/07/2023 12:11 PM

## 2023-11-07 NOTE — H&P (Signed)
 Psychiatric Admission Assessment Adult  Patient Identification: Ronald Banks MRN:  [id number] Date of Evaluation:  11/07/2023 Chief Complaint:  Suicide attempt [facility]) [[id number]]   History of Present Illness: Ronald Banks is a pleasant 48 y.o. male with medical history significant for type 1 diabetes on insulin  pump, HTN, HLD, hypothyroidism, depression, tobacco dependence, polysubstance abuse who presented to ED today via private vehicle for acute change in mental status/unresponsiveness in the vehicle.  Patient was driven by a friend who was with the patient throughout the night.  Patient was  in his normal state until last night.  Patient and his friend were drinking out and did not feel well and wanted to be taken home.  While the friend was driving him home he became unresponsive and the friend drove him to the emergency room at [facility]. He did endorse intentionally taking insulin  prior to his arrival to the emergency department in an attempt to kill self. He reported he did not tell his friend what he did and that his friend drove him to the emergency department because he became unresponsive. He endorsed homicidal ideations but would not elaborate on who or if he had a plan. He did report the homicidal ideations were due to a certain situation Patient is admitted to adult psych unit with Q15 min safety monitoring. Multidisciplinary team approach is offered. Medication management; group/milieu therapy is offered.   On interview patient is noted to be resting in his room.  He minimizes the hospitalization and remained superficial throughout the interview.  He is noted to be laughing and making statements of why he is in the hospital.  When provider tried to discuss the hypoglycemia in the context of overdose on insulin  he declines being it as a overdose.  He reports he always struggled with high and low blood sugars.  Provider also discussed his threats towards his family which was informed to the  team.  Patient declines any of those statements.  Patient denies feeling depressed or anxious but did acknowledge the need for an antidepressant to help him cope with the chronic medical illness.  He reports that they took away the driver's license from him due to his erratic blood sugars as he ended up with 4 legal charges driving.  Patient denies SI/HI/plan.  Patient denies auditory/visual hallucinations.  Patient reports anxiety and panic attacks.  Patient denies any nightmares or flashbacks.  Patient denies recent or previous episodes of mania/hypomania.  He reports social drinking and reports intermittent use of marijuana.  Total Time spent with patient: 1 hour Sleep  Sleep:Sleep: Fair  Past Psychiatric History:  Psychiatric History:  Information collected from patient/chart  Prev Dx/Sx: Polysubstance abuse, depression Current Psych Provider: None Home Meds (current): None Previous Med Trials: Unsure Therapy: None   Prior Psych Hospitalization: Denied  Prior Self Harm: Yes Prior Violence: Denied   Family Psych History: Denied Family Hx suicide: Denied   Social History:     Occupational Hx: Unemployed Armed forces operational officer Hx: Upcoming court date Living Situation: Lives with mother  Access to weapons/lethal means: Denied access to firearms    Substance History Alcohol: Occasional   Type of alcohol Beer Last Drink Prior to arrival to ED   History of alcohol withdrawal seizures Denied History of DT's Denied Tobacco: Denied Illicit drugs: Denied- UDS positive for cocaine and amphetamines Prescription drug abuse: Denied Rehab hx: Denied Is the patient at risk to self? No.  Has the patient been a risk to self in the past 6  months? No.  Has the patient been a risk to self within the distant past? No.  Is the patient a risk to others? No.  Has the patient been a risk to others in the past 6 months? No.  Has the patient been a risk to others within the distant past? No.   [place]  Scale:  Flowsheet Row Admission (Current) from 11/06/2023 in [facility] INPATIENT BEHAVIORAL MEDICINE ED to Hosp-Admission (Discharged) from 10/31/2023 in [facility] ED from 11/25/2022 in [facility] Emergency Department at [facility]  C-SSRS RISK CATEGORY No Risk High Risk No Risk     Past Medical History:  Past Medical History:  Diagnosis Date   ADHD (attention deficit hyperactivity disorder), inattentive type    Chronic midline low back pain with right-sided sciatica 02/11/2018   Chronic right shoulder pain 02/11/2018   DKA (diabetic ketoacidosis) 08/17/2020   DOE (dyspnea on exertion) 05/19/2019   Onset 2015/16 05/19/2019   Walked RA x two laps =  approx 552ft @ brisk pace - stopped due to end of study with sats of 98 % at the end of the study and c/o sob s cp  > rec CPST next  - alpha one AT  Screen   05/19/19 MM  152  - CPST  06/30/19  FEV1 3.79 (93%) with ratio 0.73 and nl f/v loop: ex study  probabably  wnl when wt factored  In  - sob with ventilatory reserve suggested sub max effort and de   Hyperlipidemia due to type 1 diabetes mellitus 04/18/2018   Hypertension associated with diabetes 04/18/2018   Trial off acei 04/07/2019 due to cough > improved by 07/03/2019 with just a typical smoker's rattle (no longer cough to point of gag/vomit)   Hypothyroidism    Major depressive disorder 10/09/2019   Moderate nonproliferative diabetic retinopathy of both eyes (HCC) 05/19/2019   Tobacco dependence 10/09/2019   Type 1 diabetes mellitus with diabetic polyneuropathy 04/18/2018    Past Surgical History:  Procedure Laterality Date   DENTAL SURGERY     EAR CYST EXCISION Right 03/24/2020   Procedure: EXCISION PRE-AURICULAR CYST;  Surgeon: Milissa Hamming, MD;  Location: [facility];  Service: ENT;  Laterality: Right;  diabetic insulin  pump   PREAURICULAR CYST EXCISION Left 07/07/2020   Procedure: EXCISION PREAURICULAR CYST ADULT;  Surgeon: Milissa Hamming, MD;  Location: [facility];  Service: ENT;  Laterality: Left;   Family History:  Family History  Problem Relation Age of Onset   Diabetes Mother    Cancer Mother        Brain, breast    Diabetes Sister    Diabetes Maternal Grandmother     Social History:  Social History   Substance and Sexual Activity  Alcohol Use Yes     Social History   Substance and Sexual Activity  Drug Use Yes   Types: Marijuana   Comment: Several times per week      Allergies:  No Known Allergies Lab Results:  Results for orders placed or performed during the hospital encounter of 11/06/23 (from the past 48 hours)  Glucose, capillary     Status: Abnormal   Collection Time: 11/06/23  8:48 PM  Result Value Ref Range   Glucose-Capillary 175 (H) 70 - 99 mg/dL    Comment: Glucose reference range applies only to samples taken after fasting for at least 8 hours.  Glucose, capillary     Status: Abnormal   Collection Time: 11/07/23  7:36 AM  Result Value Ref Range   Glucose-Capillary 50 (L) 70 - 99 mg/dL    Comment: Glucose reference range applies only to samples taken after fasting for at least 8 hours.  Glucose, capillary     Status: Abnormal   Collection Time: 11/07/23  8:36 AM  Result Value Ref Range   Glucose-Capillary 188 (H) 70 - 99 mg/dL    Comment: Glucose reference range applies only to samples taken after fasting for at least 8 hours.  Glucose, capillary     Status: Abnormal   Collection Time: 11/07/23 11:16 AM  Result Value Ref Range   Glucose-Capillary 305 (H) 70 - 99 mg/dL    Comment: Glucose reference range applies only to samples taken after fasting for at least 8 hours.  Glucose, capillary     Status: Abnormal   Collection Time: 11/07/23  4:33 PM  Result Value Ref Range   Glucose-Capillary 133 (H) 70 - 99 mg/dL    Comment: Glucose reference range applies only to samples taken after fasting for at least 8 hours.    Blood Alcohol level:  Lab Results  Component  Value Date   ETH 112 (H) 10/31/2023   ETH <10 11/25/2022    Metabolic Disorder Labs:  Lab Results  Component Value Date   HGBA1C 9.8 (H) 10/31/2023   MPG 234.56 10/31/2023   MPG 148 08/17/2020   No results found for: PROLACTIN Lab Results  Component Value Date   CHOL 226 (H) 09/11/2018   TRIG 89 09/11/2018   HDL 65 09/11/2018   CHOLHDL 3.5 09/11/2018   LDLCALC 143 (H) 09/11/2018   LDLCALC 214 (H) 03/04/2018    Current Medications: Current Facility-Administered Medications  Medication Dose Route Frequency Provider Last Rate Last Admin   alum & mag hydroxide-simeth (MAALOX/MYLANTA) 200-200-20 MG/5ML suspension 30 mL  30 mL Oral Q4H PRN Smith, Annie B, NP       atorvastatin  (LIPITOR) tablet 10 mg  10 mg Oral Daily Smith, Annie B, NP       haloperidol (HALDOL) tablet 5 mg  5 mg Oral TID PRN Smith, Annie B, NP       And   diphenhydrAMINE  (BENADRYL ) capsule 50 mg  50 mg Oral TID PRN Smith, Annie B, NP       haloperidol lactate (HALDOL) injection 5 mg  5 mg Intramuscular TID PRN Smith, Annie B, NP       And   diphenhydrAMINE  (BENADRYL ) injection 50 mg  50 mg Intramuscular TID PRN Smith, Annie B, NP       And   LORazepam (ATIVAN) injection 2 mg  2 mg Intramuscular TID PRN Smith, Annie B, NP       haloperidol lactate (HALDOL) injection 10 mg  10 mg Intramuscular TID PRN Smith, Annie B, NP       And   diphenhydrAMINE  (BENADRYL ) injection 50 mg  50 mg Intramuscular TID PRN Smith, Annie B, NP       And   LORazepam (ATIVAN) injection 2 mg  2 mg Intramuscular TID PRN Smith, Annie B, NP       DULoxetine  (CYMBALTA ) DR capsule 60 mg  60 mg Oral Daily Smith, Annie B, NP   60 mg at 11/07/23 0840   folic acid (FOLVITE) tablet 1 mg  1 mg Oral Daily Smith, Annie B, NP   1 mg at 11/07/23 9157   insulin  aspart (novoLOG ) injection 0-5 Units  0-5 Units Subcutaneous QHS Smith, Annie B, NP  insulin  aspart (novoLOG ) injection 0-9 Units  0-9 Units Subcutaneous TID WC Smith, Annie B, NP   7 Units  at 11/07/23 1206   insulin  aspart (novoLOG ) injection 3 Units  3 Units Subcutaneous TID WC Hunter, Crystal L, PA-C   3 Units at 11/07/23 1203   insulin  glargine (LANTUS) injection 18 Units  18 Units Subcutaneous Daily Hunter, Crystal L, PA-C   18 Units at 11/07/23 1034   levothyroxine  (SYNTHROID ) tablet 250 mcg  250 mcg Oral Daily Smith, Annie B, NP   250 mcg at 11/07/23 0841   magnesium hydroxide (MILK OF MAGNESIA) suspension 30 mL  30 mL Oral Daily PRN Smith, Annie B, NP       multivitamin with minerals tablet 1 tablet  1 tablet Oral Daily Smith, Annie B, NP   1 tablet at 11/07/23 9157   oxyCODONE  (Oxy IR/ROXICODONE ) immediate release tablet 5 mg  5 mg Oral Q8H PRN Smith, Annie B, NP   5 mg at 11/07/23 0846   pantoprazole  (PROTONIX ) EC tablet 40 mg  40 mg Oral Daily Smith, Annie B, NP   40 mg at 11/07/23 0842   thiamine (VITAMIN B1) tablet 100 mg  100 mg Oral Daily Smith, Annie B, NP   100 mg at 11/07/23 9158   Or   thiamine (VITAMIN B1) injection 100 mg  100 mg Intravenous Daily Smith, Annie B, NP       PTA Medications: Medications Prior to Admission  Medication Sig Dispense Refill Last Dose/Taking   acetaminophen  (TYLENOL ) 325 MG tablet Take 2 tablets (650 mg total) by mouth every 6 (six) hours as needed for mild pain (pain score 1-3) or fever (or Fever >/= 101).      atorvastatin  (LIPITOR) 10 MG tablet Take 10 mg by mouth daily.      DULoxetine  (CYMBALTA ) 60 MG capsule Take 60 mg by mouth daily.      insulin  aspart (NOVOLOG ) 100 UNIT/ML injection Inject 0-5 Units into the skin at bedtime.      insulin  aspart (NOVOLOG ) 100 UNIT/ML injection Inject 0-9 Units into the skin 3 (three) times daily with meals.      insulin  glargine (LANTUS) 100 UNIT/ML injection Inject 0.22 mLs (22 Units total) into the skin daily.      levothyroxine  (SYNTHROID ) 125 MCG tablet Take 2 tablets by mouth daily.       Psychiatric Specialty Exam:  Presentation  General Appearance:  Casual  Eye  Contact: Fair  Speech: Clear and Coherent  Speech Volume: Normal    Mood and Affect  Mood: Euthymic  Affect: Appropriate   Thought Process  Thought Processes: Coherent  Descriptions of Associations:Intact  Orientation:Full (Time, Place and Person)  Thought Content:Illogical  Hallucinations:Hallucinations: None  Ideas of Reference:None  Suicidal Thoughts:Suicidal Thoughts: No  Homicidal Thoughts:Homicidal Thoughts: No   Sensorium  Memory: Immediate Fair; Recent Fair  Judgment: Impaired  Insight: Shallow   Executive Functions  Concentration: Fair  Attention Span: Fair  Recall: [employer] of Knowledge: Fair  Language: Fair   Psychomotor Activity  Psychomotor Activity: Psychomotor Activity: Normal   Assets  Assets: Communication Skills; Desire for Improvement; Physical Health; Resilience    Musculoskeletal: Strength & Muscle Tone: within normal limits Gait & Station: normal  Physical Exam: Physical Exam Vitals and nursing note reviewed.  HENT:     Head: Normocephalic.     Nose: Nose normal.  Cardiovascular:     Rate and Rhythm: Normal rate.  Pulmonary:     Effort:  Pulmonary effort is normal.  Neurological:     Mental Status: He is alert.    Review of Systems  Constitutional: Negative.   HENT: Negative.    Eyes: Negative.   Respiratory: Negative.    Cardiovascular: Negative.   Skin: Negative.    Blood pressure 130/78, pulse (!) 58, temperature 98 F (36.7 C), temperature source Oral, resp. rate 14, height 5' 10 (1.778 m), weight 79.8 kg, SpO2 97%. Body mass index is 25.25 kg/m.  Principal Diagnosis: adjustment disorder with depression,anxiety  Clinical Decision Making: Patient is currently admitted after insulin  overdose and critical hypoglycemia and he did acknowledge it as suicide attempt in ED.Since admission patient remains superficial and minimally engaged in treatment plan.  Treatment Plan  Summary:  Safety and Monitoring:             -- Voluntary admission to inpatient psychiatric unit for safety, stabilization and treatment             -- Daily contact with patient to assess and evaluate symptoms and progress in treatment             -- Patient's case to be discussed in multi-disciplinary team meeting             -- Observation Level: q15 minute checks             -- Vital signs:  q12 hours             -- Precautions: suicide, elopement, and assault   2. Psychiatric Diagnoses and Treatment:                cymbalta  increased to 90 mg   -- The risks/benefits/side-effects/alternatives to this medication were discussed in detail with the patient and time was given for questions. The patient consents to medication trial.                -- Metabolic profile and EKG monitoring obtained while on an atypical antipsychotic (BMI: Lipid Panel: HbgA1c: QTc:)              -- Encouraged patient to participate in unit milieu and in scheduled group therapies                            3. Medical Issues Being Addressed:      4. Discharge Planning:              -- Social work and case management to assist with discharge planning and identification of hospital follow-up needs prior to discharge             -- Estimated LOS: 5-7 days             -- Discharge Concerns: Need to establish a safety plan; Medication compliance and effectiveness             -- Discharge Goals: Return home with outpatient referrals follow ups  Physician Treatment Plan for Primary Diagnosis: Suicide attempt [facility]) Long Term Goal(s): Improvement in symptoms so as ready for discharge  Short Term Goals: Ability to identify changes in lifestyle to reduce recurrence of condition will improve, Ability to verbalize feelings will improve, Ability to disclose and discuss suicidal ideas, Ability to demonstrate self-control will improve, and Ability to identify and develop effective coping behaviors will improve  Physician  Treatment Plan for Secondary Diagnosis: Principal Problem:   Suicide attempt [facility])  Long Term Goal(s): Improvement in symptoms so as ready  for discharge  Short Term Goals: Ability to identify changes in lifestyle to reduce recurrence of condition will improve, Ability to verbalize feelings will improve, Ability to disclose and discuss suicidal ideas, Ability to demonstrate self-control will improve, and Ability to identify triggers associated with substance abuse/mental health issues will improve  I certify that inpatient services furnished can reasonably be expected to improve the patient's condition.    Rosey Eide, MD 10/15/20255:10 PM

## 2023-11-07 NOTE — BHH Counselor (Signed)
 Adult Comprehensive Assessment  Patient ID: Ronald Banks, male   DOB: 1975/10/31, 48 y.o.   MRN: [id number]  Information Source: Information source: Patient  Current Stressors:  Patient states their primary concerns and needs for treatment are:: I have no idea.  Low blood sugar. Patient states their goals for this hospitilization and ongoing recovery are:: I want to go home Educational / Learning stressors: Pt denies. Employment / Job issues: loss my job being here Family Relationships: Pt denies. Financial / Lack of resources (include bankruptcy): Pt denies. Housing / Lack of housing: Pt denies. Physical health (include injuries & life threatening diseases): diabetes and back pain Social relationships: Pt denies. Substance abuse: marijuana, alcohol Bereavement / Loss: Pt denies.  Living/Environment/Situation:  Living Arrangements: Parent Living conditions (as described by patient or guardian): WNL Who else lives in the home?: just my mom How long has patient lived in current situation?: all my life  Family History:  Marital status: Single Are you sexually active?: Yes What is your sexual orientation?: women Has your sexual activity been affected by drugs, alcohol, medication, or emotional stress?: Pt denies. Does patient have children?: No  Childhood History:  By whom was/is the patient raised?: Mother Description of patient's relationship with caregiver when they were a child: good Patient's description of current relationship with people who raised him/her: good How were you disciplined when you got in trouble as a child/adolescent?: I was punished, whoopings Does patient have siblings?: Yes Number of Siblings: 2 Description of patient's current relationship with siblings: good Did patient suffer any verbal/emotional/physical/sexual abuse as a child?: No Did patient suffer from severe childhood neglect?: No Has patient ever been sexually  abused/assaulted/raped as an adolescent or adult?: No Was the patient ever a victim of a crime or a disaster?: No Witnessed domestic violence?: No Has patient been affected by domestic violence as an adult?: No  Education:  Highest grade of school patient has completed: 8th grade Currently a student?: No Learning disability?: Yes What learning problems does patient have?: Pt reports that he does not remember.  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?: No  Financial Resources:   Surveyor, quantity resources: [employer], Support from parents / caregiver Does patient have a Lawyer or guardian?: No  Alcohol/Substance Abuse:   What has been your use of drugs/alcohol within the last 12 months?: Alcohol: every once in a blue moon, I don't drink much Marijuana: daily, 2-3 blunts a day If attempted suicide, did drugs/alcohol play a role in this?: No Alcohol/Substance Abuse Treatment Hx: Denies past history Has alcohol/substance abuse ever caused legal problems?: Yes (Pt reports that he has court on 11-21-2023 for drug charges.)  Social Support System:   Patient's Community Support System: None Describe Community Support System: Pt denies. Type of faith/religion: Chrisitian How does patient's faith help to cope with current illness?: Pt denies.  Leisure/Recreation:   Do You Have Hobbies?: Yes Leisure and Hobbies: my dogs  Strengths/Needs:   What is the patient's perception of their strengths?: nothing right now because I am here Patient states they can use these personal strengths during their treatment to contribute to their recovery: Pt denies. Patient states these barriers may  affect/interfere with their treatment: Pt denies. Patient  states these barriers may affect their return to the community: Pt denies. Other important information patient would like considered in planning for their treatment: Pt denies.  Discharge Plan:   Currently receiving community mental health services: No Patient states concerns and preferences for aftercare planning are: Pt reports that he is open for a referral for aftercare treatment. Patient states they will know when they are safe and ready for discharge when: I'm ready to go Does patient have access to transportation?: Yes Does patient have financial barriers related to discharge medications?: No Will patient be returning to same living situation after discharge?: Yes  Summary/Recommendations:   Summary and Recommendations (to be completed by the evaluator): Patient is a 48 year old male from [place], [place] [facility]).  Patient presents to the hospital for concerns for "acute change in mental status/unresponsiveness in the vehicle".  When completing an assessment with this clinician patient stated that he was not sure what led to his admission to the hospital.  He reports that he came to the hospital for "blood pressure stuff" and "somehow ended up here". He reports that he does not know what led to his referral for the inpatient psychiatric unit.  Patient denies frequent use of alcohol to this clinician, stating "I like to drink a cold beer every once in a while, when it's hot out."  He reports that he smokes two to three blunts of marijuana daily and that it helps with his chronic back pain.  He reports that he does not have a current therapist or psychiatrist, however, is open to see someone at discharge.  He declined to identify any triggers to his current mental health state.  Recommendations include crisis stabilization, therapeutic milieu, encourage group attendance and participation, medication management for detox/mood  stabilization and development of comprehensive mental wellness/sobriety plan.  Sherryle JINNY Margo. 11/07/2023

## 2023-11-07 NOTE — Progress Notes (Signed)
   11/07/23 0846  Psych Admission Type (Psych Patients Only)  Admission Status Involuntary  Psychosocial Assessment  Patient Complaints None  Eye Contact Fair  Facial Expression Animated  Affect Appropriate to circumstance  Speech Logical/coherent  Interaction Assertive  Motor Activity Other (Comment) (WDL)  Appearance/Hygiene In scrubs  Behavior Characteristics Cooperative  Mood Pleasant  Thought Process  Coherency WDL  Content WDL  Delusions None reported or observed  Perception WDL  Hallucination None reported or observed  Judgment Poor  Confusion None  Danger to Self  Current suicidal ideation? Denies  Danger to Others  Danger to Others None reported or observed

## 2023-11-07 NOTE — Group Note (Signed)
 Date:  11/07/2023 Time:  5:20 PM  Group Topic/Focus:  Activity Group:  The focus of the group is to promote activity for the patients and encourage them to go outside to the courtyard and get some fresh air and some exercise.    Participation Level:  Did Not Attend   Ronald Banks 11/07/2023, 5:20 PM

## 2023-11-07 NOTE — Group Note (Signed)
 Date:  11/07/2023 Time:  1:11 PM  Group Topic/Focus:  Emotional Education:   The focus of this group is to discuss what feelings/emotions are, and how they are experienced.    Participation Level:  Did Not Attend   Ronald Banks 11/07/2023, 1:11 PM

## 2023-11-07 NOTE — Group Note (Signed)
 Date:  11/07/2023 Time:  8:48 PM  Group Topic/Focus:  Wrap-Up Group:   The focus of this group is to help patients review their daily goal of treatment and discuss progress on daily workbooks.    Participation Level:  Minimal  Participation Quality:  Appropriate and Attentive  Affect:  Appropriate  Cognitive:  Appropriate  Insight: Appropriate and Good  Engagement in Group:  Engaged  Modes of Intervention:  Support  Additional Comments:     Ronald Banks 11/07/2023, 8:48 PM

## 2023-11-07 NOTE — [facility] Suicide Risk Assessment (Signed)
 [facility] Admission Suicide Risk Assessment   Nursing information obtained from:  Patient Demographic factors:  Caucasian, Divorced or widowed, Male, Unemployed Current Mental Status:  Self-harm behaviors Loss Factors:  Decrease in vocational status Historical Factors:  Prior suicide attempts Risk Reduction Factors:  Living with another person, especially a relative  Total Time spent with patient: 30 minutes Principal Problem: Suicide attempt (HCC) Diagnosis:  Principal Problem:   Suicide attempt (HCC)  Subjective Data: Ronald Banks is a pleasant 48 y.o. male with medical history significant for type 1 diabetes on insulin  pump, HTN, HLD, hypothyroidism, depression, tobacco dependence, polysubstance abuse who presented to ED today via private vehicle for acute change in mental status/unresponsiveness in the vehicle.  Patient was driven by a friend who was with the patient throughout the night.  Patient was  in his normal state until last night.  Patient and his friend were drinking out and did not feel well and wanted to be taken home.  While the friend was driving him home he became unresponsive and the friend drove him to the emergency room at [facility] He did endorse intentionally taking insulin  prior to his arrival to the emergency department in an attempt to kill self. He reported he did not tell his friend what he did and that his friend drove him to the emergency department because he became unresponsive. He endorsed homicidal ideations but would not elaborate on who or if he had a plan. He did report the homicidal ideations were due to a certain situation Patient is admitted to adult psych unit with Q15 min safety monitoring. Multidisciplinary team approach is offered. Medication management; group/milieu therapy is offered.   Continued Clinical Symptoms:    The Alcohol Use Disorders Identification Test, Guidelines for Use in Primary Care, Second Edition.  World Science writer [facility]). Score  between 0-7:  no or low risk or alcohol related problems. Score between 8-15:  moderate risk of alcohol related problems. Score between 16-19:  high risk of alcohol related problems. Score 20 or above:  warrants further diagnostic evaluation for alcohol dependence and treatment.   CLINICAL FACTORS:   Depression:   Impulsivity   Musculoskeletal: Strength & Muscle Tone: within normal limits Gait & Station: normal Patient leans: N/A  Psychiatric Specialty Exam:  Presentation  General Appearance:  Casual  Eye Contact: Fair  Speech: Clear and Coherent  Speech Volume: Normal  Handedness: Right   Mood and Affect  Mood: Euthymic  Affect: Appropriate   Thought Process  Thought Processes: Coherent  Descriptions of Associations:Intact  Orientation:Full (Time, Place and Person)  Thought Content:Illogical  History of Schizophrenia/Schizoaffective disorder:No data recorded Duration of Psychotic Symptoms:No data recorded Hallucinations:Hallucinations: None  Ideas of Reference:None  Suicidal Thoughts:Suicidal Thoughts: No  Homicidal Thoughts:Homicidal Thoughts: No   Sensorium  Memory: Immediate Fair; Recent Fair  Judgment: Impaired  Insight: Shallow   Executive Functions  Concentration: Fair  Attention Span: Fair  Recall: [employer] of Knowledge: Fair  Language: Fair   Psychomotor Activity  Psychomotor Activity: Psychomotor Activity: Normal   Assets  Assets: Communication Skills; Desire for Improvement; Physical Health; Resilience   Sleep  Sleep: Sleep: Fair    Physical Exam: Physical Exam ROS Blood pressure 130/78, pulse (!) 58, temperature 98 F (36.7 C), temperature source Oral, resp. rate 14, height 5' 10 (1.778 m), weight 79.8 kg, SpO2 97%. Body mass index is 25.25 kg/m.   COGNITIVE FEATURES THAT CONTRIBUTE TO RISK:  None    SUICIDE RISK:   Minimal:  No identifiable suicidal ideation.  Patients presenting with  no risk factors but with morbid ruminations; may be classified as minimal risk based on the severity of the depressive symptoms  PLAN OF CARE: Patient is admitted to adult psych unit with Q15 min safety monitoring. Multidisciplinary team approach is offered. Medication management; group/milieu therapy is offered.   I certify that inpatient services furnished can reasonably be expected to improve the patient's condition.   Allyn Foil, MD 11/07/2023, 5:08 PM

## 2023-11-07 NOTE — Plan of Care (Signed)

## 2023-11-07 NOTE — Group Note (Signed)
 LCSW Group Therapy Note  Group Date: 11/07/2023 Start Time: 1300 End Time: 1400   Type of Therapy and Topic:  Group Therapy - Healthy vs Unhealthy Coping Skills  Participation Level:  Did Not Attend   Description of Group The focus of this group was to determine what unhealthy coping techniques typically are used by group members and what healthy coping techniques would be helpful in coping with various problems. Patients were guided in becoming aware of the differences between healthy and unhealthy coping techniques. Patients were asked to identify 2-3 healthy coping skills they would like to learn to use more effectively.  Therapeutic Goals Patients learned that coping is what human beings do all day long to deal with various situations in their lives Patients defined and discussed healthy vs unhealthy coping techniques Patients identified their preferred coping techniques and identified whether these were healthy or unhealthy Patients determined 2-3 healthy coping skills they would like to become more familiar with and use more often. Patients provided support and ideas to each other   Summary of Patient Progress:  Patient did not attend.    Therapeutic Modalities Cognitive Behavioral Therapy Motivational Interviewing  Ronald Banks 11/07/2023  2:31 PM

## 2023-11-07 NOTE — Inpatient Diabetes Management (Signed)
 Inpatient Diabetes Program Recommendations  AACE/ADA: New Consensus Statement on Inpatient Glycemic Control (2015)  Target Ranges:  Prepandial:   less than 140 mg/dL      Peak postprandial:   less than 180 mg/dL (1-2 hours)      Critically ill patients:  140 - 180 mg/dL    Latest Reference Range & Units 11/06/23 08:00 11/06/23 11:56 11/06/23 17:03 11/06/23 20:48  Glucose-Capillary 70 - 99 mg/dL 657 (H)  7 units Novolog   22 units Lantus @1022   258 (H)  5 units Novolog   203 (H)  3 units Novolog   175 (H)  (H): Data is abnormally high  Latest Reference Range & Units 11/07/23 07:36  Glucose-Capillary 70 - 99 mg/dL 50 (L)  (L): Data is abnormally low     History: Type 1 diabetes  Home DM Meds:  T-Slim insulin  pump with Novolog  (total basal 34.3 units/24 hours; I:CR 1:8 grams; I:SF 1:40 mg/dl) Dexcom G7 CGM  Current Orders:  Lantus 22 units daily Novolog  Sensitive Correction Scale/ SSI (0-9 units) TID AC + HS    Note Hypoglycemia this AM but afternoon CBGs elevated yesterday  Please consider:  1. Reduce Lantus to 18 units daily  2. Start Novolog  Meal Coverage: Novolog  3 units TID with meals HOLD if pt NPO HOLD if pt eats <50% meals     --Will follow patient during hospitalization--  Adina Rudolpho Arrow RN, MSN, CDCES Diabetes Coordinator Inpatient Glycemic Control Team Team Pager: [phone] (8a-5p)

## 2023-11-08 DIAGNOSIS — F4323 Adjustment disorder with mixed anxiety and depressed mood: Secondary | ICD-10-CM

## 2023-11-08 LAB — GLUCOSE, CAPILLARY
Glucose-Capillary: 160 mg/dL — ABNORMAL HIGH (ref 70–99)
Glucose-Capillary: 332 mg/dL — ABNORMAL HIGH (ref 70–99)
Glucose-Capillary: 191 mg/dL — ABNORMAL HIGH (ref 70–99)
Glucose-Capillary: 282 mg/dL — ABNORMAL HIGH (ref 70–99)
Glucose-Capillary: 287 mg/dL — ABNORMAL HIGH (ref 70–99)

## 2023-11-08 MED ORDER — INSULIN ASPART 100 UNIT/ML IJ SOLN
5.0000 [IU] | Freq: Three times a day (TID) | INTRAMUSCULAR | Status: DC
  Administered 2023-11-08 – 2023-11-09 (×3): 5 [IU] via SUBCUTANEOUS
  Filled 2023-11-08 (×2): qty 1

## 2023-11-08 NOTE — Group Note (Signed)
 Date:  11/08/2023 Time:  9:04 PM  Group Topic/Focus:  Coping With Mental Health Crisis:   The purpose of this group is to help patients identify strategies for coping with mental health crisis.  Group discusses possible causes of crisis and ways to manage them effectively.    Participation Level:  Active  Participation Quality:  Appropriate  Affect:  Appropriate  Cognitive:  Appropriate  Insight: Appropriate  Engagement in Group:  Engaged  Modes of Intervention:  Education  Additional Comments:    Shanea Karney L 11/08/2023, 9:04 PM

## 2023-11-08 NOTE — Group Note (Signed)
 LCSW Group Therapy Note  Group Date: 11/08/2023 Start Time: 1315 End Time: 1400   Type of Therapy and Topic:  Group Therapy: Anger Cues and Responses  Participation Level:  Did Not Attend   Description of Group:   In this group, patients learned how to recognize the physical, cognitive, emotional, and behavioral responses they have to anger-provoking situations.  They identified a recent time they became angry and how they reacted.  They analyzed how their reaction was possibly beneficial and how it was possibly unhelpful.  The group discussed a variety of healthier coping skills that could help with such a situation in the future.  Focus was placed on how helpful it is to recognize the underlying emotions to our anger, because working on those can lead to a more permanent solution as well as our ability to focus on the important rather than the urgent.  Therapeutic Goals: Patients will remember their last incident of anger and how they felt emotionally and physically, what their thoughts were at the time, and how they behaved. Patients will identify how their behavior at that time worked for them, as well as how it worked against them. Patients will explore possible new behaviors to use in future anger situations. Patients will learn that anger itself is normal and cannot be eliminated, and that healthier reactions can assist with resolving conflict rather than worsening situations.  Summary of Patient Progress:   X  Therapeutic Modalities:   Cognitive Behavioral Therapy    Sherryle JINNY Margo, LCSW 11/08/2023  2:42 PM

## 2023-11-08 NOTE — [facility] Suicide Risk Assessment (Signed)
 [facility] INPATIENT:  Family/Significant Other Suicide Prevention Education  Suicide Prevention Education:  Contact Attempts: Darla Williamson, mother, [phone], has been identified by the patient as the family member/significant other with whom the patient will be residing, and identified as the person(s) who will aid the patient in the event of a mental health crisis.  With written consent from the patient, two attempts were made to provide suicide prevention education, prior to and/or following the patient's discharge.  We were unsuccessful in providing suicide prevention education.  A suicide education pamphlet was given to the patient to share with family/significant other.  Date and time of first attempt:11/08/2023 at 9:29AM Date and time of second attempt: Second attempt is needed.  CSW left HIPAA compliant voicemail requesting a return phone call.   Sherryle JINNY Margo 11/08/2023, 9:28 AM

## 2023-11-08 NOTE — Plan of Care (Signed)
   Problem: Education: Goal: Knowledge of [place] [facility] Education information/materials will improve Outcome: Progressing Goal: Emotional status will improve Outcome: Progressing Goal: Mental status will improve Outcome: Progressing Goal: Verbalization of understanding the information provided will improve Outcome: Progressing

## 2023-11-08 NOTE — Progress Notes (Signed)
   11/08/23 0600  Psych Admission Type (Psych Patients Only)  Admission Status Involuntary  Psychosocial Assessment  Patient Complaints None  Eye Contact Fair  Facial Expression Animated  Affect Appropriate to circumstance  Speech Logical/coherent  Interaction Assertive  Motor Activity Fidgety  Appearance/Hygiene In scrubs  Behavior Characteristics Cooperative  Mood Pleasant  Aggressive Behavior  Effect No apparent injury  Thought Process  Coherency WDL  Content WDL  Delusions None reported or observed  Perception WDL  Hallucination None reported or observed  Judgment Poor  Confusion None  Danger to Self  Current suicidal ideation? Denies  Danger to Others  Danger to Others None reported or observed

## 2023-11-08 NOTE — Plan of Care (Signed)

## 2023-11-08 NOTE — Inpatient Diabetes Management (Signed)
 Inpatient Diabetes Program Recommendations  AACE/ADA: New Consensus Statement on Inpatient Glycemic Control (2015)  Target Ranges:  Prepandial:   less than 140 mg/dL      Peak postprandial:   less than 180 mg/dL (1-2 hours)      Critically ill patients:  140 - 180 mg/dL    Latest Reference Range & Units 11/07/23 07:36 11/07/23 08:36 11/07/23 11:16 11/07/23 16:33 11/07/23 19:52  Glucose-Capillary 70 - 99 mg/dL 50 (L) 811 (H) 694 (H)  10 units Novolog   18 units Lantus @1034   133 (H)  4 units Novolog   346 (H)  4 units Novolog      Latest Reference Range & Units 11/08/23 07:32 11/08/23 11:48  Glucose-Capillary 70 - 99 mg/dL 839 (H)  5 units Novolog   18 units Lantus  332 (H)  10 units Novolog    (H): Data is abnormally high   History: Type 1 diabetes   Home DM Meds:  T-Slim insulin  pump with Novolog  (total basal 34.3 units/24 hours; I:CR 1:8 grams; I:SF 1:40 mg/dl) Dexcom G7 CGM   Current Orders:  Lantus 18 units daily Novolog  Sensitive Correction Scale/ SSI (0-9 units) TID AC + HS Novolog  3 units TID with meals    MD- Please consider increasing the Novolog  Meal Coverage to 5 units TID with meals    --Will follow patient during hospitalization--  Adina Rudolpho Arrow RN, MSN, CDCES Diabetes Coordinator Inpatient Glycemic Control Team Team Pager: [phone] (8a-5p)

## 2023-11-08 NOTE — Progress Notes (Signed)
 [facility] MD Progress Note  11/08/2023 12:33 PM Ronald Banks  MRN:  [id number]  HAIM HANSSON is a pleasant 48 y.o. male with medical history significant for type 1 diabetes on insulin  pump, HTN, HLD, hypothyroidism, depression, tobacco dependence, polysubstance abuse who presented to ED today via private vehicle for acute change in mental status/unresponsiveness in the vehicle.  Patient was driven by a friend who was with the patient throughout the night.  Patient was  in his normal state until last night.  Patient and his friend were drinking out and did not feel well and wanted to be taken home.  While the friend was driving him home he became unresponsive and the friend drove him to the emergency room at [facility]. He did endorse intentionally taking insulin  prior to his arrival to the emergency department in an attempt to kill self. He reported he did not tell his friend what he did and that his friend drove him to the emergency department because he became unresponsive. He endorsed homicidal ideations but would not elaborate on who or if he had a plan. He did report the homicidal ideations were due to a certain situation Patient is admitted to adult psych unit with Q15 min safety monitoring. Multidisciplinary team approach is offered. Medication management; group/milieu therapy is offered.  Subjective:  Chart reviewed, case discussed in multidisciplinary meeting, patient seen during rounds.  Patient is noted to be sitting in the dayroom and talking to other peers.  He offers no complaints.  He remains minimally engaged in interview and reports that he needs to go back home to his dogs.  Provider discussed the concern about his statements of SI and HI when he came to the emergency room.  Patient denies auditory/visual hallucinations.  He denies feeling depressed or anxious.  He is tolerating medications well with no reported side effects   Sleep: Fair  Appetite:  Fair  Past Psychiatric History: see  h&P Family History:  Family History  Problem Relation Age of Onset   Diabetes Mother    Cancer Mother        Brain, breast    Diabetes Sister    Diabetes Maternal Grandmother    Social History:  Social History   Substance and Sexual Activity  Alcohol Use Yes     Social History   Substance and Sexual Activity  Drug Use Yes   Types: Marijuana   Comment: Several times per week    Social History   Socioeconomic History   Marital status: Single    Spouse name: Not on file   Number of children: Not on file   Years of education: 8   Highest education level: 8th grade  Occupational History   Not on file  Tobacco Use   Smoking status: Every Day    Average packs/day: 1.5 packs/day for 31.0 years (46.0 ttl pk-yrs)    Types: Cigarettes    Start date: 2024   Smokeless tobacco: Never  Vaping Use   Vaping status: Never Used  Substance and Sexual Activity   Alcohol use: Yes   Drug use: Yes    Types: Marijuana    Comment: Several times per week   Sexual activity: Yes  Other Topics Concern   Not on file  Social History Narrative   Right handed    Caffeine 2 cups per day    Lives at home with his mom    Social Drivers of Health   Financial Resource Strain: Low Risk  (05/02/2023)  Overall Financial Resource Strain (CARDIA)    Difficulty of Paying Living Expenses: Not very hard  Food Insecurity: No Food Insecurity (11/06/2023)   Hunger Vital Sign    Worried About Running Out of Food in the Last Year: Never true    Ran Out of Food in the Last Year: Never true  Transportation Needs: No Transportation Needs (11/06/2023)   PRAPARE - Administrator, Civil Service (Medical): No    Lack of Transportation (Non-Medical): No  Physical Activity: Inactive (05/02/2023)   Exercise Vital Sign    Days of Exercise per Week: 0 days    Minutes of Exercise per Session: 0 min  Stress: Not on file  Social Connections: Socially Isolated (10/31/2023)   Social Connection and  Isolation Panel    Frequency of Communication with Friends and Family: Three times a week    Frequency of Social Gatherings with Friends and Family: Twice a week    Attends Religious Services: Never    Database administrator or Organizations: No    Attends Engineer, structural: Never    Marital Status: Never married   Past Medical History:  Past Medical History:  Diagnosis Date   ADHD (attention deficit hyperactivity disorder), inattentive type    Chronic midline low back pain with right-sided sciatica 02/11/2018   Chronic right shoulder pain 02/11/2018   DKA (diabetic ketoacidosis) 08/17/2020   DOE (dyspnea on exertion) 05/19/2019   Onset 2015/16 05/19/2019   Walked RA x two laps =  approx 581ft @ brisk pace - stopped due to end of study with sats of 98 % at the end of the study and c/o sob s cp  > rec CPST next  - alpha one AT  Screen   05/19/19 MM  152  - CPST  06/30/19  FEV1 3.79 (93%) with ratio 0.73 and nl f/v loop: ex study  probabably  wnl when wt factored  In  - sob with ventilatory reserve suggested sub max effort and de   Hyperlipidemia due to type 1 diabetes mellitus 04/18/2018   Hypertension associated with diabetes 04/18/2018   Trial off acei 04/07/2019 due to cough > improved by 07/03/2019 with just a typical smoker's rattle (no longer cough to point of gag/vomit)   Hypothyroidism    Major depressive disorder 10/09/2019   Moderate nonproliferative diabetic retinopathy of both eyes (HCC) 05/19/2019   Tobacco dependence 10/09/2019   Type 1 diabetes mellitus with diabetic polyneuropathy 04/18/2018    Past Surgical History:  Procedure Laterality Date   DENTAL SURGERY     EAR CYST EXCISION Right 03/24/2020   Procedure: EXCISION PRE-AURICULAR CYST;  Surgeon: Milissa Hamming, MD;  Location: [facility];  Service: ENT;  Laterality: Right;  diabetic insulin  pump   PREAURICULAR CYST EXCISION Left 07/07/2020   Procedure: EXCISION PREAURICULAR CYST ADULT;  Surgeon:  Milissa Hamming, MD;  Location: [facility];  Service: ENT;  Laterality: Left;    Current Medications: Current Facility-Administered Medications  Medication Dose Route Frequency Provider Last Rate Last Admin   alum & mag hydroxide-simeth (MAALOX/MYLANTA) 200-200-20 MG/5ML suspension 30 mL  30 mL Oral Q4H PRN Smith, Annie B, NP       atorvastatin  (LIPITOR) tablet 10 mg  10 mg Oral Daily Smith, Annie B, NP   10 mg at 11/07/23 2039   haloperidol (HALDOL) tablet 5 mg  5 mg Oral TID PRN Smith, Annie B, NP       And   diphenhydrAMINE  (BENADRYL )  capsule 50 mg  50 mg Oral TID PRN Smith, Annie B, NP       haloperidol lactate (HALDOL) injection 5 mg  5 mg Intramuscular TID PRN Smith, Annie B, NP       And   diphenhydrAMINE  (BENADRYL ) injection 50 mg  50 mg Intramuscular TID PRN Smith, Annie B, NP       And   LORazepam (ATIVAN) injection 2 mg  2 mg Intramuscular TID PRN Smith, Annie B, NP       haloperidol lactate (HALDOL) injection 10 mg  10 mg Intramuscular TID PRN Smith, Annie B, NP       And   diphenhydrAMINE  (BENADRYL ) injection 50 mg  50 mg Intramuscular TID PRN Smith, Annie B, NP       And   LORazepam (ATIVAN) injection 2 mg  2 mg Intramuscular TID PRN Smith, Annie B, NP       DULoxetine  (CYMBALTA ) DR capsule 90 mg  90 mg Oral Daily Saben Donigan, MD   90 mg at 11/08/23 0827   folic acid (FOLVITE) tablet 1 mg  1 mg Oral Daily Smith, Annie B, NP   1 mg at 11/08/23 0827   insulin  aspart (novoLOG ) injection 0-5 Units  0-5 Units Subcutaneous QHS Smith, Annie B, NP   4 Units at 11/07/23 2039   insulin  aspart (novoLOG ) injection 0-9 Units  0-9 Units Subcutaneous TID WC Smith, Annie B, NP   7 Units at 11/08/23 1201   insulin  aspart (novoLOG ) injection 5 Units  5 Units Subcutaneous TID WC Janayla Marik, MD       insulin  glargine (LANTUS) injection 18 Units  18 Units Subcutaneous Daily Hunter, Crystal L, PA-C   18 Units at 11/08/23 0827   levothyroxine  (SYNTHROID ) tablet 250 mcg  250 mcg  Oral Daily Smith, Annie B, NP   250 mcg at 11/08/23 0830   magnesium hydroxide (MILK OF MAGNESIA) suspension 30 mL  30 mL Oral Daily PRN Smith, Annie B, NP       multivitamin with minerals tablet 1 tablet  1 tablet Oral Daily Smith, Annie B, NP   1 tablet at 11/08/23 9175   oxyCODONE  (Oxy IR/ROXICODONE ) immediate release tablet 5 mg  5 mg Oral Q8H PRN Smith, Annie B, NP   5 mg at 11/07/23 1738   pantoprazole  (PROTONIX ) EC tablet 40 mg  40 mg Oral Daily Smith, Annie B, NP   40 mg at 11/08/23 0824   thiamine (VITAMIN B1) tablet 100 mg  100 mg Oral Daily Smith, Annie B, NP   100 mg at 11/08/23 9160   Or   thiamine (VITAMIN B1) injection 100 mg  100 mg Intravenous Daily Smith, Annie B, NP        Lab Results:  Results for orders placed or performed during the hospital encounter of 11/06/23 (from the past 48 hours)  Glucose, capillary     Status: Abnormal   Collection Time: 11/06/23  8:48 PM  Result Value Ref Range   Glucose-Capillary 175 (H) 70 - 99 mg/dL    Comment: Glucose reference range applies only to samples taken after fasting for at least 8 hours.  Glucose, capillary     Status: Abnormal   Collection Time: 11/07/23  7:36 AM  Result Value Ref Range   Glucose-Capillary 50 (L) 70 - 99 mg/dL    Comment: Glucose reference range applies only to samples taken after fasting for at least 8 hours.  Glucose, capillary     Status:  Abnormal   Collection Time: 11/07/23  8:36 AM  Result Value Ref Range   Glucose-Capillary 188 (H) 70 - 99 mg/dL    Comment: Glucose reference range applies only to samples taken after fasting for at least 8 hours.  Glucose, capillary     Status: Abnormal   Collection Time: 11/07/23 11:16 AM  Result Value Ref Range   Glucose-Capillary 305 (H) 70 - 99 mg/dL    Comment: Glucose reference range applies only to samples taken after fasting for at least 8 hours.  Glucose, capillary     Status: Abnormal   Collection Time: 11/07/23  4:33 PM  Result Value Ref Range    Glucose-Capillary 133 (H) 70 - 99 mg/dL    Comment: Glucose reference range applies only to samples taken after fasting for at least 8 hours.  Glucose, capillary     Status: Abnormal   Collection Time: 11/07/23  7:52 PM  Result Value Ref Range   Glucose-Capillary 346 (H) 70 - 99 mg/dL    Comment: Glucose reference range applies only to samples taken after fasting for at least 8 hours.  Glucose, capillary     Status: Abnormal   Collection Time: 11/08/23  7:32 AM  Result Value Ref Range   Glucose-Capillary 160 (H) 70 - 99 mg/dL    Comment: Glucose reference range applies only to samples taken after fasting for at least 8 hours.   Comment 1 Notify RN   Glucose, capillary     Status: Abnormal   Collection Time: 11/08/23 11:48 AM  Result Value Ref Range   Glucose-Capillary 332 (H) 70 - 99 mg/dL    Comment: Glucose reference range applies only to samples taken after fasting for at least 8 hours.   Comment 1 Notify RN     Blood Alcohol level:  Lab Results  Component Value Date   ETH 112 (H) 10/31/2023   ETH <10 11/25/2022    Metabolic Disorder Labs: Lab Results  Component Value Date   HGBA1C 9.8 (H) 10/31/2023   MPG 234.56 10/31/2023   MPG 148 08/17/2020   No results found for: PROLACTIN Lab Results  Component Value Date   CHOL 226 (H) 09/11/2018   TRIG 89 09/11/2018   HDL 65 09/11/2018   CHOLHDL 3.5 09/11/2018   LDLCALC 143 (H) 09/11/2018   LDLCALC 214 (H) 03/04/2018    Physical Findings: AIMS:  , ,  ,  ,    CIWA:    COWS:      Psychiatric Specialty Exam:  Presentation  General Appearance:  Casual  Eye Contact: Fair  Speech: Clear and Coherent  Speech Volume: Normal    Mood and Affect  Mood: Euthymic  Affect: Appropriate   Thought Process  Thought Processes: Coherent  Descriptions of Associations:Intact  Orientation:Full (Time, Place and Person)  Thought Content:Illogical  Hallucinations:Hallucinations: None  Ideas of  Reference:None  Suicidal Thoughts:Suicidal Thoughts: No  Homicidal Thoughts:Homicidal Thoughts: No   Sensorium  Memory: Immediate Fair; Recent Fair  Judgment: Impaired  Insight: Shallow   Executive Functions  Concentration: Fair  Attention Span: Fair  Recall: [employer] of Knowledge: Fair  Language: Fair   Psychomotor Activity  Psychomotor Activity: Psychomotor Activity: Normal  Musculoskeletal: Strength & Muscle Tone: within normal limits Gait & Station: normal Assets  Assets: Manufacturing systems engineer; Desire for Improvement; Physical Health; Resilience    Physical Exam: Physical Exam Vitals and nursing note reviewed.    ROS Blood pressure 113/64, pulse (!) 51, temperature 98.4 F (36.9  C), temperature source Oral, resp. rate 17, height 5' 10 (1.778 m), weight 79.8 kg, SpO2 98%. Body mass index is 25.25 kg/m.  Diagnosis: Active Problems:   Adjustment disorder with mixed anxiety and depressed mood  Clinical Decision Making: Patient is currently admitted after insulin  overdose and critical hypoglycemia and he did acknowledge it as suicide attempt in ED.Since admission patient remains superficial and minimally engaged in treatment plan.  Treatment Plan Summary:  Safety and Monitoring:             -- Voluntary admission to inpatient psychiatric unit for safety, stabilization and treatment             -- Daily contact with patient to assess and evaluate symptoms and progress in treatment             -- Patient's case to be discussed in multi-disciplinary team meeting             -- Observation Level: q15 minute checks             -- Vital signs:  q12 hours             -- Precautions: suicide, elopement, and assault   2. Psychiatric Diagnoses and Treatment:                cymbalta  increased to 90 mg   -- The risks/benefits/side-effects/alternatives to this medication were discussed in detail with the patient and time was given for questions. The  patient consents to medication trial.                -- Metabolic profile and EKG monitoring obtained while on an atypical antipsychotic (BMI: Lipid Panel: HbgA1c: QTc:)              -- Encouraged patient to participate in unit milieu and in scheduled group therapies               4. Discharge Planning:   -- Social work and case management to assist with discharge planning and identification of hospital follow-up needs prior to discharge  -- Estimated LOS: 3-4 days  Allyn Foil, MD 11/08/2023, 12:33 PM

## 2023-11-08 NOTE — [facility] Counselor (Signed)
 CSW spoke with the patient in regards to report that he missed court.    He reports that he did NOT miss court but was supposed to meet with his lawyer.  He reports they're trying to throw the book at me.    CSW clarified that this was for [facility].    CSW advised that patient would need to get the contact information from his phone so he can make proper communication.   Sherryle Margo, MSW, LCSW 11/08/2023 3:38 PM

## 2023-11-08 NOTE — Plan of Care (Signed)
  Problem: Education: Goal: Knowledge of [facility] Education information/materials will improve 11/08/2023 0701 by Jannifer Tonna LABOR, RN Outcome: Progressing 11/08/2023 0701 by Jannifer Tonna LABOR, RN Outcome: Progressing Goal: Emotional status will improve 11/08/2023 0701 by Jannifer Tonna LABOR, RN Outcome: Progressing 11/08/2023 0701 by Jannifer Tonna LABOR, RN Outcome: Progressing Goal: Mental status will improve 11/08/2023 0701 by Jannifer Tonna LABOR, RN Outcome: Progressing 11/08/2023 0701 by Jannifer Tonna LABOR, RN Outcome: Progressing Goal: Verbalization of understanding the information provided will improve 11/08/2023 0701 by Jannifer Tonna LABOR, RN Outcome: Progressing 11/08/2023 0701 by Jannifer Tonna LABOR, RN Outcome: Progressing

## 2023-11-08 NOTE — Progress Notes (Signed)
   11/08/23 0825  Psych Admission Type (Psych Patients Only)  Admission Status Involuntary  Psychosocial Assessment  Patient Complaints None  Eye Contact Fair  Facial Expression Animated  Affect Appropriate to circumstance  Speech Logical/coherent  Interaction Assertive  Motor Activity Fidgety  Appearance/Hygiene In scrubs  Behavior Characteristics Cooperative  Mood Pleasant  Aggressive Behavior  Effect No apparent injury  Thought Process  Coherency WDL  Content WDL  Delusions None reported or observed  Perception WDL  Hallucination None reported or observed  Judgment Poor  Confusion None  Danger to Self  Current suicidal ideation? Denies  Danger to Others  Danger to Others None reported or observed

## 2023-11-08 NOTE — Group Note (Signed)
 Recreation Therapy Group Note   Group Topic:Coping Skills  Group Date: 11/08/2023 Start Time: 1000 End Time: 1050 Facilitators: Celestia Jeoffrey BRAVO, LRT, CTRS Location: Craft Room  Group Description: Mind Map.  Patient was provided a blank template of a diagram with 32 blank boxes in a tiered system, branching from the center (similar to a bubble chart). LRT directed patients to label the middle of the diagram Coping Skills. LRT and patients then came up with 8 different coping skills as examples. Pt were directed to record their coping skills in the 2nd tier boxes closest to the center.  Patients would then share their coping skills with the group as LRT wrote them out. LRT gave a handout of 99 different coping skills at the end of group.   Goal Area(s) Addressed: Patients will be able to define "coping skills". Patient will identify new coping skills.  Patient will increase communication.  Affect/Mood: Appropriate and Full range   Participation Level: Moderate    Clinical Observations/Individualized Feedback: Ronald Banks was somewhat active in their participation of session activities and group discussion. Pt identified anything with my dogs as his coping skills. Pt did not complete a handout.    Plan: Continue to engage patient in RT group sessions 2-3x/week.   Jeoffrey BRAVO Celestia, LRT, CTRS 11/08/2023 12:58 PM

## 2023-11-09 DIAGNOSIS — F4323 Adjustment disorder with mixed anxiety and depressed mood: Secondary | ICD-10-CM | POA: Diagnosis not present

## 2023-11-09 LAB — GLUCOSE, CAPILLARY
Glucose-Capillary: 238 mg/dL — ABNORMAL HIGH (ref 70–99)
Glucose-Capillary: 391 mg/dL — ABNORMAL HIGH (ref 70–99)
Glucose-Capillary: 349 mg/dL — ABNORMAL HIGH (ref 70–99)
Glucose-Capillary: 187 mg/dL — ABNORMAL HIGH (ref 70–99)

## 2023-11-09 MED ORDER — INSULIN GLARGINE 100 UNIT/ML ~~LOC~~ SOLN
20.0000 [IU] | Freq: Every day | SUBCUTANEOUS | Status: DC
  Administered 2023-11-10 – 2023-11-12 (×3): 20 [IU] via SUBCUTANEOUS
  Filled 2023-11-09 (×3): qty 0.2

## 2023-11-09 MED ORDER — INSULIN ASPART 100 UNIT/ML IJ SOLN
8.0000 [IU] | Freq: Three times a day (TID) | INTRAMUSCULAR | Status: DC
  Administered 2023-11-09 – 2023-11-12 (×6): 8 [IU] via SUBCUTANEOUS
  Filled 2023-11-09 (×6): qty 1

## 2023-11-09 NOTE — Group Note (Signed)
 Date:  11/09/2023 Time:  11:05 AM  Group Topic/Focus:  Goals Group:   The focus of this group is to help patients establish daily goals to achieve during treatment and discuss how the patient can incorporate goal setting into their daily lives to aide in recovery.    Participation Level:  Active  Participation Quality:  Appropriate  Affect:  Appropriate  Cognitive:  Alert  Insight: Appropriate  Engagement in Group:  Engaged  Modes of Intervention:  Activity, Discussion, and Education  Additional Comments:    Skippy LITTIE Bennett 11/09/2023, 11:05 AM

## 2023-11-09 NOTE — Group Note (Signed)
 Recreation Therapy Group Note   Group Topic:Leisure Education  Group Date: 11/09/2023 Start Time: 1020 End Time: 1130 Facilitators: Celestia Jeoffrey BRAVO, LRT, CTRS Location: Craft Room  Group Description: Leisure. Patients were given the option to choose from journaling, coloring, drawing, making origami, playing with playdoh, listening to music or singing karaoke. LRT and pts discussed the meaning of leisure, the importance of participating in leisure during their free time/when they're outside of the hospital, as well as how our leisure interests can also serve as coping skills.   Goal Area(s) Addressed:  Patient will identify a current leisure interest.  Patient will learn the definition of "leisure". Patient will practice making a positive decision. Patient will have the opportunity to try a new leisure activity. Patient will communicate with peers and LRT.    Affect/Mood: Appropriate   Participation Level: Active and Engaged   Participation Quality: Minimal Cues   Behavior: Calm and Cooperative   Speech/Thought Process: Coherent   Insight: Fair   Judgement: Fair    Modes of Intervention: Clarification, Education, Exploration, and Music   Patient Response to Interventions:  Attentive, Engaged, and Receptive   Education Outcome:  Acknowledges education   Clinical Observations/Individualized Feedback: Ronald Banks was active in their participation of session activities and group discussion. Pt identified walk my dog and ride 4 wheelers as things he does in his free time. Pt chose to play with playdoh in group.    Plan: Continue to engage patient in RT group sessions 2-3x/week.   Jeoffrey BRAVO Celestia, LRT, CTRS 11/09/2023 1:16 PM

## 2023-11-09 NOTE — Plan of Care (Signed)
  Problem: Education: Goal: Emotional status will improve Outcome: Progressing Goal: Mental status will improve Outcome: Progressing Goal: Verbalization of understanding the information provided will improve Outcome: Progressing   Problem: Safety: Goal: Periods of time without injury will increase Outcome: Progressing   

## 2023-11-09 NOTE — Inpatient Diabetes Management (Signed)
 Inpatient Diabetes Program Recommendations  AACE/ADA: New Consensus Statement on Inpatient Glycemic Control (2015)  Target Ranges:  Prepandial:   less than 140 mg/dL      Peak postprandial:   less than 180 mg/dL (1-2 hours)      Critically ill patients:  140 - 180 mg/dL    Latest Reference Range & Units 11/08/23 07:32 11/08/23 11:48 11/08/23 16:22 11/08/23 20:01 11/08/23 22:53  Glucose-Capillary 70 - 99 mg/dL 839 (H)  5 units Novolog   18 units Lantus  332 (H)  10 units Novolog   191 (H)  7 units Novolog   282 (H)  3 units Novolog   287 (H)  (H  Latest Reference Range & Units 11/09/23 07:31 11/09/23 11:25  Glucose-Capillary 70 - 99 mg/dL 761 (H)  8 units Novolog   18 units Lantus 391 (H)  (H): Data is abnormally high  History: Type 1 diabetes   Home DM Meds:  T-Slim insulin  pump with Novolog  (total basal 34.3 units/24 hours; I:CR 1:8 grams; I:SF 1:40 mg/dl) Dexcom G7 CGM   Current Orders:  Lantus 18 units daily Novolog  Sensitive Correction Scale/ SSI (0-9 units) TID AC + HS Novolog  5 units TID with meals    MD- Please consider:  1. Increase Lantus to 20 units daily  2. Increase Novolog  Meal Coverage to 8 units TID with meals    --Will follow patient during hospitalization--  Adina Rudolpho Arrow RN, MSN, CDCES Diabetes Coordinator Inpatient Glycemic Control Team Team Pager: [phone] (8a-5p)

## 2023-11-09 NOTE — [facility] Suicide Risk Assessment (Signed)
 [facility] INPATIENT:  Family/Significant Other Suicide Prevention Education  Suicide Prevention Education:  Contact Attempts: Darla Williamson, mother, [phone]  [phone] , (name of family member/significant other) has been identified by the patient as the family member/significant other with whom the patient will be residing, and identified as the person(s) who will aid the patient in the event of a mental health crisis.  With written consent from the patient, two attempts were made to provide suicide prevention education, prior to and/or following the patient's discharge.  We were unsuccessful in providing suicide prevention education.  A suicide education pamphlet was given to the patient to share with family/significant other.  Date and time of first attempt:11/08/2023 at 9:29AM  Date and time of second attempt: 11/09/2023 at 3:27PM  Sherryle JINNY Margo 11/09/2023, 3:26 PM

## 2023-11-09 NOTE — Progress Notes (Signed)
   11/09/23 1500  Psych Admission Type (Psych Patients Only)  Admission Status Involuntary  Psychosocial Assessment  Patient Complaints None  Eye Contact Fair  Facial Expression Animated  Affect Euphoric  Speech Logical/coherent  Interaction Assertive  Motor Activity Pacing  Appearance/Hygiene In scrubs  Behavior Characteristics Cooperative  Mood Pleasant  Thought Process  Coherency WDL  Content WDL  Delusions None reported or observed  Perception WDL  Hallucination None reported or observed  Judgment Poor  Confusion None  Danger to Self  Current suicidal ideation? Denies  Danger to Others  Danger to Others None reported or observed

## 2023-11-09 NOTE — Progress Notes (Signed)
   11/09/23 0347  Psych Admission Type (Psych Patients Only)  Admission Status Involuntary  Psychosocial Assessment  Patient Complaints None  Eye Contact Fair  Facial Expression Animated  Affect Appropriate to circumstance  Speech Logical/coherent  Interaction Assertive  Motor Activity Fidgety  Appearance/Hygiene In scrubs  Behavior Characteristics Cooperative  Mood Pleasant  Aggressive Behavior  Effect No apparent injury  Thought Process  Coherency WDL  Content WDL  Delusions None reported or observed  Perception WDL  Hallucination None reported or observed  Judgment Poor  Confusion None  Danger to Self  Current suicidal ideation? Denies  Danger to Others  Danger to Others None reported or observed

## 2023-11-09 NOTE — Plan of Care (Signed)
   Problem: Education: Goal: Knowledge of [place] [facility] Education information/materials will improve Outcome: Progressing Goal: Emotional status will improve Outcome: Progressing Goal: Mental status will improve Outcome: Progressing Goal: Verbalization of understanding the information provided will improve Outcome: Progressing

## 2023-11-09 NOTE — Group Note (Signed)
 Date:  11/09/2023 Time:  8:56 PM  Group Topic/Focus:  Emotional Education:   The focus of this group is to discuss what feelings/emotions are, and how they are experienced.    Pt did not attend group.  Ronald Banks L 11/09/2023, 8:56 PM

## 2023-11-09 NOTE — Progress Notes (Signed)
 [facility] MD Progress Note  11/09/2023 3:50 PM Ronald Banks  MRN:  [id number]  Ronald Banks is a pleasant 48 y.o. male with medical history significant for type 1 diabetes on insulin  pump, HTN, HLD, hypothyroidism, depression, tobacco dependence, polysubstance abuse who presented to ED today via private vehicle for acute change in mental status/unresponsiveness in the vehicle.  Patient was driven by a friend who was with the patient throughout the night.  Patient was  in his normal state until last night.  Patient and his friend were drinking out and did not feel well and wanted to be taken home.  While the friend was driving him home he became unresponsive and the friend drove him to the emergency room at [facility]. He did endorse intentionally taking insulin  prior to his arrival to the emergency department in an attempt to kill self. He reported he did not tell his friend what he did and that his friend drove him to the emergency department because he became unresponsive. He endorsed homicidal ideations but would not elaborate on who or if he had a plan. He did report the homicidal ideations were due to a certain situation Patient is admitted to adult psych unit with Q15 min safety monitoring. Multidisciplinary team approach is offered. Medication management; group/milieu therapy is offered.  Subjective:  Chart reviewed, case discussed in multidisciplinary meeting, patient seen during rounds.  Patient is noted to be doing puzzles in the dayroom and mingling with other peers.  He offers no complaints.  Per nursing patient is not displaying any behavioral problems.  He is noted to be in bright mood and interacting with peers with no problems.  He did expressed his concern about his blood sugars not being under control and needing the insulin  at least 20 minutes before his diet.  Communicated with the nursing about the insulin  coverage.  Patient denies auditory/visual hallucinations.  Patient denies  suicidal/homicidal ideation.  Patient has fair appetite and sleep.  Sleep: Fair  Appetite:  Fair  Past Psychiatric History: see h&P Family History:  Family History  Problem Relation Age of Onset   Diabetes Mother    Cancer Mother        Brain, breast    Diabetes Sister    Diabetes Maternal Grandmother    Social History:  Social History   Substance and Sexual Activity  Alcohol Use Yes     Social History   Substance and Sexual Activity  Drug Use Yes   Types: Marijuana   Comment: Several times per week    Social History   Socioeconomic History   Marital status: Single    Spouse name: Not on file   Number of children: Not on file   Years of education: 8   Highest education level: 8th grade  Occupational History   Not on file  Tobacco Use   Smoking status: Every Day    Average packs/day: 1.5 packs/day for 31.0 years (46.0 ttl pk-yrs)    Types: Cigarettes    Start date: 2024   Smokeless tobacco: Never  Vaping Use   Vaping status: Never Used  Substance and Sexual Activity   Alcohol use: Yes   Drug use: Yes    Types: Marijuana    Comment: Several times per week   Sexual activity: Yes  Other Topics Concern   Not on file  Social History Narrative   Right handed    Caffeine 2 cups per day    Lives at home with his mom  Social Drivers of Corporate investment banker Strain: Low Risk  (05/02/2023)   Overall Financial Resource Strain (CARDIA)    Difficulty of Paying Living Expenses: Not very hard  Food Insecurity: No Food Insecurity (11/06/2023)   Hunger Vital Sign    Worried About Running Out of Food in the Last Year: Never true    Ran Out of Food in the Last Year: Never true  Transportation Needs: No Transportation Needs (11/06/2023)   PRAPARE - Administrator, Civil Service (Medical): No    Lack of Transportation (Non-Medical): No  Physical Activity: Inactive (05/02/2023)   Exercise Vital Sign    Days of Exercise per Week: 0 days    Minutes of  Exercise per Session: 0 min  Stress: Not on file  Social Connections: Socially Isolated (10/31/2023)   Social Connection and Isolation Panel    Frequency of Communication with Friends and Family: Three times a week    Frequency of Social Gatherings with Friends and Family: Twice a week    Attends Religious Services: Never    Database administrator or Organizations: No    Attends Engineer, structural: Never    Marital Status: Never married   Past Medical History:  Past Medical History:  Diagnosis Date   ADHD (attention deficit hyperactivity disorder), inattentive type    Chronic midline low back pain with right-sided sciatica 02/11/2018   Chronic right shoulder pain 02/11/2018   DKA (diabetic ketoacidosis) 08/17/2020   DOE (dyspnea on exertion) 05/19/2019   Onset 2015/16 05/19/2019   Walked RA x two laps =  approx 514ft @ brisk pace - stopped due to end of study with sats of 98 % at the end of the study and c/o sob s cp  > rec CPST next  - alpha one AT  Screen   05/19/19 MM  152  - CPST  06/30/19  FEV1 3.79 (93%) with ratio 0.73 and nl f/v loop: ex study  probabably  wnl when wt factored  In  - sob with ventilatory reserve suggested sub max effort and de   Hyperlipidemia due to type 1 diabetes mellitus 04/18/2018   Hypertension associated with diabetes 04/18/2018   Trial off acei 04/07/2019 due to cough > improved by 07/03/2019 with just a typical smoker's rattle (no longer cough to point of gag/vomit)   Hypothyroidism    Major depressive disorder 10/09/2019   Moderate nonproliferative diabetic retinopathy of both eyes (HCC) 05/19/2019   Tobacco dependence 10/09/2019   Type 1 diabetes mellitus with diabetic polyneuropathy 04/18/2018    Past Surgical History:  Procedure Laterality Date   DENTAL SURGERY     EAR CYST EXCISION Right 03/24/2020   Procedure: EXCISION PRE-AURICULAR CYST;  Surgeon: Milissa Hamming, MD;  Location: [facility];  Service: ENT;  Laterality: Right;   diabetic insulin  pump   PREAURICULAR CYST EXCISION Left 07/07/2020   Procedure: EXCISION PREAURICULAR CYST ADULT;  Surgeon: Milissa Hamming, MD;  Location: [facility];  Service: ENT;  Laterality: Left;    Current Medications: Current Facility-Administered Medications  Medication Dose Route Frequency Provider Last Rate Last Admin   alum & mag hydroxide-simeth (MAALOX/MYLANTA) 200-200-20 MG/5ML suspension 30 mL  30 mL Oral Q4H PRN Smith, Annie B, NP       atorvastatin  (LIPITOR) tablet 10 mg  10 mg Oral Daily Smith, Annie B, NP   10 mg at 11/08/23 2131   haloperidol (HALDOL) tablet 5 mg  5 mg Oral TID PRN  Claudene Zelda NOVAK, NP       And   diphenhydrAMINE  (BENADRYL ) capsule 50 mg  50 mg Oral TID PRN Smith, Annie B, NP       haloperidol lactate (HALDOL) injection 5 mg  5 mg Intramuscular TID PRN Smith, Annie B, NP       And   diphenhydrAMINE  (BENADRYL ) injection 50 mg  50 mg Intramuscular TID PRN Smith, Annie B, NP       And   LORazepam (ATIVAN) injection 2 mg  2 mg Intramuscular TID PRN Smith, Annie B, NP       haloperidol lactate (HALDOL) injection 10 mg  10 mg Intramuscular TID PRN Smith, Annie B, NP       And   diphenhydrAMINE  (BENADRYL ) injection 50 mg  50 mg Intramuscular TID PRN Smith, Annie B, NP       And   LORazepam (ATIVAN) injection 2 mg  2 mg Intramuscular TID PRN Smith, Annie B, NP       DULoxetine  (CYMBALTA ) DR capsule 90 mg  90 mg Oral Daily Damyn Weitzel, MD   90 mg at 11/09/23 0850   folic acid (FOLVITE) tablet 1 mg  1 mg Oral Daily Smith, Annie B, NP   1 mg at 11/09/23 9149   insulin  aspart (novoLOG ) injection 0-5 Units  0-5 Units Subcutaneous QHS Smith, Annie B, NP   3 Units at 11/08/23 2129   insulin  aspart (novoLOG ) injection 0-9 Units  0-9 Units Subcutaneous TID WC Smith, Annie B, NP   7 Units at 11/09/23 1144   insulin  aspart (novoLOG ) injection 8 Units  8 Units Subcutaneous TID WC Rupa Lagan, MD       NOREEN ON 11/10/2023] insulin  glargine (LANTUS)  injection 20 Units  20 Units Subcutaneous Daily Alize Acy, MD       levothyroxine  (SYNTHROID ) tablet 250 mcg  250 mcg Oral Daily Smith, Annie B, NP   250 mcg at 11/09/23 0615   magnesium hydroxide (MILK OF MAGNESIA) suspension 30 mL  30 mL Oral Daily PRN Smith, Annie B, NP       multivitamin with minerals tablet 1 tablet  1 tablet Oral Daily Smith, Annie B, NP   1 tablet at 11/09/23 0850   oxyCODONE  (Oxy IR/ROXICODONE ) immediate release tablet 5 mg  5 mg Oral Q8H PRN Smith, Annie B, NP   5 mg at 11/09/23 9385   pantoprazole  (PROTONIX ) EC tablet 40 mg  40 mg Oral Daily Smith, Annie B, NP   40 mg at 11/09/23 0850   thiamine (VITAMIN B1) tablet 100 mg  100 mg Oral Daily Smith, Annie B, NP   100 mg at 11/09/23 9149   Or   thiamine (VITAMIN B1) injection 100 mg  100 mg Intravenous Daily Smith, Annie B, NP        Lab Results:  Results for orders placed or performed during the hospital encounter of 11/06/23 (from the past 48 hours)  Glucose, capillary     Status: Abnormal   Collection Time: 11/07/23  4:33 PM  Result Value Ref Range   Glucose-Capillary 133 (H) 70 - 99 mg/dL    Comment: Glucose reference range applies only to samples taken after fasting for at least 8 hours.  Glucose, capillary     Status: Abnormal   Collection Time: 11/07/23  7:52 PM  Result Value Ref Range   Glucose-Capillary 346 (H) 70 - 99 mg/dL    Comment: Glucose reference range applies only to samples taken after  fasting for at least 8 hours.  Glucose, capillary     Status: Abnormal   Collection Time: 11/08/23  7:32 AM  Result Value Ref Range   Glucose-Capillary 160 (H) 70 - 99 mg/dL    Comment: Glucose reference range applies only to samples taken after fasting for at least 8 hours.   Comment 1 Notify RN   Glucose, capillary     Status: Abnormal   Collection Time: 11/08/23 11:48 AM  Result Value Ref Range   Glucose-Capillary 332 (H) 70 - 99 mg/dL    Comment: Glucose reference range applies only to samples taken  after fasting for at least 8 hours.   Comment 1 Notify RN   Glucose, capillary     Status: Abnormal   Collection Time: 11/08/23  4:22 PM  Result Value Ref Range   Glucose-Capillary 191 (H) 70 - 99 mg/dL    Comment: Glucose reference range applies only to samples taken after fasting for at least 8 hours.  Glucose, capillary     Status: Abnormal   Collection Time: 11/08/23  8:01 PM  Result Value Ref Range   Glucose-Capillary 282 (H) 70 - 99 mg/dL    Comment: Glucose reference range applies only to samples taken after fasting for at least 8 hours.  Glucose, capillary     Status: Abnormal   Collection Time: 11/08/23 10:53 PM  Result Value Ref Range   Glucose-Capillary 287 (H) 70 - 99 mg/dL    Comment: Glucose reference range applies only to samples taken after fasting for at least 8 hours.  Glucose, capillary     Status: Abnormal   Collection Time: 11/09/23  7:31 AM  Result Value Ref Range   Glucose-Capillary 238 (H) 70 - 99 mg/dL    Comment: Glucose reference range applies only to samples taken after fasting for at least 8 hours.  Glucose, capillary     Status: Abnormal   Collection Time: 11/09/23 11:25 AM  Result Value Ref Range   Glucose-Capillary 391 (H) 70 - 99 mg/dL    Comment: Glucose reference range applies only to samples taken after fasting for at least 8 hours.    Blood Alcohol level:  Lab Results  Component Value Date   ETH 112 (H) 10/31/2023   ETH <10 11/25/2022    Metabolic Disorder Labs: Lab Results  Component Value Date   HGBA1C 9.8 (H) 10/31/2023   MPG 234.56 10/31/2023   MPG 148 08/17/2020   No results found for: PROLACTIN Lab Results  Component Value Date   CHOL 226 (H) 09/11/2018   TRIG 89 09/11/2018   HDL 65 09/11/2018   CHOLHDL 3.5 09/11/2018   LDLCALC 143 (H) 09/11/2018   LDLCALC 214 (H) 03/04/2018    Physical Findings: AIMS:  , ,  ,  ,    CIWA:    COWS:      Psychiatric Specialty Exam:  Presentation  General Appearance:   Casual  Eye Contact: Fair  Speech: Clear and Coherent  Speech Volume: Normal    Mood and Affect  Mood: Euthymic  Affect: Appropriate   Thought Process  Thought Processes: Coherent  Descriptions of Associations:Intact  Orientation:Full (Time, Place and Person)  Thought Content: Coherent Hallucinations: Denies  Ideas of Reference:None  Suicidal Thoughts: Denies  Homicidal Thoughts: Denies   Sensorium  Memory: Immediate Fair; Recent Fair  Judgment: Improving Insight: Improving  Executive Functions  Concentration: Fair  Attention Span: Fair  Recall: [employer] of Knowledge: Fair  Language: Fair  Psychomotor Activity  Psychomotor Activity: No data recorded  Musculoskeletal: Strength & Muscle Tone: within normal limits Gait & Station: normal Assets  Assets: Manufacturing systems engineer; Desire for Improvement; Physical Health; Resilience    Physical Exam: Physical Exam Vitals and nursing note reviewed.    ROS Blood pressure 130/68, pulse (!) 56, temperature 98.3 F (36.8 C), temperature source Oral, resp. rate 18, height 5' 10 (1.778 m), weight 79.8 kg, SpO2 98%. Body mass index is 25.25 kg/m.  Diagnosis: Active Problems:   Adjustment disorder with mixed anxiety and depressed mood  Clinical Decision Making: Patient is currently admitted after insulin  overdose and critical hypoglycemia and he did acknowledge it as suicide attempt in ED.Since admission patient remains superficial and minimally engaged in treatment plan.  Treatment Plan Summary:  Safety and Monitoring:             -- Voluntary admission to inpatient psychiatric unit for safety, stabilization and treatment             -- Daily contact with patient to assess and evaluate symptoms and progress in treatment             -- Patient's case to be discussed in multi-disciplinary team meeting             -- Observation Level: q15 minute checks             -- Vital signs:   q12 hours             -- Precautions: suicide, elopement, and assault   2. Psychiatric Diagnoses and Treatment:                cymbalta  increased to 90 mg   -- The risks/benefits/side-effects/alternatives to this medication were discussed in detail with the patient and time was given for questions. The patient consents to medication trial.                -- Metabolic profile and EKG monitoring obtained while on an atypical antipsychotic (BMI: Lipid Panel: HbgA1c: QTc:)              -- Encouraged patient to participate in unit milieu and in scheduled group therapies               4. Discharge Planning:   -- Social work and case management to assist with discharge planning and identification of hospital follow-up needs prior to discharge  -- Estimated LOS: 3-4 days  Allyn Foil, MD 11/09/2023, 3:50 PM

## 2023-11-10 DIAGNOSIS — F4323 Adjustment disorder with mixed anxiety and depressed mood: Secondary | ICD-10-CM | POA: Diagnosis not present

## 2023-11-10 LAB — GLUCOSE, CAPILLARY
Glucose-Capillary: 221 mg/dL — ABNORMAL HIGH (ref 70–99)
Glucose-Capillary: 96 mg/dL (ref 70–99)
Glucose-Capillary: 326 mg/dL — ABNORMAL HIGH (ref 70–99)

## 2023-11-10 NOTE — Progress Notes (Signed)
   11/10/23 0850  Psych Admission Type (Psych Patients Only)  Admission Status Involuntary  Psychosocial Assessment  Patient Complaints None  Eye Contact Fair  Facial Expression Flat  Affect Appropriate to circumstance  Speech Logical/coherent  Interaction Assertive  Motor Activity Restless  Appearance/Hygiene In scrubs  Behavior Characteristics Cooperative  Mood Labile  Aggressive Behavior  Effect No apparent injury  Thought Process  Coherency WDL  Content WDL  Delusions None reported or observed  Perception WDL  Hallucination None reported or observed  Judgment Poor  Confusion None  Danger to Self  Current suicidal ideation? Denies  Danger to Others  Danger to Others None reported or observed

## 2023-11-10 NOTE — Progress Notes (Signed)
   11/09/23 2000  Psych Admission Type (Psych Patients Only)  Admission Status Involuntary  Psychosocial Assessment  Patient Complaints None  Eye Contact Fair  Facial Expression Flat  Affect Appropriate to circumstance;Inconsistent with thought content  Speech Logical/coherent  Interaction Assertive  Motor Activity Pacing  Appearance/Hygiene In scrubs  Behavior Characteristics Cooperative  Mood Depressed;Labile  Aggressive Behavior  Effect No apparent injury  Thought Process  Coherency WDL  Content WDL  Delusions None reported or observed  Perception WDL  Hallucination None reported or observed  Judgment Poor  Confusion None  Danger to Self  Current suicidal ideation? Denies  Danger to Others  Danger to Others None reported or observed

## 2023-11-10 NOTE — Group Note (Signed)
 Date:  11/10/2023 Time:  9:55 PM  Group Topic/Focus:  Wrap-Up Group:   The focus of this group is to help patients review their daily goal of treatment and discuss progress on daily workbooks.    Participation Level:  Did Not Attend  Participation Quality:  none  Affect:  none  Cognitive:  none  Insight: None  Engagement in Group:  none  Modes of Intervention:  none  Additional Comments:  none   Kerri Katz 11/10/2023, 9:55 PM

## 2023-11-10 NOTE — Progress Notes (Cosign Needed)
 [facility] MD Progress Note  11/10/2023 1:54 PM Ronald Banks  MRN:  [id number]  Ronald Banks is a pleasant 48 y.o. male with medical history significant for type 1 diabetes on insulin  pump, HTN, HLD, hypothyroidism, depression, tobacco dependence, polysubstance abuse who presented to ED today via private vehicle for acute change in mental status/unresponsiveness in the vehicle.  Patient was driven by a friend who was with the patient throughout the night.  Patient was  in his normal state until last night.  Patient and his friend were drinking out and did not feel well and wanted to be taken home.  While the friend was driving him home he became unresponsive and the friend drove him to the emergency room at [facility]. He did endorse intentionally taking insulin  prior to his arrival to the emergency department in an attempt to kill self. He reported he did not tell his friend what he did and that his friend drove him to the emergency department because he became unresponsive. He endorsed homicidal ideations but would not elaborate on who or if he had a plan. He did report the homicidal ideations were due to a certain situation Patient is admitted to adult psych unit with Q15 min safety monitoring. Multidisciplinary team approach is offered. Medication management; group/milieu therapy is offered.   Subjective:  Chart reviewed, case discussed in multidisciplinary meeting, patient seen during rounds.    10/18 Patient is assessed today on the inpatient unit and he is found resting in bed. He continues to minimize her depressive symptoms and states that he is unaware of what he stated on his admission regarding suicidal thoughts  10/17 Patient is noted to be doing puzzles in the dayroom and mingling with other peers.  He offers no complaints.  Per nursing patient is not displaying any behavioral problems.  He is noted to be in bright mood and interacting with peers with no problems.  He did expressed his concern  about his blood sugars not being under control and needing the insulin  at least 20 minutes before his diet.  Communicated with the nursing about the insulin  coverage.  Patient denies auditory/visual hallucinations.  Patient denies suicidal/homicidal ideation.  Patient has fair appetite and sleep.  Sleep: Fair  Appetite:  Fair  Past Psychiatric History: see h&P Family History:  Family History  Problem Relation Age of Onset   Diabetes Mother    Cancer Mother        Brain, breast    Diabetes Sister    Diabetes Maternal Grandmother    Social History:  Social History   Substance and Sexual Activity  Alcohol Use Yes     Social History   Substance and Sexual Activity  Drug Use Yes   Types: Marijuana   Comment: Several times per week    Social History   Socioeconomic History   Marital status: Single    Spouse name: Not on file   Number of children: Not on file   Years of education: 8   Highest education level: 8th grade  Occupational History   Not on file  Tobacco Use   Smoking status: Every Day    Average packs/day: 1.5 packs/day for 31.0 years (46.0 ttl pk-yrs)    Types: Cigarettes    Start date: 2024   Smokeless tobacco: Never  Vaping Use   Vaping status: Never Used  Substance and Sexual Activity   Alcohol use: Yes   Drug use: Yes    Types: Marijuana  Comment: Several times per week   Sexual activity: Yes  Other Topics Concern   Not on file  Social History Narrative   Right handed    Caffeine 2 cups per day    Lives at home with his mom    Social Drivers of Health   Financial Resource Strain: Low Risk  (05/02/2023)   Overall Financial Resource Strain (CARDIA)    Difficulty of Paying Living Expenses: Not very hard  Food Insecurity: No Food Insecurity (11/06/2023)   Hunger Vital Sign    Worried About Running Out of Food in the Last Year: Never true    Ran Out of Food in the Last Year: Never true  Transportation Needs: No Transportation Needs (11/06/2023)    PRAPARE - Administrator, Civil Service (Medical): No    Lack of Transportation (Non-Medical): No  Physical Activity: Inactive (05/02/2023)   Exercise Vital Sign    Days of Exercise per Week: 0 days    Minutes of Exercise per Session: 0 min  Stress: Not on file  Social Connections: Socially Isolated (10/31/2023)   Social Connection and Isolation Panel    Frequency of Communication with Friends and Family: Three times a week    Frequency of Social Gatherings with Friends and Family: Twice a week    Attends Religious Services: Never    Database administrator or Organizations: No    Attends Engineer, structural: Never    Marital Status: Never married   Past Medical History:  Past Medical History:  Diagnosis Date   ADHD (attention deficit hyperactivity disorder), inattentive type    Chronic midline low back pain with right-sided sciatica 02/11/2018   Chronic right shoulder pain 02/11/2018   DKA (diabetic ketoacidosis) 08/17/2020   DOE (dyspnea on exertion) 05/19/2019   Onset 2015/16 05/19/2019   Walked RA x two laps =  approx 582ft @ brisk pace - stopped due to end of study with sats of 98 % at the end of the study and c/o sob s cp  > rec CPST next  - alpha one AT  Screen   05/19/19 MM  152  - CPST  06/30/19  FEV1 3.79 (93%) with ratio 0.73 and nl f/v loop: ex study  probabably  wnl when wt factored  In  - sob with ventilatory reserve suggested sub max effort and de   Hyperlipidemia due to type 1 diabetes mellitus 04/18/2018   Hypertension associated with diabetes 04/18/2018   Trial off acei 04/07/2019 due to cough > improved by 07/03/2019 with just a typical smoker's rattle (no longer cough to point of gag/vomit)   Hypothyroidism    Major depressive disorder 10/09/2019   Moderate nonproliferative diabetic retinopathy of both eyes (HCC) 05/19/2019   Tobacco dependence 10/09/2019   Type 1 diabetes mellitus with diabetic polyneuropathy 04/18/2018    Past Surgical History:   Procedure Laterality Date   DENTAL SURGERY     EAR CYST EXCISION Right 03/24/2020   Procedure: EXCISION PRE-AURICULAR CYST;  Surgeon: Milissa Hamming, MD;  Location: [facility];  Service: ENT;  Laterality: Right;  diabetic insulin  pump   PREAURICULAR CYST EXCISION Left 07/07/2020   Procedure: EXCISION PREAURICULAR CYST ADULT;  Surgeon: Milissa Hamming, MD;  Location: [facility];  Service: ENT;  Laterality: Left;    Current Medications: Current Facility-Administered Medications  Medication Dose Route Frequency Provider Last Rate Last Admin   alum & mag hydroxide-simeth (MAALOX/MYLANTA) 200-200-20 MG/5ML suspension 30 mL  30 mL  Oral Q4H PRN Smith, Annie B, NP       atorvastatin  (LIPITOR) tablet 10 mg  10 mg Oral Daily Smith, Annie B, NP   10 mg at 11/09/23 2000   haloperidol (HALDOL) tablet 5 mg  5 mg Oral TID PRN Smith, Annie B, NP       And   diphenhydrAMINE  (BENADRYL ) capsule 50 mg  50 mg Oral TID PRN Smith, Annie B, NP       haloperidol lactate (HALDOL) injection 5 mg  5 mg Intramuscular TID PRN Smith, Annie B, NP       And   diphenhydrAMINE  (BENADRYL ) injection 50 mg  50 mg Intramuscular TID PRN Smith, Annie B, NP       And   LORazepam (ATIVAN) injection 2 mg  2 mg Intramuscular TID PRN Smith, Annie B, NP       haloperidol lactate (HALDOL) injection 10 mg  10 mg Intramuscular TID PRN Smith, Annie B, NP       And   diphenhydrAMINE  (BENADRYL ) injection 50 mg  50 mg Intramuscular TID PRN Smith, Annie B, NP       And   LORazepam (ATIVAN) injection 2 mg  2 mg Intramuscular TID PRN Smith, Annie B, NP       DULoxetine  (CYMBALTA ) DR capsule 90 mg  90 mg Oral Daily Jadapalle, Sree, MD   90 mg at 11/10/23 0849   folic acid (FOLVITE) tablet 1 mg  1 mg Oral Daily Smith, Annie B, NP   1 mg at 11/10/23 9150   insulin  aspart (novoLOG ) injection 0-5 Units  0-5 Units Subcutaneous QHS Smith, Annie B, NP   3 Units at 11/08/23 2129   insulin  aspart (novoLOG ) injection 0-9 Units  0-9  Units Subcutaneous TID WC Smith, Annie B, NP   2 Units at 11/10/23 9150   insulin  aspart (novoLOG ) injection 8 Units  8 Units Subcutaneous TID WC Jadapalle, Sree, MD   8 Units at 11/10/23 0856   insulin  glargine (LANTUS) injection 20 Units  20 Units Subcutaneous Daily Jadapalle, Sree, MD   20 Units at 11/10/23 0848   levothyroxine  (SYNTHROID ) tablet 250 mcg  250 mcg Oral Daily Smith, Annie B, NP   250 mcg at 11/10/23 0710   magnesium hydroxide (MILK OF MAGNESIA) suspension 30 mL  30 mL Oral Daily PRN Smith, Annie B, NP       multivitamin with minerals tablet 1 tablet  1 tablet Oral Daily Smith, Annie B, NP   1 tablet at 11/10/23 9150   oxyCODONE  (Oxy IR/ROXICODONE ) immediate release tablet 5 mg  5 mg Oral Q8H PRN Smith, Annie B, NP   5 mg at 11/10/23 1021   pantoprazole  (PROTONIX ) EC tablet 40 mg  40 mg Oral Daily Smith, Annie B, NP   40 mg at 11/10/23 9150   thiamine (VITAMIN B1) tablet 100 mg  100 mg Oral Daily Smith, Annie B, NP   100 mg at 11/10/23 9150   Or   thiamine (VITAMIN B1) injection 100 mg  100 mg Intravenous Daily Smith, Annie B, NP        Lab Results:  Results for orders placed or performed during the hospital encounter of 11/06/23 (from the past 48 hours)  Glucose, capillary     Status: Abnormal   Collection Time: 11/08/23  4:22 PM  Result Value Ref Range   Glucose-Capillary 191 (H) 70 - 99 mg/dL    Comment: Glucose reference range applies only to samples taken after  fasting for at least 8 hours.  Glucose, capillary     Status: Abnormal   Collection Time: 11/08/23  8:01 PM  Result Value Ref Range   Glucose-Capillary 282 (H) 70 - 99 mg/dL    Comment: Glucose reference range applies only to samples taken after fasting for at least 8 hours.  Glucose, capillary     Status: Abnormal   Collection Time: 11/08/23 10:53 PM  Result Value Ref Range   Glucose-Capillary 287 (H) 70 - 99 mg/dL    Comment: Glucose reference range applies only to samples taken after fasting for at least 8  hours.  Glucose, capillary     Status: Abnormal   Collection Time: 11/09/23  7:31 AM  Result Value Ref Range   Glucose-Capillary 238 (H) 70 - 99 mg/dL    Comment: Glucose reference range applies only to samples taken after fasting for at least 8 hours.  Glucose, capillary     Status: Abnormal   Collection Time: 11/09/23 11:25 AM  Result Value Ref Range   Glucose-Capillary 391 (H) 70 - 99 mg/dL    Comment: Glucose reference range applies only to samples taken after fasting for at least 8 hours.  Glucose, capillary     Status: Abnormal   Collection Time: 11/09/23  4:03 PM  Result Value Ref Range   Glucose-Capillary 349 (H) 70 - 99 mg/dL    Comment: Glucose reference range applies only to samples taken after fasting for at least 8 hours.  Glucose, capillary     Status: Abnormal   Collection Time: 11/09/23  9:47 PM  Result Value Ref Range   Glucose-Capillary 187 (H) 70 - 99 mg/dL    Comment: Glucose reference range applies only to samples taken after fasting for at least 8 hours.  Glucose, capillary     Status: Abnormal   Collection Time: 11/10/23  7:10 AM  Result Value Ref Range   Glucose-Capillary 221 (H) 70 - 99 mg/dL    Comment: Glucose reference range applies only to samples taken after fasting for at least 8 hours.    Blood Alcohol level:  Lab Results  Component Value Date   ETH 112 (H) 10/31/2023   ETH <10 11/25/2022    Metabolic Disorder Labs: Lab Results  Component Value Date   HGBA1C 9.8 (H) 10/31/2023   MPG 234.56 10/31/2023   MPG 148 08/17/2020   No results found for: PROLACTIN Lab Results  Component Value Date   CHOL 226 (H) 09/11/2018   TRIG 89 09/11/2018   HDL 65 09/11/2018   CHOLHDL 3.5 09/11/2018   LDLCALC 143 (H) 09/11/2018   LDLCALC 214 (H) 03/04/2018    Physical Findings: AIMS:  , ,  ,  ,    CIWA:    COWS:      Psychiatric Specialty Exam:  Presentation  General Appearance:  Appropriate for Environment  Eye  Contact: Minimal  Speech: Normal Rate  Speech Volume: Normal    Mood and Affect  Mood: Euthymic  Affect: Appropriate   Thought Process  Thought Processes: Linear; Coherent  Descriptions of Associations:Intact  Orientation:Full (Time, Place and Person)  Thought Content: Coherent Hallucinations: Denies  Ideas of Reference:None  Suicidal Thoughts: Denies  Homicidal Thoughts: Denies   Sensorium  Memory: Immediate Good; Recent Good; Remote Poor  Judgment: Improving Insight: Improving  Executive Functions  Concentration: Good  Attention Span: Good  Recall: Good  Fund of Knowledge: Good  Language: Good   Psychomotor Activity  Psychomotor Activity: Psychomotor Activity: Normal  Musculoskeletal: Strength & Muscle Tone: within normal limits Gait & Station: normal Assets  Assets: Housing    Physical Exam: Physical Exam Vitals and nursing note reviewed.    Review of Systems  Constitutional: Negative.   HENT: Negative.    Eyes: Negative.   Respiratory: Negative.    Cardiovascular: Negative.   Gastrointestinal: Negative.   Genitourinary: Negative.   Musculoskeletal:  Positive for back pain.  Skin: Negative.   Neurological: Negative.   Endo/Heme/Allergies: Negative.   Psychiatric/Behavioral: Negative.     Blood pressure 113/71, pulse (!) 54, temperature 98.1 F (36.7 C), temperature source Oral, resp. rate 20, height 5' 10 (1.778 m), weight 79.8 kg, SpO2 96%. Body mass index is 25.25 kg/m.  Diagnosis: Active Problems:   Adjustment disorder with mixed anxiety and depressed mood  Clinical Decision Making: Patient is currently admitted after insulin  overdose and critical hypoglycemia and he did acknowledge it as suicide attempt in ED.Since admission patient remains superficial and minimally engaged in treatment plan.  Treatment Plan Summary:  Safety and Monitoring:             -- Voluntary admission to inpatient psychiatric  unit for safety, stabilization and treatment             -- Daily contact with patient to assess and evaluate symptoms and progress in treatment             -- Patient's case to be discussed in multi-disciplinary team meeting             -- Observation Level: q15 minute checks             -- Vital signs:  q12 hours             -- Precautions: suicide, elopement, and assault   2. Psychiatric Diagnoses and Treatment:                continue cymbalta  90 mg   -- The risks/benefits/side-effects/alternatives to this medication were discussed in detail with the patient and time was given for questions. The patient consents to medication trial.                -- Metabolic profile and EKG monitoring obtained while on an atypical antipsychotic (BMI: Lipid Panel: HbgA1c: QTc:)              -- Encouraged patient to participate in unit milieu and in scheduled group therapies               4. Discharge Planning:   -- Social work and case management to assist with discharge planning and identification of hospital follow-up needs prior to discharge  -- Estimated LOS: 3-4 days  Daine KATHEE Ober, NP 11/10/2023, 1:54 PM

## 2023-11-10 NOTE — Plan of Care (Signed)

## 2023-11-10 NOTE — Plan of Care (Signed)
   Problem: Education: Goal: Knowledge of [facility] Education information/materials will improve Outcome: Progressing   Problem: Education: Goal: Emotional status will improve Outcome: Progressing   Problem: Education: Goal: Mental status will improve Outcome: Progressing   Problem: Education: Goal: Verbalization of understanding the information provided will improve Outcome: Progressing

## 2023-11-11 DIAGNOSIS — F4323 Adjustment disorder with mixed anxiety and depressed mood: Secondary | ICD-10-CM | POA: Diagnosis not present

## 2023-11-11 LAB — GLUCOSE, CAPILLARY
Glucose-Capillary: 352 mg/dL — ABNORMAL HIGH (ref 70–99)
Glucose-Capillary: 289 mg/dL — ABNORMAL HIGH (ref 70–99)
Glucose-Capillary: 171 mg/dL — ABNORMAL HIGH (ref 70–99)
Glucose-Capillary: 309 mg/dL — ABNORMAL HIGH (ref 70–99)
Glucose-Capillary: 331 mg/dL — ABNORMAL HIGH (ref 70–99)

## 2023-11-11 NOTE — Progress Notes (Signed)
 Pt's AC Lunch insulin  dose was initially held due to pt stating he was not going to eat lunch.  Staff reported that pt did eat lunch later, and when this writer asked the pt about this, he refused his corresponding lunchtime insulin  dose stating that he ate very little lunch and his BG had been already too low at 171.    Before dinner, CBG was 309.  Pt reported that he ate 100% of his dinner of burger and fries.  Pt accepted and received prescribed insulin  at that time.

## 2023-11-11 NOTE — Group Note (Signed)
 Date:  11/10/2023 Time:  10:30am  Group Topic/Focus:  Conflict Resolution:   The focus of this group is to discuss the conflict resolution process and how it may be used upon discharge.    Participation Level:  Did Not Attend  Participation Quality:    Affect:    Cognitive:    Insight:   Engagement in Group:    Modes of Intervention:    Additional Comments:    Bonnielee LOISE Pepper 11/11/2023, 7:15 PM

## 2023-11-11 NOTE — Plan of Care (Signed)
   Problem: Physical Regulation: Goal: Ability to maintain clinical measurements within normal limits will improve Outcome: Progressing   Problem: Safety: Goal: Periods of time without injury will increase Outcome: Progressing

## 2023-11-11 NOTE — Progress Notes (Signed)
 It has been reported that this patient made sexually inappropriate comments to another patient on the unit. The other patient reports that [name] has made several disturbing comments, including telling her, once you come back, I'm going to bend you over and tie you up. [name] also attempted to hug a male staff member and she advised him that hugs are not permitted. He was not pleased with her response. He was reminded of our unit rules. AC and provider was notified of the incident.

## 2023-11-11 NOTE — Progress Notes (Signed)
   11/11/23 2200  Psych Admission Type (Psych Patients Only)  Admission Status Involuntary  Psychosocial Assessment  Patient Complaints None  Eye Contact Fair  Facial Expression Animated  Affect Appropriate to circumstance  Speech Logical/coherent  Interaction Assertive  Motor Activity Restless  Appearance/Hygiene In scrubs  Behavior Characteristics Cooperative  Mood Anxious  Thought Process  Coherency WDL  Content WDL  Delusions None reported or observed  Perception WDL  Hallucination None reported or observed  Judgment Poor  Confusion None  Danger to Self  Current suicidal ideation? Denies  Danger to Others  Danger to Others None reported or observed

## 2023-11-11 NOTE — Progress Notes (Signed)
 [facility] MD Progress Note  11/11/2023 1:43 PM Ronald Banks  MRN:  [id number]  Ronald Banks is a pleasant 48 y.o. male with medical history significant for type 1 diabetes on insulin  pump, HTN, HLD, hypothyroidism, depression, tobacco dependence, polysubstance abuse who presented to ED today via private vehicle for acute change in mental status/unresponsiveness in the vehicle.  Patient was driven by a friend who was with the patient throughout the night.  Patient was  in his normal state until last night.  Patient and his friend were drinking out and did not feel well and wanted to be taken home.  While the friend was driving him home he became unresponsive and the friend drove him to the emergency room at [facility]. He did endorse intentionally taking insulin  prior to his arrival to the emergency department in an attempt to kill self. He reported he did not tell his friend what he did and that his friend drove him to the emergency department because he became unresponsive. He endorsed homicidal ideations but would not elaborate on who or if he had a plan. He did report the homicidal ideations were due to a certain situation Patient is admitted to adult psych unit with Q15 min safety monitoring. Multidisciplinary team approach is offered. Medication management; group/milieu therapy is offered.   Subjective:  Chart reviewed, case discussed in multidisciplinary meeting, patient seen during rounds.    10/19 Patient is assessed in the inpatient unit. He did eat lunch today after much encouragement to do so. He continues to deny depressive and anxiety symptoms and reports that he is ready to go home to his dogs. He has been attending some group sessions and engages minimally on the unit. He states that he is sleeping the time away until I can go home. [facility] does not conduct P2P meetings on the weekend and will be schedule for Monday. He again offers no concerns today and reports that duloxetine  is effective for  him. He has been compliant with medication plan. Denies SI, HI, AVH at this time.   10/18 Patient is assessed today on the inpatient unit and he is found resting in bed. He continues to minimize her depressive symptoms and states that he is unaware of what he stated on his admission regarding suicidal thoughts and reports that he could not be responsible for what he said during the hypoglycemia. He states that he is doing well and offers no concerns today. He engages minimally on the unit. There has been no behavioral concerns and no self injurious behaviors. Denies SI, HI, AVH.   10/17 Patient is noted to be doing puzzles in the dayroom and mingling with other peers.  He offers no complaints.  Per nursing patient is not displaying any behavioral problems.  He is noted to be in bright mood and interacting with peers with no problems.  He did expressed his concern about his blood sugars not being under control and needing the insulin  at least 20 minutes before his diet.  Communicated with the nursing about the insulin  coverage.  Patient denies auditory/visual hallucinations.  Patient denies suicidal/homicidal ideation.  Patient has fair appetite and sleep.  Sleep: Fair  Appetite:  Fair  Past Psychiatric History: see h&P Family History:  Family History  Problem Relation Age of Onset   Diabetes Mother    Cancer Mother        Brain, breast    Diabetes Sister    Diabetes Maternal Grandmother    Social History:  Social History   Substance and Sexual Activity  Alcohol Use Yes     Social History   Substance and Sexual Activity  Drug Use Yes   Types: Marijuana   Comment: Several times per week    Social History   Socioeconomic History   Marital status: Single    Spouse name: Not on file   Number of children: Not on file   Years of education: 8   Highest education level: 8th grade  Occupational History   Not on file  Tobacco Use   Smoking status: Every Day    Average packs/day: 1.5  packs/day for 31.0 years (46.0 ttl pk-yrs)    Types: Cigarettes    Start date: 2024   Smokeless tobacco: Never  Vaping Use   Vaping status: Never Used  Substance and Sexual Activity   Alcohol use: Yes   Drug use: Yes    Types: Marijuana    Comment: Several times per week   Sexual activity: Yes  Other Topics Concern   Not on file  Social History Narrative   Right handed    Caffeine 2 cups per day    Lives at home with his mom    Social Drivers of Health   Financial Resource Strain: Low Risk  (05/02/2023)   Overall Financial Resource Strain (CARDIA)    Difficulty of Paying Living Expenses: Not very hard  Food Insecurity: No Food Insecurity (11/06/2023)   Hunger Vital Sign    Worried About Running Out of Food in the Last Year: Never true    Ran Out of Food in the Last Year: Never true  Transportation Needs: No Transportation Needs (11/06/2023)   PRAPARE - Administrator, Civil Service (Medical): No    Lack of Transportation (Non-Medical): No  Physical Activity: Inactive (05/02/2023)   Exercise Vital Sign    Days of Exercise per Week: 0 days    Minutes of Exercise per Session: 0 min  Stress: Not on file  Social Connections: Socially Isolated (10/31/2023)   Social Connection and Isolation Panel    Frequency of Communication with Friends and Family: Three times a week    Frequency of Social Gatherings with Friends and Family: Twice a week    Attends Religious Services: Never    Database administrator or Organizations: No    Attends Engineer, structural: Never    Marital Status: Never married   Past Medical History:  Past Medical History:  Diagnosis Date   ADHD (attention deficit hyperactivity disorder), inattentive type    Chronic midline low back pain with right-sided sciatica 02/11/2018   Chronic right shoulder pain 02/11/2018   DKA (diabetic ketoacidosis) 08/17/2020   DOE (dyspnea on exertion) 05/19/2019   Onset 2015/16 05/19/2019   Walked RA x two  laps =  approx 550ft @ brisk pace - stopped due to end of study with sats of 98 % at the end of the study and c/o sob s cp  > rec CPST next  - alpha one AT  Screen   05/19/19 MM  152  - CPST  06/30/19  FEV1 3.79 (93%) with ratio 0.73 and nl f/v loop: ex study  probabably  wnl when wt factored  In  - sob with ventilatory reserve suggested sub max effort and de   Hyperlipidemia due to type 1 diabetes mellitus 04/18/2018   Hypertension associated with diabetes 04/18/2018   Trial off acei 04/07/2019 due to cough > improved by 07/03/2019 with  just a typical smoker's rattle (no longer cough to point of gag/vomit)   Hypothyroidism    Major depressive disorder 10/09/2019   Moderate nonproliferative diabetic retinopathy of both eyes (HCC) 05/19/2019   Tobacco dependence 10/09/2019   Type 1 diabetes mellitus with diabetic polyneuropathy 04/18/2018    Past Surgical History:  Procedure Laterality Date   DENTAL SURGERY     EAR CYST EXCISION Right 03/24/2020   Procedure: EXCISION PRE-AURICULAR CYST;  Surgeon: Milissa Hamming, MD;  [facility];  Service: ENT;  Laterality: Right;  diabetic insulin  pump   PREAURICULAR CYST EXCISION Left 07/07/2020   Procedure: EXCISION PREAURICULAR CYST ADULT;  Surgeon: Milissa Hamming, MD;  Location: [facility];  Service: ENT;  Laterality: Left;    Current Medications: Current Facility-Administered Medications  Medication Dose Route Frequency Provider Last Rate Last Admin   alum & mag hydroxide-simeth (MAALOX/MYLANTA) 200-200-20 MG/5ML suspension 30 mL  30 mL Oral Q4H PRN Smith, Annie B, NP       atorvastatin  (LIPITOR) tablet 10 mg  10 mg Oral Daily Smith, Annie B, NP   10 mg at 11/10/23 2039   haloperidol (HALDOL) tablet 5 mg  5 mg Oral TID PRN Smith, Annie B, NP       And   diphenhydrAMINE  (BENADRYL ) capsule 50 mg  50 mg Oral TID PRN Smith, Annie B, NP       haloperidol lactate (HALDOL) injection 5 mg  5 mg Intramuscular TID PRN Smith, Annie B, NP        And   diphenhydrAMINE  (BENADRYL ) injection 50 mg  50 mg Intramuscular TID PRN Smith, Annie B, NP       And   LORazepam (ATIVAN) injection 2 mg  2 mg Intramuscular TID PRN Smith, Annie B, NP       haloperidol lactate (HALDOL) injection 10 mg  10 mg Intramuscular TID PRN Smith, Annie B, NP       And   diphenhydrAMINE  (BENADRYL ) injection 50 mg  50 mg Intramuscular TID PRN Smith, Annie B, NP       And   LORazepam (ATIVAN) injection 2 mg  2 mg Intramuscular TID PRN Smith, Annie B, NP       DULoxetine  (CYMBALTA ) DR capsule 90 mg  90 mg Oral Daily Jadapalle, Sree, MD   90 mg at 11/11/23 0744   folic acid (FOLVITE) tablet 1 mg  1 mg Oral Daily Smith, Annie B, NP   1 mg at 11/11/23 0744   insulin  aspart (novoLOG ) injection 0-5 Units  0-5 Units Subcutaneous QHS Smith, Annie B, NP   4 Units at 11/10/23 2036   insulin  aspart (novoLOG ) injection 0-9 Units  0-9 Units Subcutaneous TID WC Smith, Annie B, NP   5 Units at 11/11/23 0751   insulin  aspart (novoLOG ) injection 8 Units  8 Units Subcutaneous TID WC Jadapalle, Sree, MD   8 Units at 11/11/23 9356   insulin  glargine (LANTUS) injection 20 Units  20 Units Subcutaneous Daily Jadapalle, Sree, MD   20 Units at 11/11/23 0748   levothyroxine  (SYNTHROID ) tablet 250 mcg  250 mcg Oral Daily Smith, Annie B, NP   250 mcg at 11/11/23 0636   magnesium hydroxide (MILK OF MAGNESIA) suspension 30 mL  30 mL Oral Daily PRN Smith, Annie B, NP       multivitamin with minerals tablet 1 tablet  1 tablet Oral Daily Smith, Annie B, NP   1 tablet at 11/11/23 0744   oxyCODONE  (Oxy IR/ROXICODONE ) immediate  release tablet 5 mg  5 mg Oral Q8H PRN Smith, Annie B, NP   5 mg at 11/11/23 9362   pantoprazole  (PROTONIX ) EC tablet 40 mg  40 mg Oral Daily Smith, Annie B, NP   40 mg at 11/11/23 0744   thiamine (VITAMIN B1) tablet 100 mg  100 mg Oral Daily Smith, Annie B, NP   100 mg at 11/11/23 9255   Or   thiamine (VITAMIN B1) injection 100 mg  100 mg Intravenous Daily Smith, Annie B,  NP        Lab Results:  Results for orders placed or performed during the hospital encounter of 11/06/23 (from the past 48 hours)  Glucose, capillary     Status: Abnormal   Collection Time: 11/09/23  4:03 PM  Result Value Ref Range   Glucose-Capillary 349 (H) 70 - 99 mg/dL    Comment: Glucose reference range applies only to samples taken after fasting for at least 8 hours.  Glucose, capillary     Status: Abnormal   Collection Time: 11/09/23  9:47 PM  Result Value Ref Range   Glucose-Capillary 187 (H) 70 - 99 mg/dL    Comment: Glucose reference range applies only to samples taken after fasting for at least 8 hours.  Glucose, capillary     Status: Abnormal   Collection Time: 11/10/23  7:10 AM  Result Value Ref Range   Glucose-Capillary 221 (H) 70 - 99 mg/dL    Comment: Glucose reference range applies only to samples taken after fasting for at least 8 hours.  Glucose, capillary     Status: None   Collection Time: 11/10/23  4:34 PM  Result Value Ref Range   Glucose-Capillary 96 70 - 99 mg/dL    Comment: Glucose reference range applies only to samples taken after fasting for at least 8 hours.  Glucose, capillary     Status: Abnormal   Collection Time: 11/10/23  8:18 PM  Result Value Ref Range   Glucose-Capillary 326 (H) 70 - 99 mg/dL    Comment: Glucose reference range applies only to samples taken after fasting for at least 8 hours.  Glucose, capillary     Status: Abnormal   Collection Time: 11/11/23  6:34 AM  Result Value Ref Range   Glucose-Capillary 352 (H) 70 - 99 mg/dL    Comment: Glucose reference range applies only to samples taken after fasting for at least 8 hours.  Glucose, capillary     Status: Abnormal   Collection Time: 11/11/23  7:40 AM  Result Value Ref Range   Glucose-Capillary 289 (H) 70 - 99 mg/dL    Comment: Glucose reference range applies only to samples taken after fasting for at least 8 hours.  Glucose, capillary     Status: Abnormal   Collection Time:  11/11/23 11:22 AM  Result Value Ref Range   Glucose-Capillary 171 (H) 70 - 99 mg/dL    Comment: Glucose reference range applies only to samples taken after fasting for at least 8 hours.    Blood Alcohol level:  Lab Results  Component Value Date   ETH 112 (H) 10/31/2023   ETH <10 11/25/2022    Metabolic Disorder Labs: Lab Results  Component Value Date   HGBA1C 9.8 (H) 10/31/2023   MPG 234.56 10/31/2023   MPG 148 08/17/2020   No results found for: PROLACTIN Lab Results  Component Value Date   CHOL 226 (H) 09/11/2018   TRIG 89 09/11/2018   HDL 65 09/11/2018   CHOLHDL  3.5 09/11/2018   LDLCALC 143 (H) 09/11/2018   LDLCALC 214 (H) 03/04/2018    Physical Findings: AIMS:  , ,  ,  ,    CIWA:    COWS:      Psychiatric Specialty Exam:  Presentation  General Appearance:  Appropriate for Environment  Eye Contact: Minimal  Speech: Normal Rate  Speech Volume: Normal    Mood and Affect  Mood: Euthymic  Affect: Appropriate   Thought Process  Thought Processes: Linear; Coherent  Descriptions of Associations:Intact  Orientation:Full (Time, Place and Person)  Thought Content: Coherent Hallucinations: Denies  Ideas of Reference:None  Suicidal Thoughts: Denies  Homicidal Thoughts: Denies   Sensorium  Memory: Immediate Fair; Recent Fair; Remote Fair  Judgment: Improving Insight: Improving  Executive Functions  Concentration: Good  Attention Span: Good  Recall: Fair  Fund of Knowledge: Good  Language: Good   Psychomotor Activity  Psychomotor Activity: Psychomotor Activity: Normal   Musculoskeletal: Strength & Muscle Tone: within normal limits Gait & Station: normal Assets  Assets: Desire for Improvement    Physical Exam: Physical Exam Vitals and nursing note reviewed.    Review of Systems  Constitutional: Negative.   HENT: Negative.    Eyes: Negative.   Respiratory: Negative.    Cardiovascular: Negative.    Gastrointestinal: Negative.   Genitourinary: Negative.   Musculoskeletal:  Positive for back pain.  Skin: Negative.   Neurological: Negative.   Endo/Heme/Allergies: Negative.   Psychiatric/Behavioral: Negative.     Blood pressure 108/71, pulse (!) 58, temperature 98.5 F (36.9 C), temperature source Oral, resp. rate 20, height 5' 10 (1.778 m), weight 79.8 kg, SpO2 96%. Body mass index is 25.25 kg/m.  Diagnosis: Active Problems:   Adjustment disorder with mixed anxiety and depressed mood  Clinical Decision Making: Patient is currently admitted after insulin  overdose and critical hypoglycemia and he did acknowledge it as suicide attempt in ED.Since admission patient remains superficial and minimally engaged in treatment plan.  Treatment Plan Summary:  Safety and Monitoring:             -- Voluntary admission to inpatient psychiatric unit for safety, stabilization and treatment             -- Daily contact with patient to assess and evaluate symptoms and progress in treatment             -- Patient's case to be discussed in multi-disciplinary team meeting             -- Observation Level: q15 minute checks             -- Vital signs:  q12 hours             -- Precautions: suicide, elopement, and assault   2. Psychiatric Diagnoses and Treatment:                continue cymbalta  90 mg   -- The risks/benefits/side-effects/alternatives to this medication were discussed in detail with the patient and time was given for questions. The patient consents to medication trial.                -- Metabolic profile and EKG monitoring obtained while on an atypical antipsychotic (BMI: Lipid Panel: HbgA1c: QTc:)              -- Encouraged patient to participate in unit milieu and in scheduled group therapies               4. Discharge Planning:   --  Social work and case management to assist with discharge planning and identification of hospital follow-up needs prior to discharge  -- Estimated  LOS: 3-4 days  Daine KATHEE Ober, NP 11/11/2023, 1:43 PM

## 2023-11-11 NOTE — Progress Notes (Signed)
   11/10/23 2037  Psych Admission Type (Psych Patients Only)  Admission Status Involuntary  Psychosocial Assessment  Patient Complaints None  Eye Contact Fair  Facial Expression Flat  Affect Appropriate to circumstance  Speech Logical/coherent  Interaction Assertive  Motor Activity Restless  Appearance/Hygiene In scrubs  Behavior Characteristics Cooperative  Mood Labile  Thought Process  Coherency WDL  Content WDL  Delusions None reported or observed  Perception WDL  Hallucination None reported or observed  Judgment Poor  Confusion None  Danger to Self  Current suicidal ideation? Denies  Danger to Others  Danger to Others None reported or observed

## 2023-11-11 NOTE — Progress Notes (Signed)
   11/11/23 1000  Psych Admission Type (Psych Patients Only)  Admission Status Involuntary  Psychosocial Assessment  Patient Complaints None  Eye Contact Fair  Facial Expression Animated  Affect Appropriate to circumstance  Speech Logical/coherent  Interaction Assertive  Motor Activity Restless  Appearance/Hygiene In scrubs  Behavior Characteristics Cooperative  Mood Anxious  Thought Process  Coherency WDL  Content WDL  Delusions None reported or observed  Perception WDL  Hallucination None reported or observed  Judgment Poor  Confusion None  Danger to Self  Current suicidal ideation? Denies  Danger to Others  Danger to Others None reported or observed

## 2023-11-11 NOTE — Plan of Care (Signed)

## 2023-11-12 DIAGNOSIS — T1491XA Suicide attempt, initial encounter: Secondary | ICD-10-CM | POA: Diagnosis not present

## 2023-11-12 DIAGNOSIS — F4323 Adjustment disorder with mixed anxiety and depressed mood: Secondary | ICD-10-CM | POA: Diagnosis not present

## 2023-11-12 LAB — GLUCOSE, CAPILLARY
Glucose-Capillary: 326 mg/dL — ABNORMAL HIGH (ref 70–99)
Glucose-Capillary: 107 mg/dL — ABNORMAL HIGH (ref 70–99)

## 2023-11-12 MED ORDER — DULOXETINE HCL 30 MG PO CPEP: 90.0000 mg | ORAL_CAPSULE | Freq: Every day | ORAL | 0 refills | Status: AC

## 2023-11-12 MED ORDER — ADULT MULTIVITAMIN W/MINERALS CH
1.0000 | ORAL_TABLET | Freq: Every day | ORAL | 0 refills | Status: AC
Start: 2023-11-13 — End: ?

## 2023-11-12 MED ORDER — INSULIN GLARGINE 100 UNIT/ML ~~LOC~~ SOLN: 20.0000 [IU] | Freq: Every day | SUBCUTANEOUS | 0 refills | Status: AC

## 2023-11-12 MED ORDER — VITAMIN B-1 100 MG PO TABS: 100.0000 mg | ORAL_TABLET | Freq: Every day | ORAL | 0 refills | Status: AC

## 2023-11-12 MED ORDER — INSULIN ASPART 100 UNIT/ML IJ SOLN: 8.0000 [IU] | Freq: Three times a day (TID) | INTRAMUSCULAR | 0 refills | Status: AC

## 2023-11-12 MED ORDER — FOLIC ACID 1 MG PO TABS: 1.0000 mg | ORAL_TABLET | Freq: Every day | ORAL | 0 refills | Status: AC

## 2023-11-12 MED ORDER — ADULT MULTIVITAMIN W/MINERALS CH: 1.0000 | ORAL_TABLET | Freq: Every day | ORAL | 0 refills | Status: DC

## 2023-11-12 NOTE — Group Note (Signed)
 Date:  11/12/2023 Time:  7:38 AM  Group Topic/Focus:  Personal Choices and Values:   The focus of this group is to help patients assess and explore the importance of values in their lives, how their values affect their decisions, how they express their values and what opposes their expression. Self Care:   The focus of this group is to help patients understand the importance of self-care in order to improve or restore emotional, physical, spiritual, interpersonal, and financial health. Self Esteem Action Plan:   The focus of this group is to help patients create a plan to continue to build self-esteem after discharge. Wrap-Up Group:   The focus of this group is to help patients review their daily goal of treatment and discuss progress on daily workbooks.    Participation Level:  Active  Participation Quality:  Appropriate  Affect:  Appropriate  Cognitive:  Appropriate and Oriented  Insight: Appropriate  Engagement in Group:  Engaged  Modes of Intervention:  Discussion and Support  Additional Comments:  N/A  Ronald Banks 11/12/2023, 7:38 AM

## 2023-11-12 NOTE — [facility] Suicide Risk Assessment (Signed)
 [facility] Discharge Suicide Risk Assessment   Principal Problem: Suicide attempt [facility]) Discharge Diagnoses: Active Problems:   Adjustment disorder with mixed anxiety and depressed mood   Total Time spent with patient: 30 minutes  Musculoskeletal: Strength & Muscle Tone: within normal limits Gait & Station: normal Patient leans: N/A  Psychiatric Specialty Exam  Presentation  General Appearance:  Appropriate for Environment; Casual  Eye Contact: Fair  Speech: Clear and Coherent  Speech Volume: Normal  Handedness: Right   Mood and Affect  Mood: Euthymic  Duration of Depression Symptoms: No data recorded Affect: Appropriate   Thought Process  Thought Processes: Coherent  Descriptions of Associations:Intact  Orientation:Full (Time, Place and Person)  Thought Content:Logical  History of Schizophrenia/Schizoaffective disorder:No data recorded Duration of Psychotic Symptoms:No data recorded Hallucinations:Hallucinations: None  Ideas of Reference:None  Suicidal Thoughts:Suicidal Thoughts: No  Homicidal Thoughts:Homicidal Thoughts: No   Sensorium  Memory: Immediate Fair; Recent Fair  Judgment: Fair  Insight: Fair   Art therapist  Concentration: Fair  Attention Span: Fair  Recall: [employer] of Knowledge: Fair  Language: Fair   Psychomotor Activity  Psychomotor Activity: Psychomotor Activity: Normal   Assets  Assets: Communication Skills; Desire for Improvement; Resilience   Sleep  Sleep: Sleep: Fair  Estimated Sleeping Duration (Last 24 Hours): 5.75-6.75 hours  Physical Exam: Physical Exam ROS Blood pressure 121/65, pulse (!) 58, temperature 98.5 F (36.9 C), temperature source Oral, resp. rate 20, height 5' 10 (1.778 m), weight 79.8 kg, SpO2 96%. Body mass index is 25.25 kg/m.  Mental Status Per Nursing Assessment::   On Admission:  Self-harm behaviors  Demographic Factors:  Living alone  Loss  Factors: Decrease in vocational status  Historical Factors: Impulsivity  Risk Reduction Factors:   Positive social support, Positive therapeutic relationship, and Positive coping skills or problem solving skills  Continued Clinical Symptoms:  Depression:   Comorbid alcohol abuse/dependence  Cognitive Features That Contribute To Risk:  None    Suicide Risk:  Minimal: No identifiable suicidal ideation.  Patients presenting with no risk factors but with morbid ruminations; may be classified as minimal risk based on the severity of the depressive symptoms   Follow-up Information     [facility] Follow up.   Why: Appointment is scheduled for 11/19/23 at 8am and it will be a virtual appointment. Contact information: [address]  [address] [place] [address] [phone]                 Plan Of Care/Follow-up recommendations:  Activity:  As tolerated  Allyn Foil, MD 11/12/2023, 12:05 PM

## 2023-11-12 NOTE — Group Note (Signed)
 [facility] LCSW Group Therapy Note   Group Date: 11/12/2023 Start Time: 1300 End Time: 1400   Type of Therapy/Topic:  Group Therapy:  Emotion Regulation  Participation Level:  Did Not Attend   Mood:  Description of Group:    The purpose of this group is to assist patients in learning to regulate negative emotions and experience positive emotions. Patients will be guided to discuss ways in which they have been vulnerable to their negative emotions. These vulnerabilities will be juxtaposed with experiences of positive emotions or situations, and patients challenged to use positive emotions to combat negative ones. Special emphasis will be placed on coping with negative emotions in conflict situations, and patients will process healthy conflict resolution skills.  Therapeutic Goals: Patient will identify two positive emotions or experiences to reflect on in order to balance out negative emotions:  Patient will label two or more emotions that they find the most difficult to experience:  Patient will be able to demonstrate positive conflict resolution skills through discussion or role plays:   Summary of Patient Progress:   Patient did not attend.     Therapeutic Modalities:   Cognitive Behavioral Therapy Feelings Identification Dialectical Behavioral Therapy   Ronald CHRISTELLA Kerns, LCSW

## 2023-11-12 NOTE — Progress Notes (Signed)
  [facility] Adult Case Management Discharge Plan :  Will you be returning to the same living situation after discharge:  Yes,  Patient to return home.  At discharge, do you have transportation home?: Yes,  CSW has arranged transportation on patient's behalf.  Do you have the ability to pay for your medications: Yes,  [place] MEDICAID PREPAID HEALTH PLAN /  MEDICAID [facility]  Release of information consent forms completed and in the chart;  Patient's signature needed at discharge.  Patient to Follow up at:  Follow-up Information     [facility] Follow up.   Why: Appointment is scheduled for 11/19/23 at 8am and it will be a virtual appointment. Contact information: [address]  [address] [place] [address] [phone]                 Next level of care provider has access to [facility] Link:no  Safety Planning and Suicide Prevention discussed: Yes,  SPE completed with patient after several attempts with Darla Willimson, mother, [phone].      Has patient been referred to the Quitline?: Patient refused referral for treatment  Patient has been referred for addiction treatment: Yes, the patient will follow up with an outpatient provider for substance use disorder. Psychiatrist/APP: appointment made and Therapist: appointment made  Alveta CHRISTELLA Kerns, LCSW 11/12/2023, 9:36 AM

## 2023-11-12 NOTE — Discharge Summary (Signed)
 Physician Discharge Summary Note  Patient:  Ronald Banks is an 48 y.o., male MRN:  [id number] DOB:  27-Dec-1975 Patient phone:  [phone] (home)  Patient address:   [address] [place] [address],   Total time spent: 40 min Date of Admission:  11/06/2023 Date of Discharge: 11/12/23  Reason for Admission:  Ronald Banks is a pleasant 48 y.o. male with medical history significant for type 1 diabetes on insulin  pump, HTN, HLD, hypothyroidism, depression, tobacco dependence, polysubstance abuse who presented to ED today via private vehicle for acute change in mental status/unresponsiveness in the vehicle.  Patient was driven by a friend who was with the patient throughout the night.  Patient was  in his normal state until last night.  Patient and his friend were drinking out and did not feel well and wanted to be taken home.  While the friend was driving him home he became unresponsive and the friend drove him to the emergency room at [facility]. He did endorse intentionally taking insulin  prior to his arrival to the emergency department in an attempt to kill self. He reported he did not tell his friend what he did and that his friend drove him to the emergency department because he became unresponsive. He endorsed homicidal ideations but would not elaborate on who or if he had a plan. He did report the homicidal ideations were due to a certain situation Patient is admitted to adult psych unit with Q15 min safety monitoring. Multidisciplinary team approach is offered. Medication management; group/milieu therapy is offered.   Principal Problem: Suicide attempt [facility]) Discharge Diagnoses: Active Problems:   Adjustment disorder with mixed anxiety and depressed mood   Past Psychiatric History: see h&p  Family Psychiatric  History: see h&p Social History:  Social History   Substance and Sexual Activity  Alcohol Use Yes     Social History   Substance and Sexual Activity  Drug Use  Yes   Types: Marijuana   Comment: Several times per week    Social History   Socioeconomic History   Marital status: Single    Spouse name: Not on file   Number of children: Not on file   Years of education: 8   Highest education level: 8th grade  Occupational History   Not on file  Tobacco Use   Smoking status: Every Day    Average packs/day: 1.5 packs/day for 31.0 years (46.0 ttl pk-yrs)    Types: Cigarettes    Start date: 2024   Smokeless tobacco: Never  Vaping Use   Vaping status: Never Used  Substance and Sexual Activity   Alcohol use: Yes   Drug use: Yes    Types: Marijuana    Comment: Several times per week   Sexual activity: Yes  Other Topics Concern   Not on file  Social History Narrative   Right handed    Caffeine 2 cups per day    Lives at home with his mom    Social Drivers of Health   Financial Resource Strain: Low Risk  (05/02/2023)   Overall Financial Resource Strain (CARDIA)    Difficulty of Paying Living Expenses: Not very hard  Food Insecurity: No Food Insecurity (11/06/2023)   Hunger Vital Sign    Worried About Running Out of Food in the Last Year: Never true    Ran Out of Food in the Last Year: Never true  Transportation Needs: No Transportation Needs (11/06/2023)   PRAPARE - Transportation    Lack of  Transportation (Medical): No    Lack of Transportation (Non-Medical): No  Physical Activity: Inactive (05/02/2023)   Exercise Vital Sign    Days of Exercise per Week: 0 days    Minutes of Exercise per Session: 0 min  Stress: Not on file  Social Connections: Socially Isolated (10/31/2023)   Social Connection and Isolation Panel    Frequency of Communication with Friends and Family: Three times a week    Frequency of Social Gatherings with Friends and Family: Twice a week    Attends Religious Services: Never    Database administrator or Organizations: No    Attends Engineer, structural: Never    Marital Status: Never married   Past  Medical History:  Past Medical History:  Diagnosis Date   ADHD (attention deficit hyperactivity disorder), inattentive type    Chronic midline low back pain with right-sided sciatica 02/11/2018   Chronic right shoulder pain 02/11/2018   DKA (diabetic ketoacidosis) 08/17/2020   DOE (dyspnea on exertion) 05/19/2019   Onset 2015/16 05/19/2019   Walked RA x two laps =  approx 569ft @ brisk pace - stopped due to end of study with sats of 98 % at the end of the study and c/o sob s cp  > rec CPST next  - alpha one AT  Screen   05/19/19 MM  152  - CPST  06/30/19  FEV1 3.79 (93%) with ratio 0.73 and nl f/v loop: ex study  probabably  wnl when wt factored  In  - sob with ventilatory reserve suggested sub max effort and de   Hyperlipidemia due to type 1 diabetes mellitus 04/18/2018   Hypertension associated with diabetes 04/18/2018   Trial off acei 04/07/2019 due to cough > improved by 07/03/2019 with just a typical smoker's rattle (no longer cough to point of gag/vomit)   Hypothyroidism    Major depressive disorder 10/09/2019   Moderate nonproliferative diabetic retinopathy of both eyes (HCC) 05/19/2019   Tobacco dependence 10/09/2019   Type 1 diabetes mellitus with diabetic polyneuropathy 04/18/2018    Past Surgical History:  Procedure Laterality Date   DENTAL SURGERY     EAR CYST EXCISION Right 03/24/2020   Procedure: EXCISION PRE-AURICULAR CYST;  Surgeon: Milissa Hamming, MD;  Location: [facility];  Service: ENT;  Laterality: Right;  diabetic insulin  pump   PREAURICULAR CYST EXCISION Left 07/07/2020   Procedure: EXCISION PREAURICULAR CYST ADULT;  Surgeon: Milissa Hamming, MD;  Location: [facility];  Service: ENT;  Laterality: Left;   Family History:  Family History  Problem Relation Age of Onset   Diabetes Mother    Cancer Mother        Brain, breast    Diabetes Sister    Diabetes Maternal Grandmother     Hospital Course:  Ronald Banks is a pleasant 48 y.o. male with  medical history significant for type 1 diabetes on insulin  pump, HTN, HLD, hypothyroidism, depression, tobacco dependence, polysubstance abuse who presented to ED today via private vehicle for acute change in mental status/unresponsiveness in the vehicle.  Patient was driven by a friend who was with the patient throughout the night.  Patient was  in his normal state until last night.  Patient and his friend were drinking out and did not feel well and wanted to be taken home.  While the friend was driving him home he became unresponsive and the friend drove him to the emergency room at [facility]. He did endorse intentionally taking insulin  prior  to his arrival to the emergency department in an attempt to kill self. He reported he did not tell his friend what he did and that his friend drove him to the emergency department because he became unresponsive. He endorsed homicidal ideations but would not elaborate on who or if he had a plan. He did report the homicidal ideations were due to a certain situation Patient is admitted to adult psych unit with Q15 min safety monitoring. Multidisciplinary team approach is offered. Medication management; group/milieu therapy is offered.  Detailed risk assessment is complete based on clinical exam and individual risk factors and acute suicide risk is low and acute violence risk is low.   On admission patient was initially very superficially engaged minimizing his symptoms.  He eventually he participated in his treatment plan.  We adjusted his Cymbalta  dosage to 90 mg to optimize antidepressant effect.  Diabetic specialist consult was placed and they continue to manage his insulin  coverage for his poorly controlled diabetes.  Patient maintained safe behaviors on the unit.  On the day of discharge patient consistently denies SI/HI/plan.  Patient denies auditory/visual hallucinations.  She remains future oriented and is willing to participate in outpatient mental health  services Currently, all modifiable risk of harm to self/harm to others have been addressed and patient is no longer appropriate for the acute inpatient setting and is able to continue treatment for mental health needs in the community with the supports as indicated below.  Patient is educated and verbalized understanding of discharge plan of care including medications, follow-up appointments, mental health resources and further crisis services in the community.  He is instructed to call 911 or present to the nearest emergency room should he experience any decompensation in mood, disturbance of bowel or return of suicidal/homicidal ideations.  Patient verbalizes understanding of this education and agrees to this plan of care  Physical Findings: AIMS:  , ,  ,  ,    CIWA:    COWS:        Psychiatric Specialty Exam:  Presentation  General Appearance:  Appropriate for Environment; Casual  Eye Contact: Fair  Speech: Clear and Coherent  Speech Volume: Normal    Mood and Affect  Mood: Euthymic  Affect: Appropriate   Thought Process  Thought Processes: Coherent  Descriptions of Associations:Intact  Orientation:Full (Time, Place and Person)  Thought Content:Logical  Hallucinations:Hallucinations: None  Ideas of Reference:None  Suicidal Thoughts:Suicidal Thoughts: No  Homicidal Thoughts:Homicidal Thoughts: No   Sensorium  Memory: Immediate Fair; Recent Fair  Judgment: Fair  Insight: Fair   Art therapist  Concentration: Fair  Attention Span: Fair  Recall: [employer] of Knowledge: Fair  Language: Fair   Psychomotor Activity  Psychomotor Activity: Psychomotor Activity: Normal  Musculoskeletal: Strength & Muscle Tone: within normal limits Gait & Station: normal Assets  Assets: Manufacturing systems engineer; Desire for Improvement; Resilience   Sleep  Sleep: Sleep: Fair    Physical Exam: Physical Exam Vitals and nursing note  reviewed.    ROS Blood pressure 121/65, pulse (!) 58, temperature 98.5 F (36.9 C), temperature source Oral, resp. rate 20, height 5' 10 (1.778 m), weight 79.8 kg, SpO2 96%. Body mass index is 25.25 kg/m.   Social History   Tobacco Use  Smoking Status Every Day   Average packs/day: 1.5 packs/day for 31.0 years (46.0 ttl pk-yrs)   Types: Cigarettes   Start date: 2024  Smokeless Tobacco Never   Tobacco Cessation:  A prescription for an FDA-approved tobacco cessation medication was  offered at discharge and the patient refused   Blood Alcohol level:  Lab Results  Component Value Date   ETH 112 (H) 10/31/2023   ETH <10 11/25/2022    Metabolic Disorder Labs:  Lab Results  Component Value Date   HGBA1C 9.8 (H) 10/31/2023   MPG 234.56 10/31/2023   MPG 148 08/17/2020   No results found for: PROLACTIN Lab Results  Component Value Date   CHOL 226 (H) 09/11/2018   TRIG 89 09/11/2018   HDL 65 09/11/2018   CHOLHDL 3.5 09/11/2018   LDLCALC 143 (H) 09/11/2018   LDLCALC 214 (H) 03/04/2018    See Psychiatric Specialty Exam and Suicide Risk Assessment completed by Attending Physician prior to discharge.  Discharge destination:  Home  Is patient on multiple antipsychotic therapies at discharge:  No   Has Patient had three or more failed trials of antipsychotic monotherapy by history:  No  Recommended Plan for Multiple Antipsychotic Therapies: NA   Allergies as of 11/12/2023   No Known Allergies      Medication List     STOP taking these medications    acetaminophen  325 MG tablet Commonly known as: TYLENOL        TAKE these medications      Indication  atorvastatin  10 MG tablet Commonly known as: LIPITOR Take 10 mg by mouth daily.  Indication: High Amount of Fats in the Blood   DULoxetine  30 MG capsule Commonly known as: CYMBALTA  Take 3 capsules (90 mg total) by mouth daily. Start taking on: November 13, 2023 What changed:  medication strength how  much to take  Indication: Major Depressive Disorder   folic acid 1 MG tablet Commonly known as: FOLVITE Take 1 tablet (1 mg total) by mouth daily. Start taking on: November 13, 2023  Indication: Anemia From Inadequate Folic Acid   insulin  aspart 100 UNIT/ML injection Commonly known as: novoLOG  Inject 0-5 Units into the skin at bedtime. What changed: Another medication with the same name was added. Make sure you understand how and when to take each.  Indication: High Blood Sugar   insulin  aspart 100 UNIT/ML injection Commonly known as: novoLOG  Inject 0-9 Units into the skin 3 (three) times daily with meals. What changed: Another medication with the same name was added. Make sure you understand how and when to take each.  Indication: High Blood Sugar   insulin  aspart 100 UNIT/ML injection Commonly known as: novoLOG  Inject 8 Units into the skin 3 (three) times daily with meals. What changed: You were already taking a medication with the same name, and this prescription was added. Make sure you understand how and when to take each.  Indication: High Blood Sugar   insulin  glargine 100 UNIT/ML injection Commonly known as: LANTUS Inject 0.2 mLs (20 Units total) into the skin daily. Start taking on: November 13, 2023 What changed: how much to take  Indication: High Blood Sugar   levothyroxine  125 MCG tablet Commonly known as: SYNTHROID  Take 2 tablets by mouth daily.  Indication: Underactive Thyroid    multivitamin with minerals Tabs tablet Take 1 tablet by mouth daily. Start taking on: November 13, 2023  Indication: 21-Hydroxylase Deficiency   thiamine 100 MG tablet Commonly known as: Vitamin B-1 Take 1 tablet (100 mg total) by mouth daily. Start taking on: November 13, 2023  Indication: Deficiency of Vitamin B1        Follow-up Information     [facility] Follow up.   Why: Appointment is scheduled for 11/19/23 at 8am and  it will be a virtual appointment. Contact  information: [address]  [address] [place] [address] [phone]                 Follow-up recommendations:  Activity:  As tolerated    Signed: Stephano Arrants, MD 11/12/2023, 12:05 PM

## 2023-11-12 NOTE — Progress Notes (Signed)
 Patient ID: Ronald Banks, male   DOB: 08-16-1975, 48 y.o.   MRN: [id number] Patient discharged to home/self-care on his own accord. Patient denies SI, HI, and AVH this shift. Continue to monitor patient as planned. Patient able to contract for safety.

## 2023-11-12 NOTE — Plan of Care (Signed)
  Problem: Education: Goal: Knowledge of [place] Education information/materials will improve Outcome: Adequate for Discharge Goal: Emotional status will improve Outcome: Adequate for Discharge Goal: Mental status will improve Outcome: Adequate for Discharge Goal: Verbalization of understanding the information provided will improve Outcome: Adequate for Discharge   Problem: Activity: Goal: Interest or engagement in activities will improve Outcome: Adequate for Discharge Goal: Sleeping patterns will improve Outcome: Adequate for Discharge   Problem: Coping: Goal: Ability to verbalize frustrations and anger appropriately will improve Outcome: Adequate for Discharge Goal: Ability to demonstrate self-control will improve Outcome: Adequate for Discharge   Problem: Health Behavior/Discharge Planning: Goal: Identification of resources available to assist in meeting health care needs will improve Outcome: Adequate for Discharge Goal: Compliance with treatment plan for underlying cause of condition will improve Outcome: Adequate for Discharge   Problem: Physical Regulation: Goal: Ability to maintain clinical measurements within normal limits will improve Outcome: Adequate for Discharge   Problem: Safety: Goal: Periods of time without injury will increase Outcome: Adequate for Discharge  Patient discharged to home/self-care

## 2023-11-12 NOTE — Plan of Care (Signed)
   Problem: Education: Goal: Knowledge of [place] [facility] Education information/materials will improve Outcome: Progressing Goal: Emotional status will improve Outcome: Progressing Goal: Mental status will improve Outcome: Progressing Goal: Verbalization of understanding the information provided will improve Outcome: Progressing

## 2023-11-12 NOTE — Group Note (Signed)
 Recreation Therapy Group Note   Group Topic:General Recreation  Group Date: 11/12/2023 Start Time: 1055 End Time: 1135 Facilitators: Celestia Jeoffrey BRAVO, LRT, CTRS Location: Courtyard  Group Description: [employer]. LRT and patients played games of basketball, drew with chalk, and played corn hole while outside in the courtyard while getting fresh air and sunlight. Music was being played in the background. LRT and peers conversed about different games they have played before, what they do in their free time and anything else that is on their minds. LRT encouraged pts to drink water after being outside, sweating and getting their heart rate up.  Goal Area(s) Addressed: Patient will build on frustration tolerance skills. Patients will partake in a competitive play game with peers. Patients will gain knowledge of new leisure interest/hobby.    Affect/Mood: N/A   Participation Level: Did not attend    Clinical Observations/Individualized Feedback: Patient did not attend group.   Plan: Continue to engage patient in RT group sessions 2-3x/week.   Jeoffrey BRAVO Celestia, LRT, CTRS 11/12/2023 12:52 PM

## 2023-11-12 NOTE — Group Note (Signed)
 Date:  11/12/2023 Time:  10:04 AM  Group Topic/Focus:  Goals Group:   The focus of this group is to help patients establish daily goals to achieve during treatment and discuss how the patient can incorporate goal setting into their daily lives to aide in recovery.    Participation Level:  Active  Participation Quality:  Appropriate  Affect:  Appropriate  Cognitive:  Appropriate  Insight: Appropriate  Engagement in Group:  Engaged  Modes of Intervention:  Activity  Additional Comments:    Ronald Banks 11/12/2023, 10:04 AM

## 2023-11-13 ENCOUNTER — Telehealth: Payer: Self-pay | Admitting: *Deleted

## 2023-11-13 ENCOUNTER — Telehealth: Payer: Self-pay | Admitting: Internal Medicine

## 2023-11-13 ENCOUNTER — Other Ambulatory Visit: Payer: Self-pay | Admitting: Licensed Clinical Social Worker

## 2023-11-13 DIAGNOSIS — Z59819 Housing instability, housed unspecified: Secondary | ICD-10-CM

## 2023-11-13 NOTE — Telephone Encounter (Signed)
 Copied from CRM [id number]. Topic: Appointments - Transfer of Care >> Nov 13, 2023 10:42 AM Farrel B wrote:  Pt is requesting to transfer FROM: [facility] Dr. Vicci Pt is requesting to transfer TO: [facility] Dr. Isaiah Pepper  Reason for requested transfer: 11/13/2023 It is the responsibility of the team the patient would like to transfer to (Dr. Isaiah Pepper at [facility] to reach out to the patient if for any reason this transfer is not acceptable.  Patient Ronald Banks [phone], called in with hospital case manager to schedule a hospital f/u as well as do a transfer of care

## 2023-11-13 NOTE — Transitions of Care (Post Inpatient/ED Visit) (Signed)
 11/13/2023  Name: Ronald Banks MRN: [id number] DOB: 1975/11/02  Today's TOC FU Call Status: Today's TOC FU Call Status:: Successful TOC FU Call Completed TOC FU Call Complete Date: 11/13/23 Patient's Name and Date of Birth confirmed.  Transition Care Management Follow-up Telephone Call Date of Discharge: 11/12/23 Discharge Facility: [facility]) Type of Discharge: Inpatient Admission Primary Inpatient Discharge Diagnosis:: Suicide attempt How have you been since you were released from the hospital?: Same (I am just here) Any questions or concerns?: Yes Patient Questions/Concerns:: Patient just found out that he can no longer live with his mother. Patient Questions/Concerns Addressed: Other: (Referral to BSW for housing resources)  Items Reviewed: Did you receive and understand the discharge instructions provided?: Yes Medications obtained,verified, and reconciled?: Yes (Medications Reviewed) Any new allergies since your discharge?: No Dietary orders reviewed?: Yes Type of Diet Ordered:: carb modified Do you have support at home?: Yes People in Home [RPT]: alone, friend(s) Name of Support/Comfort Primary Source: Friend/[name]  Medications Reviewed Today: Medications Reviewed Today     Reviewed by Lucky Andrea LABOR, RN (Registered Nurse) on 11/13/23 at 1111  Med List Status: <None>   Medication Order Taking? Sig Documenting Provider Last Dose Status Informant  atorvastatin  (LIPITOR) 10 MG tablet [id number]  Take 10 mg by mouth daily.  Patient not taking: Reported on 11/13/2023   [provider]  Active Other  DULoxetine  (CYMBALTA ) 30 MG capsule [id number]  Take 3 capsules (90 mg total) by mouth daily.  Patient not taking: Reported on 11/13/2023   Jadapalle, Sree, MD  Active   folic acid (FOLVITE) 1 MG tablet [id number]  Take 1 tablet (1 mg total) by mouth daily.  Patient not taking: Reported on 11/13/2023   Jadapalle, Sree, MD  Active    insulin  aspart (NOVOLOG ) 100 UNIT/ML injection [id number] Yes Inject 0-5 Units into the skin at bedtime. Jens Durand, MD  Active   insulin  aspart (NOVOLOG ) 100 UNIT/ML injection [id number] Yes Inject 0-9 Units into the skin 3 (three) times daily with meals. Jens Durand, MD  Active   insulin  aspart (NOVOLOG ) 100 UNIT/ML injection [id number] Yes Inject 8 Units into the skin 3 (three) times daily with meals. Jadapalle, Sree, MD  Active   insulin  glargine (LANTUS) 100 UNIT/ML injection [id number]  Inject 0.2 mLs (20 Units total) into the skin daily.  Patient not taking: Reported on 11/13/2023   Jadapalle, Sree, MD  Active   levothyroxine  (SYNTHROID ) 125 MCG tablet [id number]  Take 2 tablets by mouth daily.  Patient not taking: Reported on 11/13/2023   [provider]  Active Other  Multiple Vitamin (MULTIVITAMIN WITH MINERALS) TABS tablet [id number]  Take 1 tablet by mouth daily.  Patient not taking: Reported on 11/13/2023   Jadapalle, Sree, MD  Active   thiamine (VITAMIN B-1) 100 MG tablet [id number]  Take 1 tablet (100 mg total) by mouth daily.  Patient not taking: Reported on 11/13/2023   Jadapalle, Sree, MD  Active             Home Care and Equipment/Supplies: Were Home Health Services Ordered?: No Any new equipment or medical supplies ordered?: No  Functional Questionnaire: Do you need assistance with bathing/showering or dressing?: No Do you need assistance with meal preparation?: No Do you need assistance with eating?: No Do you have difficulty maintaining continence: No Do you need assistance with getting out of bed/getting out of a chair/moving?: No Do you have difficulty managing or taking your medications?: No  Follow up  appointments reviewed: PCP Follow-up appointment confirmed?: No (Patient prefers to establish care in [place].) MD Provider Line Number:[phone] Given: Yes (RNCM assisted with scheduling with new PCP in [place], per patient request.  Scheduled with [facility] on 11/15/23 at 10:40am) Specialist Hospital Follow-up appointment confirmed?: Yes Date of Specialist follow-up appointment?: 11/19/23 Follow-Up Specialty Provider:: Merrily Do you need transportation to your follow-up appointment?: No (Patient's friend will provide transportation) Do you understand care options if your condition(s) worsen?: Yes-patient verbalized understanding  SDOH Interventions Today    Flowsheet Row Most Recent Value  SDOH Interventions   Food Insecurity Interventions Other (Comment)  [BSW referral placed]  Housing Interventions Other (Comment)  [BSW referral]  Transportation Interventions Other (Comment)  [BSW referral placed]  Utilities Interventions Other (Comment)  [BSW referral placed]  Depression Interventions/Treatment  Referral to Psychiatry  [Scheduled with [facility] on 11/19/23, advised patient to go to Behavioral Health Urgent Care.]    Goals Addressed             This Visit's Progress    VBCI Transitions of Care (TOC) Care Plan       Problems:  Recent Hospitalization for treatment of Suicidal Ideation and uncontrolled DMI Knowledge Deficit Related to community resources, Medication management barrier transportation to pick up medication prescriptions, and SDOH barrier: housing, food and utilities Patient discharged from [facility] on 11/12/23. Patient planned to return to his mother's home where he was living prior to admission. Upon return, his mother will no longer allow him to reside with her. He only has his insulin  pump and a full cartridge of insulin  with him. Some medications are at his mother's and other prescriptions need to be picked up at the pharmacy. He has applied for disability.  Goal:  Over the next 30 days, the patient will not experience hospital readmission  Interventions:  Transitions of Care: Durable Medical Equipment (DME) reviewed with patient/caregiver Doctor Visits  - discussed the importance of doctor  visits Communication with Tobias, [place] re: housing needs Arranged PCP follow-up within 7 days (Care Guide Scheduled) Medication review-advised patient to get any remaining medications from his mother's residence, he will ask a friend for a ride to the pharmacy to pick up prescriptions Assisted with scheduling hospital follow up visit with provider in [place]-per patient request, scheduled on 11/15/23 Educated patient on medical transportation provided by [facility], advised he call today to schedule for visit on 11/15/23 Advised taking all medications to PCP appointment Provided patient with information on 24 hour Behavioral Health Urgent Care Discussed the importance of keeping upcoming appointment with [facility] on 11/19/23 Provided education on DM management, discussed the importance of obtaining prescribed insulin  and taking as instructed-will review in detail at next outreach  Patient Self Care Activities:  Attend all scheduled provider appointments Call pharmacy for medication refills 3-7 days in advance of running out of medications Call provider office for new concerns or questions  Notify RN Care Manager of [facility] call rescheduling needs Participate in Transition of Care Program/Attend TOC scheduled calls Take medications as prescribed   Work with the social worker to address care coordination needs and will continue to work with the clinical team to address health care and disease management related needs  Plan:  Telephone follow up appointment with care management team member scheduled for:  11/20/23 at 10am        Andrea Dimes RN, BSN [place]  [facility] Health RN Care Manager [phone]

## 2023-11-13 NOTE — Patient Instructions (Signed)
 Visit Information  Thank you for taking time to visit with me today. Please don't hesitate to contact me if I can be of assistance to you before our next scheduled appointment.  Our next appointment is by telephone on 11/21/2023 at 2:00 pm Please call the care guide team at [phone] if you need to cancel or reschedule your appointment.   Following is a copy of your care plan:   Goals Addressed             This Visit's Progress    BSW VBCI Social Work Care Plan       Problems:   Housing   CSW Clinical Goal(s):   Over the next 1 weeks the Patient will will follow up with [employer] and disability and shelters in [place] as directed by Social Work.  Interventions:  Patient was provided shelter information   Patient Goals/Self-Care Activities:  Coordinate with [employer] and Disability case worker to assist with housing and disability.  Plan:   Telephone follow up appointment with care management team member scheduled for:  11/21/2023 at 2:00 pm        Please call the Suicide and Crisis Lifeline: 988 call the [place]  National Suicide Prevention Lifeline: (805)205-1722 or TTY: 208-591-9338 TTY 229-830-1712) to talk to a trained counselor call 1-800-273-TALK (toll free, 24 hour hotline) go to [facility] [address], [place] [phone]) call 911 if you are experiencing a Mental Health or Behavioral Health Crisis or need someone to talk to.  Patient verbalizes understanding of instructions and care plan provided today and agrees to view in MyChart. Active MyChart status and patient understanding of how to access instructions and care plan via MyChart confirmed with patient.     Tobias CHARM Maranda HEDWIG, PhD [facility], [facility] Social Worker Direct Dial: [phone]  Fax: [phone]

## 2023-11-13 NOTE — Patient Outreach (Signed)
 Complex Care Management   Visit Note  11/13/2023  Name:  Ronald Banks MRN: [id number] DOB: 21-Apr-1975  Situation: Referral received for Complex Care Management related to SDOH Barriers:  Housing homeless I obtained verbal consent from Patient.  Visit completed with Patient  on the phone  Background:   Past Medical History:  Diagnosis Date   ADHD (attention deficit hyperactivity disorder), inattentive type    Chronic midline low back pain with right-sided sciatica 02/11/2018   Chronic right shoulder pain 02/11/2018   DKA (diabetic ketoacidosis) 08/17/2020   DOE (dyspnea on exertion) 05/19/2019   Onset 2015/16 05/19/2019   Walked RA x two laps =  approx 514ft @ brisk pace - stopped due to end of study with sats of 98 % at the end of the study and c/o sob s cp  > rec CPST next  - alpha one AT  Screen   05/19/19 MM  152  - CPST  06/30/19  FEV1 3.79 (93%) with ratio 0.73 and nl f/v loop: ex study  probabably  wnl when wt factored  In  - sob with ventilatory reserve suggested sub max effort and de   Hyperlipidemia due to type 1 diabetes mellitus 04/18/2018   Hypertension associated with diabetes 04/18/2018   Trial off acei 04/07/2019 due to cough > improved by 07/03/2019 with just a typical smoker's rattle (no longer cough to point of gag/vomit)   Hypothyroidism    Major depressive disorder 10/09/2019   Moderate nonproliferative diabetic retinopathy of both eyes (HCC) 05/19/2019   Tobacco dependence 10/09/2019   Type 1 diabetes mellitus with diabetic polyneuropathy 04/18/2018    Assessment: Patient was just recently discharged from [facility] and found out that he can't stay with his mother, whom he had been staying with since he got out of prison in 2019. He stated that he is now homeless and slept in th woods last night. He is able to stay with a friend tonight, and his friend is going to discuss with his mother if Ronald Banks can stay at there home a few more nights until he can find a permanent  place to live. Patient stated that he is going to call his disability case worker to check the status of his case. And he will call call [employer] authority to check the stauts of his application that has been on the wait list for 2 years.    SDOH Interventions    Flowsheet Row Telephone from 11/13/2023 in [place] POPULATION HEALTH DEPARTMENT ED to Hosp-Admission (Discharged) from 10/31/2023 in [facility] Office Visit from 05/02/2023 in [facility] [place] - A Dept Of [place]. [facility] Office Visit from 03/07/2021 in [facility] - A Dept Of [place]. [facility] Office Visit from 01/09/2019 in [facility] [place] - A Dept Of [place]. [facility] Nutrition from 06/10/2018 in [facility] Health Nutrition & Diabetes Education Services at [facility]  SDOH Interventions        Food Insecurity Interventions Other (Comment)  [BSW referral placed] -- Intervention Not Indicated -- -- --  Housing Interventions Other (Comment)  [BSW referral] -- Intervention Not Indicated -- -- --  Transportation Interventions Other (Comment)  [BSW referral placed] -- Intervention Not Indicated -- -- --  Utilities Interventions Other (Comment)  [BSW referral placed] Inpatient TOC, Other (Comment)  [resources added to AVS] Intervention Not Indicated -- -- --  Alcohol Usage Interventions -- --  Intervention Not Indicated (Score <7) -- -- --  Depression Interventions/Treatment  Referral to Psychiatry  [Scheduled with [facility] on 11/19/23, advised patient to go to Behavioral Health Urgent Care.] -- -- Referral to Psychiatry Referral to Psychiatry --  lelan made to [facility]]  Financial Strain Interventions -- -- Intervention Not Indicated -- -- --  Physical Activity Interventions -- -- Patient Declined -- -- --  Social Connections Interventions -- Inpatient TOC, Other (Comment)  [resources added to AVS] Intervention  Not Indicated -- -- --  Health Literacy Interventions -- -- Intervention Not Indicated -- -- --      Recommendation:   none  Follow Up Plan:   Telephone follow up appointment date/time:  11/21/2023 at 2:00 pm  Tobias CHARM Maranda HEDWIG, PhD [facility], [facility] Social Worker Direct Dial: [phone]  Fax: [phone]

## 2023-11-15 ENCOUNTER — Ambulatory Visit: Payer: Self-pay

## 2023-11-15 ENCOUNTER — Inpatient Hospital Stay

## 2023-11-15 NOTE — Telephone Encounter (Signed)
 FYI Only or Action Required?: FYI only for provider.  Patient was last seen in primary care on 05/02/2023 by [name] ORN, NP.  Called Nurse Triage reporting Blood Sugar Problem.  Symptoms began today.  Interventions attempted: Prescription medications: insulin  pump.  Symptoms are: gradually worsening.  Triage Disposition: Go to ED Now (Notify PCP)  Patient/caregiver understands and will follow disposition?: Yes   Copied from CRM [id number]. Topic: Clinical - Red Word Triage >> Nov 15, 2023  2:24 PM Delon DASEN wrote: Red Word that prompted transfer to Nurse Triage: lightheaded, feels like blacking out Reason for Disposition  [1] Low blood sugar symptoms persist > 30 minutes AND [2] using low blood sugar Care Advice  Answer Assessment - Initial Assessment Questions Called 911. EMS arrived.  1. SYMPTOMS: What symptoms are you concerned about?     Reports feels like going in and out, don't remember 2. ONSET:  When did the symptoms start?     Since last night 3. BLOOD GLUCOSE: What is your blood glucose level?      Not able to check blood sugar    4. TYPE 1 or 2:  Do you know what type of diabetes you have?  (e.g., Type 1, Type 2, Gestational; doesn't know)      Type I 5. INSULIN : Do you take insulin ? What type of insulin (s) do you use? What is the mode of delivery? (syringe, pen; injection or pump) When did you last give yourself an insulin  dose? (i.e., time or hours/minutes ago) How much did you give? (i.e., how many units)     Insulin  pump 6. LOW BLOOD GLUCOSE TREATMENT: What have you done so far to treat the low blood glucose level?     Not sure if blood sugar low, unable to check 7. FOOD: When did you last eat or drink?       Last ate 2 hours 8. ALONE: Are you alone right now or is someone with you?        Friend near by, reports currently homeless Denies dizziness,  Intermittent dizziness, blacken vision, 3 minutes ago episodes,  Protocols used:  Diabetes - Low Blood Sugar-A-AH

## 2023-11-16 ENCOUNTER — Other Ambulatory Visit (HOSPITAL_COMMUNITY): Payer: Self-pay

## 2023-11-16 ENCOUNTER — Emergency Department
Admission: EM | Admit: 2023-11-16 | Discharge: 2023-11-16 | Disposition: A | Discharge status: Home / Self Care | Attending: Emergency Medicine | Admitting: Emergency Medicine

## 2023-11-16 ENCOUNTER — Other Ambulatory Visit: Payer: Self-pay

## 2023-11-16 DIAGNOSIS — I10 Essential (primary) hypertension: Secondary | ICD-10-CM | POA: Insufficient documentation

## 2023-11-16 DIAGNOSIS — E162 Hypoglycemia, unspecified: Secondary | ICD-10-CM | POA: Diagnosis present

## 2023-11-16 DIAGNOSIS — E10649 Type 1 diabetes mellitus with hypoglycemia without coma: Secondary | ICD-10-CM | POA: Diagnosis not present

## 2023-11-16 DIAGNOSIS — E109 Type 1 diabetes mellitus without complications: Secondary | ICD-10-CM

## 2023-11-16 LAB — CBC WITH DIFFERENTIAL/PLATELET
Abs Immature Granulocytes: 0.02 K/uL (ref 0.00–0.07)
Basophils Absolute: 0.1 K/uL (ref 0.0–0.1)
Basophils Relative: 1 %
Eosinophils Absolute: 0.1 K/uL (ref 0.0–0.5)
Eosinophils Relative: 1 %
HCT: 39.9 % (ref 39.0–52.0)
Hemoglobin: 13.4 g/dL (ref 13.0–17.0)
Immature Granulocytes: 0 %
Lymphocytes Relative: 24 %
Lymphs Abs: 1.7 K/uL (ref 0.7–4.0)
MCH: 31.5 pg (ref 26.0–34.0)
MCHC: 33.6 g/dL (ref 30.0–36.0)
MCV: 93.7 fL (ref 80.0–100.0)
Monocytes Absolute: 0.7 K/uL (ref 0.1–1.0)
Monocytes Relative: 9 %
Neutro Abs: 4.6 K/uL (ref 1.7–7.7)
Neutrophils Relative %: 65 %
Platelets: 235 K/uL (ref 150–400)
RBC: 4.26 MIL/uL (ref 4.22–5.81)
RDW: 12.6 % (ref 11.5–15.5)
WBC: 7.1 K/uL (ref 4.0–10.5)
nRBC: 0 % (ref 0.0–0.2)

## 2023-11-16 LAB — COMPREHENSIVE METABOLIC PANEL WITH GFR
ALT: 18 U/L (ref 0–44)
AST: 20 U/L (ref 15–41)
Albumin: 4 g/dL (ref 3.5–5.0)
Alkaline Phosphatase: 50 U/L (ref 38–126)
Anion gap: 9 (ref 5–15)
BUN: 9 mg/dL (ref 6–20)
CO2: 28 mmol/L (ref 22–32)
Calcium: 8.6 mg/dL — ABNORMAL LOW (ref 8.9–10.3)
Chloride: 99 mmol/L (ref 98–111)
Creatinine, Ser: 0.75 mg/dL (ref 0.61–1.24)
GFR, Estimated: >60 mL/min (ref >60)
Glucose, Bld: 141 mg/dL — ABNORMAL HIGH (ref 70–99)
Potassium: 3.3 mmol/L — ABNORMAL LOW (ref 3.5–5.1)
Sodium: 136 mmol/L (ref 135–145)
Total Bilirubin: 0.8 mg/dL (ref 0.0–1.2)
Total Protein: 6.9 g/dL (ref 6.5–8.1)

## 2023-11-16 LAB — CBG MONITORING, ED
Glucose-Capillary: 113 mg/dL — ABNORMAL HIGH (ref 70–99)
Glucose-Capillary: 215 mg/dL — ABNORMAL HIGH (ref 70–99)
Glucose-Capillary: 249 mg/dL — ABNORMAL HIGH (ref 70–99)
Glucose-Capillary: 281 mg/dL — ABNORMAL HIGH (ref 70–99)
Glucose-Capillary: 267 mg/dL — ABNORMAL HIGH (ref 70–99)
Glucose-Capillary: 188 mg/dL — ABNORMAL HIGH (ref 70–99)
Glucose-Capillary: 130 mg/dL — ABNORMAL HIGH (ref 70–99)

## 2023-11-16 LAB — URINALYSIS, ROUTINE W REFLEX MICROSCOPIC
Bilirubin Urine: NEGATIVE
Glucose, UA: 50 mg/dL — AB
Hgb urine dipstick: NEGATIVE
Ketones, ur: NEGATIVE mg/dL
Leukocytes,Ua: NEGATIVE
Nitrite: NEGATIVE
Protein, ur: NEGATIVE mg/dL
Specific Gravity, Urine: 1.004 — ABNORMAL LOW (ref 1.005–1.030)
pH: 6 (ref 5.0–8.0)

## 2023-11-16 LAB — TROPONIN I (HIGH SENSITIVITY)
Troponin I (High Sensitivity): 3 ng/L (ref <18)
Troponin I (High Sensitivity): <2 ng/L (ref <18)

## 2023-11-16 LAB — ACETAMINOPHEN LEVEL: Acetaminophen (Tylenol), Serum: <10 ug/mL — ABNORMAL LOW (ref 10–30)

## 2023-11-16 LAB — MAGNESIUM: Magnesium: 2.1 mg/dL (ref 1.7–2.4)

## 2023-11-16 LAB — SALICYLATE LEVEL: Salicylate Lvl: <7 mg/dL — ABNORMAL LOW (ref 7.0–30.0)

## 2023-11-16 MED ORDER — KETOROLAC TROMETHAMINE 15 MG/ML IJ SOLN
15.0000 mg | Freq: Once | INTRAMUSCULAR | Status: AC
Start: 2023-11-16 — End: 2023-11-16
  Administered 2023-11-16: 15 mg via INTRAVENOUS
  Filled 2023-11-16: qty 1

## 2023-11-16 MED ORDER — INSULIN ASPART 100 UNIT/ML IJ SOLN
0.0000 [IU] | INTRAMUSCULAR | Status: DC
  Administered 2023-11-16: 3 [IU] via SUBCUTANEOUS
  Filled 2023-11-16: qty 3

## 2023-11-16 MED ORDER — ACETAMINOPHEN 500 MG PO TABS
1000.0000 mg | ORAL_TABLET | Freq: Once | ORAL | Status: AC
  Administered 2023-11-16: 1000 mg via ORAL
  Filled 2023-11-16: qty 2

## 2023-11-16 MED ORDER — OXYCODONE HCL 5 MG PO TABS
5.0000 mg | ORAL_TABLET | Freq: Once | ORAL | Status: DC
Start: 2023-11-16 — End: 2023-11-16

## 2023-11-16 MED ORDER — LIDOCAINE 5 % EX PTCH
1.0000 | MEDICATED_PATCH | CUTANEOUS | Status: DC
  Administered 2023-11-16: 1 via TRANSDERMAL
  Filled 2023-11-16: qty 1

## 2023-11-16 NOTE — ED Triage Notes (Addendum)
 Pt arrived via [facility] ([facility]) from a friend's house with hypoglycemia. EMS reports CBG was 25. EMS gave 25g of D10, a PB & J sandwich and some milk. EMS states this is the 3rd time in 24 hrs that he's been hypoglycemic but refused to come to the hospital the other two times.  Pt states he's a type 1 diabetic and has an insulin  pump that is turned off.  Updated CBG from EMS and upon arrival is 125.

## 2023-11-16 NOTE — Telephone Encounter (Signed)
FYI Patient currently in the ED.

## 2023-11-16 NOTE — Inpatient Diabetes Management (Addendum)
 Inpatient Diabetes Program Recommendations  AACE/ADA: New Consensus Statement on Inpatient Glycemic Control (2015)  Target Ranges:  Prepandial:   less than 140 mg/dL      Peak postprandial:   less than 180 mg/dL (1-2 hours)      Critically ill patients:  140 - 180 mg/dL    Latest Reference Range & Units 10/31/23 09:51  Hemoglobin A1C 4.8 - 5.6 % 9.8 (H)  (H): Data is abnormally high  Latest Reference Range & Units 11/16/23 06:55 11/16/23 08:53 11/16/23 09:42  Glucose-Capillary 70 - 99 mg/dL 886 (H) 784 (H) 750 (H)  (H): Data is abnormally high    To ED with Hypoglycemia--EMS reported CBG was 25 EMS gave 25g of D10, a PB & J sandwich and some milk  EMS states this is the 3rd time in 24 hrs that he's been hypoglycemic but refused to come to the hospital the other two times   History: Type 1 diabetes (dxd 1997), Polysubstance Abuse  Home DM Meds: Insulin  Pump  Current Orders: Novolog  0-6 units Q4H   Pt was just admitted from 10/08 thru 10/20 Was having issues with Hypoglycemia Transferred to [facility] b/c of SI Tox Screen 10/31/2023 was + for Cocaine, Marijuana, ETOH level was 112  Insulin  Pump turned off at 3pm 10/23 per pt report Question timing since CBG this AM was 113??  Met w/ pt down in the ED.  Pt was A&O and able to answer all my questions.  Stated he unhooked his insulin  pump yesterday 10/23 at 3pm.  Question timing of removal of pump--If he had removed it yesterday his CBGs would likely be higher since he has Type 1 diabetes and would not have been getting any insulin  for >12 hours.  Pt told me his brother went to the pharmacy to pick up his Rxs and the Pharmacy did not have any Dexcom G7 glucose sensors to pick up so he never started a sensor.  I reviewed his pump settings and they match the settings from Dr. Kathie note from 10/25/2023.  The Control-IQ function on pt's pump will not be able to work without the Dexcom G7 sensor and I did warn pt about that.  Pt told me he  drank about 3 beers last PM and went to bed--Stated he has had a lot of stress.  I reminded pt that drinking ETOH impairs the ability of the liver to produce glucose when glucose levels are low.  I placed a new Dexcom G7 glucose sensor on pt's R arm and helped pt to pair the new sensor to his pump.  I have called Dr. Kathie office in [facility] to alert Dr. Cherilyn to this info and see if Dr. Cherilyn wants to make any adjustments to pt's pump--MA to give MD this info and contact me back.  Reviewed all the above with Dr. Ernest here in the ED.  Called [facility] pharmacy--They were not able to fill his Dexcom glucose sensors b/c of the need for a PA to be completed.  Pre Pharmacy team, PA has been started.  I also reached out to our OP pharmacy team to check on the status of the PA.   Addendum 11:30am--[facility] sent me a chat back and told me the PA for the Dexcom Glucose sensor cannot be completed until pt calls his Medicaid social worker to get the primary insurance removed--Medicaid thinks he has a primary insurance--His primary terminated on 02/23/2023--Medicaid will not approve the PA until the primary insurance is removed.  Addendum 12pm--CBG 267 at 12pm.  Assisted pt to restart his insulin  pump at this time per orders from Dr. Ernest.  Still have not heard back from ENDO office yet.  Instructed pt to call his Medicaid Case worker to get primary insurance removed so Medicaid will approve the PA for the Dexcom CGM.  Addendum 1:30pm--CBG at 1pm was 188.  Insulin  pump has been on and running since 12pm.  Pt showed me his pump and sensor reading and sensor reading was down to 88 mg/dl.  Per pt report, he never did drop below 80 mg/dl in the last hour.  I double checked and the Control IQ function on pt's pump is turned on so it should be working.  Pt also had lunch tray he was eating--Dexcom reading rose to 104 during the time I was in the room--pt told me he did not bolus for his lunch b/c his reading was  down to 88.  ENDO office has not returned my call yet.  I gave pt another sample Dexcom G7 sensor to take home and use and I strongly encouraged pt to call his Medicaid case worker to get the issue straightened out with the PA on his Dexcom and his insurance coverage.  Pt stated he would call as soon as he is discharged.  Also strongly encouraged pt to call ENDO office Monday if he continues to have issues with HYPO at home as he may need insulin  pump adjustments.    Addendum 2:10pm--ENDO office called me back.  MA for ENDO told me Dr. Cherilyn does not want to make any changes to pt's insulin  pump at this time.  ENDO feels having the Dexcom sensor on with the Control IQ will help.  Have pt call ENDO office if continues to have issues with HYPO (which I already asked pt to do when I last spoke with him at 1:30pm).  All of the above info reviewed with Dr. Ernest in the ED.     ENDO: Dr. Muriel Last Seen 10/25/2023 Dexcom G7 sent to pharmacy for pt to start He is on NovoLog  in the T slim control IQ pump:  Basal rates Midnight = 1.4 4 AM = 1.5 8 AM = 1.35 1 PM = 1.45 TDD basal: 34.3 units  Bolus settings I/C: 8 ISF: 40 Target Glucose: 110 Active insulin  time: 5 hours     --Will follow patient during hospitalization--  Adina Rudolpho Arrow RN, MSN, CDCES Diabetes Coordinator Inpatient Glycemic Control Team Team Pager: [phone] (8a-5p)

## 2023-11-16 NOTE — Discharge Instructions (Signed)
 Your glucose monitor was placed back onto your skin and link to your insulin  pump so this should help prevent your sugars from going lower you should call your endocrine doctor to make a follow-up appointment return to the ER if you develop recurrent hypoglycemia or any other concerns

## 2023-11-16 NOTE — ED Provider Notes (Signed)
 [facility] Provider Note    Event Date/Time   First MD Initiated Contact with Patient 11/16/23 [phone]     (approximate)   History   Hypoglycemia   HPI  MACLEAN FOISTER is a 48 y.o. male with a history of EtOH abuse, type 1 diabetes, hypertension who comes in with low sugars.  I reviewed the hospital discharge summary from 11/06/2023 for patient had ethanol level of 112 was positive for cocaine, amphetamines and cannabinoids.  He was initially treated with IV dextrose  and then followed by 10% dextrose  due to recurrent episodes of hypoglycemia.  There was then concern that patient had done this with SI and so he later underwent a psychiatric admission from 10/14 until 7/20.  He comes in today due to concerns for hypoglycemia.  With EMS glucose was 25 EMS gave 25 g of D10 pita butter and jelly sandwich and some milk.  Patient has been hypoglycemic 3 times in the past 24 hours but refused to come to the emergency room.  Patient denies that he made any attempts to try to hurt himself.  He reports that the reason his sugars have been continuously going low is that he ran out of his device that monitors his sugars so he is just on a basilar insulin .  Does report eating but did report drinking some alcohol yesterday as well.  He reports having 2 prior episodes of hypoglycemia but at those times he did not want to come to the emergency room.  He decided today that he would come into the emergency room.  He denies any SI.  He denies any other symptoms.     Physical Exam   Triage Vital Signs: ED Triage Vitals  Encounter Vitals Group     BP 11/16/23 0659 (!) 144/75     Girls Systolic BP Percentile --      Girls Diastolic BP Percentile --      Boys Systolic BP Percentile --      Boys Diastolic BP Percentile --      Pulse Rate 11/16/23 0659 71     Resp 11/16/23 0659 19     Temp 11/16/23 0659 97.9 F (36.6 C)     Temp Source 11/16/23 0659 Oral     SpO2 11/16/23 0659 99 %      Weight 11/16/23 0700 185 lb (83.9 kg)     Height 11/16/23 0700 5' 10 (1.778 m)     Head Circumference --      Peak Flow --      Pain Score 11/16/23 0700 8     Pain Loc --      Pain Education --      Exclude from Growth Chart --     Most recent vital signs: Vitals:   11/16/23 0659  BP: (!) 144/75  Pulse: 71  Resp: 19  Temp: 97.9 F (36.6 C)  SpO2: 99%     General: Awake, no distress.  CV:  Good peripheral perfusion.  Resp:  Normal effort.  Abd:  No distention.  Soft and nontender Other:  Insulin  pump was turned off Patient moving all extremities   ED Results / Procedures / Treatments   Labs (all labs ordered are listed, but only abnormal results are displayed) Labs Reviewed  COMPREHENSIVE METABOLIC PANEL WITH GFR - Abnormal; Notable for the following components:      Result Value   Potassium 3.3 (*)    Glucose, Bld 141 (*)    Calcium   8.6 (*)    All other components within normal limits  SALICYLATE LEVEL - Abnormal; Notable for the following components:   Salicylate Lvl <7.0 (*)    All other components within normal limits  ACETAMINOPHEN  LEVEL - Abnormal; Notable for the following components:   Acetaminophen  (Tylenol ), Serum <10 (*)    All other components within normal limits  URINALYSIS, ROUTINE W REFLEX MICROSCOPIC - Abnormal; Notable for the following components:   Color, Urine STRAW (*)    APPearance CLEAR (*)    Specific Gravity, Urine 1.004 (*)    Glucose, UA 50 (*)    All other components within normal limits  CBG MONITORING, ED - Abnormal; Notable for the following components:   Glucose-Capillary 113 (*)    All other components within normal limits  CBG MONITORING, ED - Abnormal; Notable for the following components:   Glucose-Capillary 215 (*)    All other components within normal limits  CBG MONITORING, ED - Abnormal; Notable for the following components:   Glucose-Capillary 249 (*)    All other components within normal limits  CBG  MONITORING, ED - Abnormal; Notable for the following components:   Glucose-Capillary 281 (*)    All other components within normal limits  CBC WITH DIFFERENTIAL/PLATELET  MAGNESIUM  TROPONIN I (HIGH SENSITIVITY)  TROPONIN I (HIGH SENSITIVITY)     EKG  My interpretation of EKG:  Sinus rate of 70 without any ST elevation or T wave inversions, normal intervals   PROCEDURES:  Critical Care performed: No  Procedures   MEDICATIONS ORDERED IN ED: Medications - No data to display   IMPRESSION / MDM / ASSESSMENT AND PLAN / ED COURSE  I reviewed the triage vital signs and the nursing notes.   Patient's presentation is most consistent with acute presentation with potential threat to life or bodily function.   Patient comes in with concerns for episode of hypoglycemia in the setting of not having a monitor to monitor his sugars that would be linked to his insulin  pump.  Insulin  pump was turned off and patient we monitor for additional episodes of hypoglycemia.  Will check labs to evaluate for Electra abnormalities, AKI no chest pain or shortness of breath will screen for ACS.  Patient's sugars have been steadily uptrending since discontinuation of his insulin  pump.  His troponin was negative unlikely ACS CBC reassuring CMP without any evidence of AKI.  I discussed the case with the diabetes coordinator who is come down to discuss with patient and provided him a new glucose monitor to link to his insulin  pump.  She is trying to discuss the case with patient's endocrine doctor as well to see if they need to adjust the settings but given this was probably more likely related to not having the glucose monitor and also secondary his alcohol use she opted not to change the settings of his insulin  pump at this point.  She also had denied any concerns that patient had tried to hurt himself during her conversation with patient.  Patient's pump was restarted and patient was monitored  Patient did  report having some chronic pain in his back.  This is been ongoing for years.  Patient is still able to ambulate move his extremities.  Denies any numbness.  Patient ordered some Tylenol , Toradol , lidocaine  patch to help with it.  No signs of cord compression.  11:05 AM plan is to recheck a sugar at noon and if it is over 150 to restart the pump.  She  is still waiting to try to hear back from patient's doctor.  12:12 PM repeat sugar was reassuring therefore pump was restarted  Patient's sugars after pumping restarted have been reassuring.  Discussed the case with the diabetes coordinator she was able to get a hold of his diabetes team who did not want to make changes to the pump and felt comfortable with patient going home.  Patient's been monitored in the emergency room for multiple hours without recurrent episode of hypoglycemia and the pump has now been relinked to a glucose monitor that is attached to patient.   FINAL CLINICAL IMPRESSION(S) / ED DIAGNOSES   Final diagnoses:  Hypoglycemia  Type 1 diabetes mellitus without complications (HCC)     Rx / DC Orders   ED Discharge Orders     None        Note:  This document was prepared using Dragon voice recognition software and may include unintentional dictation errors.   Ernest Ronal BRAVO, MD 11/16/23 1440

## 2023-11-20 ENCOUNTER — Telehealth: Payer: Self-pay | Admitting: *Deleted

## 2023-11-20 ENCOUNTER — Encounter: Payer: Self-pay | Admitting: *Deleted

## 2023-11-21 ENCOUNTER — Telehealth: Payer: Self-pay | Admitting: *Deleted

## 2023-11-21 ENCOUNTER — Other Ambulatory Visit: Payer: Self-pay | Admitting: Licensed Clinical Social Worker

## 2023-11-21 NOTE — Transitions of Care (Post Inpatient/ED Visit) (Signed)
 Transition of Care week 2  Visit Note  11/21/2023  Name: Ronald Banks MRN: [id number]          DOB: 11-Jun-1975  Situation: Patient enrolled in [facility] 30-day program. Visit completed with Mr. Enriques by telephone.   Background:   Initial Transition Care Management Follow-up Telephone Call Discharge Date and Diagnosis: 11/12/23, Suicide attempt   Past Medical History:  Diagnosis Date   ADHD (attention deficit hyperactivity disorder), inattentive type    Chronic midline low back pain with right-sided sciatica 02/11/2018   Chronic right shoulder pain 02/11/2018   DKA (diabetic ketoacidosis) 08/17/2020   DOE (dyspnea on exertion) 05/19/2019   Onset 2015/16 05/19/2019   Walked RA x two laps =  approx 543ft @ brisk pace - stopped due to end of study with sats of 98 % at the end of the study and c/o sob s cp  > rec CPST next  - alpha one AT  Screen   05/19/19 MM  152  - CPST  06/30/19  FEV1 3.79 (93%) with ratio 0.73 and nl f/v loop: ex study  probabably  wnl when wt factored  In  - sob with ventilatory reserve suggested sub max effort and de   Hyperlipidemia due to type 1 diabetes mellitus 04/18/2018   Hypertension associated with diabetes 04/18/2018   Trial off acei 04/07/2019 due to cough > improved by 07/03/2019 with just a typical smoker's rattle (no longer cough to point of gag/vomit)   Hypothyroidism    Major depressive disorder 10/09/2019   Moderate nonproliferative diabetic retinopathy of both eyes (HCC) 05/19/2019   Tobacco dependence 10/09/2019   Type 1 diabetes mellitus with diabetic polyneuropathy 04/18/2018    Assessment: Patient Reported Symptoms: Cognitive Cognitive Status: Able to follow simple commands, Alert and oriented to person, place, and time, Normal speech and language skills      Neurological Neurological Review of Symptoms: No symptoms reported    HEENT HEENT Symptoms Reported: Not assessed      Cardiovascular Cardiovascular Symptoms Reported: No symptoms  reported    Respiratory Respiratory Symptoms Reported: No symptoms reported    Endocrine Endocrine Symptoms Reported: No symptoms reported Is patient diabetic?: Yes Is patient checking blood sugars at home?: Yes List most recent blood sugar readings, include date and time of day: Current BS 144 Endocrine Self-Management Outcome: 4 (good) Endocrine Comment: Patient using Dexcom to monitor BS, Novolog  via insulin  pump.  Gastrointestinal Gastrointestinal Symptoms Reported: No symptoms reported      Genitourinary Genitourinary Symptoms Reported: Not assessed    Integumentary Integumentary Symptoms Reported: Not assessed    Musculoskeletal Musculoskelatal Symptoms Reviewed: Back pain Musculoskeletal Self-Management Outcome: 3 (uncertain)      Psychosocial Psychosocial Symptoms Reported: Not assessed         There were no vitals filed for this visit.  Medications Reviewed Today     Reviewed by Lucky Andrea LABOR, RN (Registered Nurse) on 11/21/23 at 1322  Med List Status: <None>   Medication Order Taking? Sig Documenting Provider Last Dose Status Informant  atorvastatin  (LIPITOR) 10 MG tablet [id number] Yes Take 10 mg by mouth daily. [provider]  Active Other  DULoxetine  (CYMBALTA ) 30 MG capsule [id number]  Take 3 capsules (90 mg total) by mouth daily.  Patient not taking: Reported on 11/21/2023   Jadapalle, Sree, MD  Active   folic acid (FOLVITE) 1 MG tablet [id number]  Take 1 tablet (1 mg total) by mouth daily.  Patient not taking: Reported on 11/21/2023  Jadapalle, Sree, MD  Active   insulin  aspart (NOVOLOG ) 100 UNIT/ML injection [id number]  Inject 0-5 Units into the skin at bedtime. Jens Durand, MD  Active   insulin  aspart (NOVOLOG ) 100 UNIT/ML injection [id number]  Inject 0-9 Units into the skin 3 (three) times daily with meals. Jens Durand, MD  Active   insulin  aspart (NOVOLOG ) 100 UNIT/ML injection [id number] Yes Inject 8 Units into the skin 3 (three) times daily  with meals. Jadapalle, Sree, MD  Active   insulin  glargine (LANTUS) 100 UNIT/ML injection [id number]  Inject 0.2 mLs (20 Units total) into the skin daily.  Patient not taking: Reported on 11/21/2023   Jadapalle, Sree, MD  Active   levothyroxine  (SYNTHROID ) 125 MCG tablet [id number] Yes Take 2 tablets by mouth daily. [provider]  Active Other  Multiple Vitamin (MULTIVITAMIN WITH MINERALS) TABS tablet [id number] Yes Take 1 tablet by mouth daily. Jadapalle, Sree, MD  Active   thiamine (VITAMIN B-1) 100 MG tablet [id number]  Take 1 tablet (100 mg total) by mouth daily.  Patient not taking: Reported on 11/21/2023   Donnelly Mellow, MD  Active            Today Mr. Gossard was at court, reports being on break outside smoking. Outreach with RNCM was limited based on time restraint. RNCM will follow up with Patient on 11/28/23 at 1:45pm  Recommendation:   Continue Current Plan of Care  Follow Up Plan:   Telephone follow-up in 1 week  Andrea Dimes RN, BSN [place]  [facility] RN Care Manager [phone]

## 2023-11-21 NOTE — Patient Instructions (Addendum)
 Visit Information  Thank you for taking time to visit with me today. Please don't hesitate to contact me if I can be of assistance to you before our next scheduled telephone appointment.   Following is a copy of your care plan:   Goals Addressed             This Visit's Progress    VBCI Transitions of Care (TOC) Care Plan       Problems:  Recent Hospitalization for treatment of Suicidal Ideation and uncontrolled DMI Knowledge Deficit Related to community resources, Medication management barrier transportation to pick up medication prescriptions, and SDOH barrier: housing, food and utilities Patient reports missing call with RNCM due to sleeping all day, staying with a friend. Pt is currently at court and outside smoking. Reports good BS management  Goal:  Over the next 30 days, the patient will not experience hospital readmission  Interventions:  Transitions of Care: Doctor Visits  - discussed the importance of doctor visits Medication review-discussed the importance of compliance Educated patient on medical transportation provided by [facility] [phone] Advised follow up with PCP, provided with MD referral line [phone] if desiring to establish with new provider  Provided patient with information on 24 hour Behavioral Health Urgent Care Discussed the importance of keeping upcoming appointment with [facility] on 11/19/23-patient did not keep appointment due to transportation issue Verified patient working with BSW for resources Reviewed telephone visit with BSW today at 2pm-patient hoping to be out of court by this time Discussed the importance of attending follow up with Endocrinology on 11/28/23   Patient Self Care Activities:  Attend all scheduled provider appointments Call pharmacy for medication refills 3-7 days in advance of running out of medications Call provider office for new concerns or questions  Notify RN Care Manager of [facility] call rescheduling needs Participate  in Transition of Care Program/Attend TOC scheduled calls Take medications as prescribed   Work with the social worker to address care coordination needs and will continue to work with the clinical team to address health care and disease management related needs  Plan:  Telephone follow up appointment with care management team member scheduled for:  11/28/23 at 1:45pm        Patient verbalizes understanding of instructions and care plan provided today and agrees to view in MyChart. Active MyChart status and patient understanding of how to access instructions and care plan via MyChart confirmed with patient.     Telephone follow up appointment with care management team member scheduled for:11/28/23 at 1:45pm  Please call the care guide team at [phone] if you need to cancel or reschedule your appointment.   Please call 1-800-273-TALK (toll free, 24 hour hotline) go to [facility]gent [facility] [address] [place] [phone]) call 911 if you are experiencing a Mental Health or Behavioral Health Crisis or need someone to talk to.  Andrea Dimes RN, BSN [place]  [facility] RN Care Manager [phone]

## 2023-11-26 ENCOUNTER — Encounter: Payer: Self-pay | Admitting: Radiology

## 2023-11-28 ENCOUNTER — Other Ambulatory Visit: Payer: Self-pay | Admitting: *Deleted

## 2023-11-28 NOTE — Patient Instructions (Signed)
 Visit Information  Thank you for taking time to visit with me today. Please don't hesitate to contact me if I can be of assistance to you before our next scheduled telephone appointment.   Following is a copy of your care plan:   Goals Addressed             This Visit's Progress    VBCI Transitions of Care (TOC) Care Plan       Problems:  Recent Hospitalization for treatment of Suicidal Ideation and uncontrolled DMI Knowledge Deficit Related to community resources, Medication management barrier transportation to pick up medication prescriptions, and SDOH barrier: housing, food and utilities  Goal:  Over the next 30 days, the patient will not experience hospital readmission  Interventions:  Transitions of Care: Doctor Visits  - discussed the importance of doctor visits Medication review-discussed the importance of compliance Educated patient on medical transportation provided by [facility] [phone] Advised follow up with PCP, provided with MD referral line [phone] if desiring to establish with new provider -Revisited Provided patient with information on 24 hour Behavioral Health Urgent Care Discussed the importance of attending follow up with Endocrinology on 11/28/23-patient had to cancel due to meeting his probation officer Advised rescheduling with Endocrinology BS reviewed   Patient Self Care Activities:  Attend all scheduled provider appointments Call pharmacy for medication refills 3-7 days in advance of running out of medications Call provider office for new concerns or questions  Notify RN Care Manager of [facility] call rescheduling needs Participate in Transition of Care Program/Attend TOC scheduled calls Take medications as prescribed   Work with the social worker to address care coordination needs and will continue to work with the clinical team to address health care and disease management related needs  Plan:  Telephone follow up appointment with care  management team member scheduled for:  12/05/23 at 1:15pm        Patient verbalizes understanding of instructions and care plan provided today and agrees to view in MyChart. Active MyChart status and patient understanding of how to access instructions and care plan via MyChart confirmed with patient.     Telephone follow up appointment with care management team member scheduled for:12/05/23 at 1:15pm  Please call the care guide team at [phone] if you need to cancel or reschedule your appointment.   Please call 1-800-273-TALK (toll free, 24 hour hotline) call 911 if you are experiencing a Mental Health or Behavioral Health Crisis or need someone to talk to.  Andrea Dimes RN, BSN [place]  [facility] RN Care Manager [phone]

## 2023-11-28 NOTE — Transitions of Care (Post Inpatient/ED Visit) (Signed)
  Transition of Care week 4  Visit Note  11/28/2023  Name: Ronald Banks MRN: [id number]          DOB: 1976/01/07  Situation: Patient enrolled in [facility] 30-day program. Visit completed with Mr. Pretty by telephone.   Background:   Initial Transition Care Management Follow-up Telephone Call Discharge Date and Diagnosis: 11/12/23, Suicide attempt   Past Medical History:  Diagnosis Date   ADHD (attention deficit hyperactivity disorder), inattentive type    Chronic midline low back pain with right-sided sciatica 02/11/2018   Chronic right shoulder pain 02/11/2018   DKA (diabetic ketoacidosis) 08/17/2020   DOE (dyspnea on exertion) 05/19/2019   Onset 2015/16 05/19/2019   Walked RA x two laps =  approx 513ft @ brisk pace - stopped due to end of study with sats of 98 % at the end of the study and c/o sob s cp  > rec CPST next  - alpha one AT  Screen   05/19/19 MM  152  - CPST  06/30/19  FEV1 3.79 (93%) with ratio 0.73 and nl f/v loop: ex study  probabably  wnl when wt factored  In  - sob with ventilatory reserve suggested sub max effort and de   Hyperlipidemia due to type 1 diabetes mellitus 04/18/2018   Hypertension associated with diabetes 04/18/2018   Trial off acei 04/07/2019 due to cough > improved by 07/03/2019 with just a typical smoker's rattle (no longer cough to point of gag/vomit)   Hypothyroidism    Major depressive disorder 10/09/2019   Moderate nonproliferative diabetic retinopathy of both eyes (HCC) 05/19/2019   Tobacco dependence 10/09/2019   Type 1 diabetes mellitus with diabetic polyneuropathy 04/18/2018    Assessment: Patient Reported Symptoms: Cognitive Cognitive Status: Able to follow simple commands, Alert and oriented to person, place, and time, Normal speech and language skills      Neurological Neurological Review of Symptoms: Not assessed    HEENT HEENT Symptoms Reported: Not assessed      Cardiovascular Cardiovascular Symptoms Reported: Not assessed    Respiratory  Respiratory Symptoms Reported: No symptoms reported    Endocrine Endocrine Symptoms Reported: No symptoms reported Is patient diabetic?: Yes Is patient checking blood sugars at home?: Yes List most recent blood sugar readings, include date and time of day: Current BS 155 Endocrine Self-Management Outcome: 4 (good)  Gastrointestinal Gastrointestinal Symptoms Reported: Not assessed      Genitourinary Genitourinary Symptoms Reported: Not assessed    Integumentary Integumentary Symptoms Reported: Not assessed    Musculoskeletal Musculoskelatal Symptoms Reviewed: Not assessed        Psychosocial Psychosocial Symptoms Reported: Not assessed         There were no vitals filed for this visit.  Medications Reviewed Today   Medications were not reviewed in this encounter    Patient was at his Probation Officer appointment and unable to complete TOC Assessment and Medication Review.  Recommendation:   Continue Current Plan of Care  Follow Up Plan:   Telephone follow-up in 1 week  Andrea Dimes RN, BSN [place]  [facility] RN Care Manager [phone]

## 2023-12-03 ENCOUNTER — Other Ambulatory Visit: Payer: Self-pay | Admitting: Licensed Clinical Social Worker

## 2023-12-04 NOTE — Patient Instructions (Signed)
 Visit Information  Thank you for taking time to visit with me today. Please don't hesitate to contact me if I can be of assistance to you before our next scheduled appointment.  Your next care management appointment is by telephone on 12/05/2023 at 11:30 am    Please call the care guide team at [phone] if you need to cancel, schedule, or reschedule an appointment.   Please call the Suicide and Crisis Lifeline: 988 go to [facility] [address], [place] [phone]) call 911 if you are experiencing a Mental Health or Behavioral Health Crisis or need someone to talk to.  Tobias CHARM Maranda HEDWIG, PhD [facility], [facility] Social Worker Direct Dial: [phone]  Fax: [phone]

## 2023-12-04 NOTE — Patient Outreach (Addendum)
 Social Drivers of Health  Community Resource and Care Coordination Visit Note   12/04/2023  Name: Ronald Banks MRN: [id number] DOB:02-18-1975  Situation: Referral received for [facility] needs assessment and assistance related to [employer] . I obtained verbal consent from Patient.  Visit completed with Patient on the phone.   Background:   SDOH Interventions Today    Flowsheet Row Most Recent Value  SDOH Interventions   Food Insecurity Interventions Community Resources Provided  [patient has food stamps but limitied and is also homeless]  Housing Interventions Statistician Referral, Community Resources Provided  [employer] list]  Transportation [employer] Resources Provided  Utilities Interventions Intervention Not Indicated     Assessment:   Goals Addressed             This Visit's Progress    BSW VBCI Social Work Care Plan       Problems:   Housing   CSW Clinical Goal(s):   Over the next 1 weeks the Patient will will follow up with [employer] and disability and shelters in [place] as directed by Social Work.  Interventions:  Patient was provided shelter information   Patient Goals/Self-Care Activities:  Coordinate with [employer] and Disability case worker to assist with housing and disability.  Plan:   Telephone follow up appointment with care management team member scheduled for:  12/05/2023 at 11:30 am        Recommendation:   follow up with [facility] authority and seek family in the area regarding housing needs. Patient has tried to call and text his mother but she will not respond and he stayed with a close friend and the friends mother but could no longer stay there due to the limited space in the home.   Follow Up Plan:   Telephone follow up appointment date/time:  12/05/2023 at 11:30 am  Tobias CHARM Maranda HEDWIG, PhD [facility], [facility] Social Worker Direct  Dial: [phone]  Fax: [phone]

## 2023-12-05 ENCOUNTER — Other Ambulatory Visit: Payer: Self-pay | Admitting: *Deleted

## 2023-12-05 ENCOUNTER — Other Ambulatory Visit: Payer: Self-pay | Admitting: Licensed Clinical Social Worker

## 2023-12-05 DIAGNOSIS — F339 Major depressive disorder, recurrent, unspecified: Secondary | ICD-10-CM

## 2023-12-05 NOTE — Patient Instructions (Signed)
 Visit Information  Thank you for taking time to visit with me today. Please don't hesitate to contact me if I can be of assistance to you before our next scheduled telephone appointment.   Following is a copy of your care plan:   Goals Addressed             This Visit's Progress    VBCI Transitions of Care (TOC) Care Plan       Problems:  Recent Hospitalization for treatment of Suicidal Ideation and uncontrolled DMI Knowledge Deficit Related to community resources, Medication management barrier transportation to pick up medication prescriptions, and SDOH barrier: housing, food and utilities  Goal:  Over the next 30 days, the patient will not experience hospital readmission  Interventions:  Transitions of Care: Doctor Visits  - discussed the importance of doctor visits Medication review-discussed the importance of compliance Educated patient on how to have prescriptions transferred to a pharmacy close by Advised requesting refills prior to running out of medications Educated patient on medical transportation provided by [facility] [phone] Advised follow up with PCP, provided with MD referral line [phone] if desiring to establish with new provider -Revisited Advised rescheduling with Endocrinology-made patient aware appointment scheduled for 02/14/24 BS reviewed Referral to LCSW for managing depression Provided patient with [facility] [phone] to call for appointment   Patient Self Care Activities:  Attend all scheduled provider appointments Call pharmacy for medication refills 3-7 days in advance of running out of medications Call provider office for new concerns or questions  Notify RN Care Manager of [facility] call rescheduling needs Participate in Transition of Care Program/Attend TOC scheduled calls Take medications as prescribed   Work with the social worker to address care coordination needs and will continue to work with the clinical team to address health care and  disease management related needs  Plan:  Telephone follow up appointment with care management team member scheduled for:  12/12/23 at 1pm        Patient verbalizes understanding of instructions and care plan provided today and agrees to view in MyChart. Active MyChart status and patient understanding of how to access instructions and care plan via MyChart confirmed with patient.     Telephone follow up appointment with care management team member scheduled for:12/12/23 at 1pm  Please call the care guide team at [phone] if you need to cancel or reschedule your appointment.   Please call 1-800-273-TALK (toll free, 24 hour hotline) call 911 if you are experiencing a Mental Health or Behavioral Health Crisis or need someone to talk to.  Andrea Dimes RN, BSN [place]  Value-Based Care Institute [facility] Health RN Care Manager [phone]

## 2023-12-05 NOTE — Patient Instructions (Signed)
 Visit Information  Thank you for taking time to visit with me today. Please don't hesitate to contact me if I can be of assistance to you before our next scheduled appointment.  Your next care management appointment is by telephone on 12/26/2023 at 11:00 am   Please call the care guide team at [phone] if you need to cancel, schedule, or reschedule an appointment.   Please call the Suicide and Crisis Lifeline: 988 go to [facility] Urgent [facility] [address], [place] [phone]) call 911 if you are experiencing a Mental Health or Behavioral Health Crisis or need someone to talk to.  Ronald Banks CHARM Maranda HEDWIG, PhD [facility], [facility] Social Worker Direct Dial: [phone]  Fax: [phone]

## 2023-12-05 NOTE — Patient Outreach (Addendum)
 Social Drivers of Health  Community Resource and Care Coordination Visit Note   12/05/2023  Name: Ronald Banks MRN: [id number] DOB:1975-01-26  Situation: Referral received for [facility] needs assessment and assistance related to Housing . I obtained verbal consent from Patient.  Visit completed with Patient on the phone.   Background:      Assessment:   Goals Addressed             This Visit's Progress    BSW VBCI Social Work Care Plan       Problems:   Housing   CSW Clinical Goal(s):   Over the next 1 weeks the Patient will will follow up with [employer] and disability and shelters in [place] as directed by Social Work.  Interventions:  Patient was provided shelter information   Patient Goals/Self-Care Activities:  Coordinate with [employer] and Disability case worker to assist with housing and disability.  Plan:   Telephone follow up appointment with care management team member scheduled for:  12/26/2023 at 11:00 am        Recommendation:   follow up with [facility] regarding housing needs SW educated the patient Ronald Banks, and how that the program may want him to attend church the patient declined and stated he did not want to attend. The patient still wants the SW to email him the information about Ronald Banks. Patient stated that his brother gave him money for 2 nights (Mon and Tues), and he was out trying to come up with money for tonight, he has $40 and he was coming up with $30 more, he stated that his brother can not give him anymore and he did speak with the [employer] to see if they needed any help in exchange for a room, but they did not have any work for him SW reminded the patient about the [facility] on 12/05/2023 at 1:15 pm..    Follow Up Plan:   Telephone follow up appointment date/time:  12/26/2023 at 11:00 am  Ronald Banks Ronald HEDWIG, PhD [facility], [facility] Social  Worker Direct Dial: [phone]  Fax: [phone]

## 2023-12-05 NOTE — Transitions of Care (Post Inpatient/ED Visit) (Signed)
 Transition of Care week 4  Visit Note  12/05/2023  Name: Ronald Banks MRN: [id number]          DOB: 06-05-1975  Situation: Patient enrolled in [facility] 30-day program. Visit completed with Ronald Banks by telephone.   Background:   Initial Transition Care Management Follow-up Telephone Call Discharge Date and Diagnosis: 11/12/23, Suicide attempt   Past Medical History:  Diagnosis Date   ADHD (attention deficit hyperactivity disorder), inattentive type    Chronic midline low back pain with right-sided sciatica 02/11/2018   Chronic right shoulder pain 02/11/2018   DKA (diabetic ketoacidosis) 08/17/2020   DOE (dyspnea on exertion) 05/19/2019   Onset 2015/16 05/19/2019   Walked RA x two laps =  approx 553ft @ brisk pace - stopped due to end of study with sats of 98 % at the end of the study and c/o sob s cp  > rec CPST next  - alpha one AT  Screen   05/19/19 MM  152  - CPST  06/30/19  FEV1 3.79 (93%) with ratio 0.73 and nl f/v loop: ex study  probabably  wnl when wt factored  In  - sob with ventilatory reserve suggested sub max effort and de   Hyperlipidemia due to type 1 diabetes mellitus 04/18/2018   Hypertension associated with diabetes 04/18/2018   Trial off acei 04/07/2019 due to cough > improved by 07/03/2019 with just a typical smoker's rattle (no longer cough to point of gag/vomit)   Hypothyroidism    Major depressive disorder 10/09/2019   Moderate nonproliferative diabetic retinopathy of both eyes (HCC) 05/19/2019   Tobacco dependence 10/09/2019   Type 1 diabetes mellitus with diabetic polyneuropathy 04/18/2018    Assessment: Patient Reported Symptoms: Cognitive Cognitive Status: Able to follow simple commands, Alert and oriented to person, place, and time, Normal speech and language skills      Neurological Neurological Review of Symptoms: Dizziness Neurological Management Strategies: Coping strategies, Adequate rest Neurological Self-Management Outcome: 3 (uncertain) Neurological  Comment: Dizzy spells when getting up and moving around  HEENT HEENT Symptoms Reported: No symptoms reported      Cardiovascular Cardiovascular Symptoms Reported: No symptoms reported    Respiratory Respiratory Symptoms Reported: No symptoms reported    Endocrine Endocrine Symptoms Reported: No symptoms reported Is patient diabetic?: Yes Is patient checking blood sugars at home?: Yes List most recent blood sugar readings, include date and time of day: Fasting today 64, Current BS 158 Endocrine Self-Management Outcome: 4 (good) Endocrine Comment: Patient using Dexcom, Novolog  via insulin  pump  Gastrointestinal Gastrointestinal Symptoms Reported: No symptoms reported      Genitourinary Genitourinary Symptoms Reported: No symptoms reported    Integumentary Integumentary Symptoms Reported: No symptoms reported    Musculoskeletal Musculoskelatal Symptoms Reviewed: No symptoms reported        Psychosocial Psychosocial Symptoms Reported: Depression - if selected complete PHQ 2-9 Additional Psychological Details: Patient has feelings of depression when he thinks about his housing situation. Offered referral to LCSW-patient agreed.         There were no vitals filed for this visit.    Medications Reviewed Today     Reviewed by Lucky Andrea LABOR, RN (Registered Nurse) on 12/05/23 at 1327  Med List Status: <None>   Medication Order Taking? Sig Documenting Provider Last Dose Status Informant  atorvastatin  (LIPITOR) 10 MG tablet [id number]  Take 10 mg by mouth daily.  Patient not taking: Reported on 12/05/2023   [provider]  Active Other  DULoxetine  (CYMBALTA )  30 MG capsule [id number] Yes Take 3 capsules (90 mg total) by mouth daily. Jadapalle, Sree, MD  Active   folic acid (FOLVITE) 1 MG tablet [id number]  Take 1 tablet (1 mg total) by mouth daily.  Patient not taking: Reported on 12/05/2023   Jadapalle, Sree, MD  Active   insulin  aspart (NOVOLOG ) 100 UNIT/ML injection  [id number] Yes Inject 0-5 Units into the skin at bedtime. Jens Durand, MD  Active   insulin  aspart (NOVOLOG ) 100 UNIT/ML injection [id number] Yes Inject 0-9 Units into the skin 3 (three) times daily with meals. Jens Durand, MD  Active   insulin  aspart (NOVOLOG ) 100 UNIT/ML injection [id number] Yes Inject 8 Units into the skin 3 (three) times daily with meals. Jadapalle, Sree, MD  Active   insulin  glargine (LANTUS) 100 UNIT/ML injection [id number]  Inject 0.2 mLs (20 Units total) into the skin daily.  Patient not taking: Reported on 12/05/2023   Jadapalle, Sree, MD  Active   levothyroxine  (SYNTHROID ) 125 MCG tablet [id number] Yes Take 2 tablets by mouth daily. [provider]  Active Other  Multiple Vitamin (MULTIVITAMIN WITH MINERALS) TABS tablet [id number]  Take 1 tablet by mouth daily.  Patient not taking: Reported on 12/05/2023   Jadapalle, Sree, MD  Active   thiamine (VITAMIN B-1) 100 MG tablet [id number]  Take 1 tablet (100 mg total) by mouth daily.  Patient not taking: Reported on 12/05/2023   Jadapalle, Sree, MD  Active             Recommendation:   Continue Current Plan of Care  Follow Up Plan:   Telephone follow-up in 1 week  Andrea Dimes RN, BSN [place]  Value-Based Care Institute [facility] Health RN Care Manager [phone]

## 2023-12-12 ENCOUNTER — Encounter: Payer: Self-pay | Admitting: *Deleted

## 2023-12-12 ENCOUNTER — Telehealth: Payer: Self-pay | Admitting: *Deleted

## 2023-12-13 ENCOUNTER — Encounter: Payer: Self-pay | Admitting: *Deleted

## 2023-12-17 ENCOUNTER — Emergency Department

## 2023-12-17 ENCOUNTER — Encounter: Payer: Self-pay | Admitting: Emergency Medicine

## 2023-12-17 ENCOUNTER — Other Ambulatory Visit: Payer: Self-pay

## 2023-12-17 ENCOUNTER — Ambulatory Visit: Payer: Self-pay

## 2023-12-17 ENCOUNTER — Emergency Department
Admission: EM | Admit: 2023-12-17 | Discharge: 2023-12-17 | Disposition: A | Discharge status: Home / Self Care | Attending: Emergency Medicine | Admitting: Emergency Medicine

## 2023-12-17 DIAGNOSIS — I10 Essential (primary) hypertension: Secondary | ICD-10-CM | POA: Insufficient documentation

## 2023-12-17 DIAGNOSIS — E1165 Type 2 diabetes mellitus with hyperglycemia: Secondary | ICD-10-CM | POA: Diagnosis not present

## 2023-12-17 DIAGNOSIS — Z9641 Presence of insulin pump (external) (internal): Secondary | ICD-10-CM | POA: Insufficient documentation

## 2023-12-17 DIAGNOSIS — R42 Dizziness and giddiness: Secondary | ICD-10-CM | POA: Insufficient documentation

## 2023-12-17 LAB — URINALYSIS, ROUTINE W REFLEX MICROSCOPIC
Bacteria, UA: NONE SEEN
Bilirubin Urine: NEGATIVE
Glucose, UA: >=500 mg/dL — AB
Ketones, ur: 5 mg/dL — AB
Leukocytes,Ua: NEGATIVE
Nitrite: NEGATIVE
Protein, ur: NEGATIVE mg/dL
Specific Gravity, Urine: 1.033 — ABNORMAL HIGH (ref 1.005–1.030)
Squamous Epithelial / HPF: 0 /HPF (ref 0–5)
pH: 5 (ref 5.0–8.0)

## 2023-12-17 LAB — COMPREHENSIVE METABOLIC PANEL WITH GFR
ALT: 15 U/L (ref 0–44)
AST: 20 U/L (ref 15–41)
Albumin: 4.4 g/dL (ref 3.5–5.0)
Alkaline Phosphatase: 75 U/L (ref 38–126)
Anion gap: 9 (ref 5–15)
BUN: 17 mg/dL (ref 6–20)
CO2: 27 mmol/L (ref 22–32)
Calcium: 9.6 mg/dL (ref 8.9–10.3)
Chloride: 100 mmol/L (ref 98–111)
Creatinine, Ser: 1.07 mg/dL (ref 0.61–1.24)
GFR, Estimated: >60 mL/min (ref >60)
Glucose, Bld: 307 mg/dL — ABNORMAL HIGH (ref 70–99)
Potassium: 4.3 mmol/L (ref 3.5–5.1)
Sodium: 136 mmol/L (ref 135–145)
Total Bilirubin: 0.3 mg/dL (ref 0.0–1.2)
Total Protein: 7.1 g/dL (ref 6.5–8.1)

## 2023-12-17 LAB — CBC
HCT: 40.8 % (ref 39.0–52.0)
Hemoglobin: 13.7 g/dL (ref 13.0–17.0)
MCH: 31.1 pg (ref 26.0–34.0)
MCHC: 33.6 g/dL (ref 30.0–36.0)
MCV: 92.7 fL (ref 80.0–100.0)
Platelets: 336 K/uL (ref 150–400)
RBC: 4.4 MIL/uL (ref 4.22–5.81)
RDW: 12.6 % (ref 11.5–15.5)
WBC: 10.1 K/uL (ref 4.0–10.5)
nRBC: 0 % (ref 0.0–0.2)

## 2023-12-17 LAB — CBG MONITORING, ED: Glucose-Capillary: 225 mg/dL — ABNORMAL HIGH (ref 70–99)

## 2023-12-17 MED ORDER — DEXCOM G7 SENSOR MISC: 0 refills | Status: AC

## 2023-12-17 MED ORDER — MECLIZINE HCL 25 MG PO TABS: 25.0000 mg | ORAL_TABLET | Freq: Three times a day (TID) | ORAL | 0 refills | Status: AC | PRN

## 2023-12-17 MED ORDER — MECLIZINE HCL 25 MG PO TABS
25.0000 mg | ORAL_TABLET | Freq: Once | ORAL | Status: AC
  Administered 2023-12-17: 25 mg via ORAL
  Filled 2023-12-17: qty 1

## 2023-12-17 NOTE — ED Provider Notes (Signed)
 [facility] Provider Note    Event Date/Time   First MD Initiated Contact with Patient 12/17/23 1721     (approximate)  History   Chief Complaint: Dizziness  HPI  Ronald Banks is a 48 y.o. male with a past medical history of diabetes, hypertension, hyperlipidemia, presents to the emergency department for dizziness.  According to the patient over the last month or 2 he has been experiencing intermittent spells of dizziness which he describes as the room spinning around him.  Patient denies any urinary symptoms.  Patient has an insulin  pump and says his blood sugars have been running between 2 and 300.  He states he gets dizzy when his blood sugar drops and he tries to keep them elevated in that range.  Denies any chest pain.  Denies any recent illnesses.  Physical Exam   Triage Vital Signs: ED Triage Vitals  Encounter Vitals Group     BP 12/17/23 1421 (!) 152/79     Girls Systolic BP Percentile --      Girls Diastolic BP Percentile --      Boys Systolic BP Percentile --      Boys Diastolic BP Percentile --      Pulse Rate 12/17/23 1421 80     Resp 12/17/23 1421 18     Temp 12/17/23 1421 97.9 F (36.6 C)     Temp Source 12/17/23 1421 Oral     SpO2 12/17/23 1421 98 %     Weight --      Height --      Head Circumference --      Peak Flow --      Pain Score 12/17/23 1420 8     Pain Loc --      Pain Education --      Exclude from Growth Chart --     Most recent vital signs: Vitals:   12/17/23 1421 12/17/23 1723  BP: (!) 152/79 132/79  Pulse: 80 66  Resp: 18 18  Temp: 97.9 F (36.6 C)   SpO2: 98% 99%    General: Awake, no distress.  CV:  Good peripheral perfusion.  Regular rate and rhythm  Resp:  Normal effort.  Equal breath sounds bilaterally.  Abd:  No distention.  Soft, nontender.  No rebound or guarding.  ED Results / Procedures / Treatments   EKG  EKG viewed and interpreted by myself shows a normal sinus rhythm at 76 bpm with a  narrow QRS, normal axis, normal intervals, no concerning ST changes.  RADIOLOGY  I have reviewed interpreted CT head images.  No bleed or significant abnormality seen on my evaluation. Radiology has read the CT scan as negative.   MEDICATIONS ORDERED IN ED: Medications - No data to display   IMPRESSION / MDM / ASSESSMENT AND PLAN / ED COURSE  I reviewed the triage vital signs and the nursing notes.  Patient's presentation is most consistent with acute presentation with potential threat to life or bodily function.  Patient presents emergency department for intermittent dizziness occurring over the last 2 months or so.  Patient states about a year or 2 ago he had surgery performed on both ears has noted some dizziness ever since.  Dizziness has been worse over the past 1 month occurring almost on a daily basis sometimes multiple times per day.  Overall the patient appears well, reassuring CBC reassuring chemistry besides mild hyperglycemia but reassuringly normal anion gap and kidney function.  Recheck of his  blood sugar shows 225.  CT scan of the head shows no acute finding EKG shows no significant finding either.  I discussed with the patient to more closely monitor his blood sugar and adjust insulin  as needed.  We will provide a prescription for Dexcom monitor refills.  Patient's symptoms seem somewhat suggestive of vertigo he states sometimes he has noted a change in position although other times he has not noted that.  I believe a trial of meclizine  and then have the patient follow-up with ENT would be warranted.  Patient agreeable to plan of care.  FINAL CLINICAL IMPRESSION(S) / ED DIAGNOSES   Dizziness  Note:  This document was prepared using Dragon voice recognition software and may include unintentional dictation errors.   Dorothyann Drivers, MD 12/17/23 [phone]
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
## 2023-12-17 NOTE — ED Notes (Signed)
 Pt requesting for RN to speak with probation officer to verify pt is at hospital. Pt still on phone with probation officer.

## 2023-12-17 NOTE — ED Notes (Signed)
 ED Provider at bedside.

## 2023-12-17 NOTE — Discharge Instructions (Addendum)
 Please call the number provided for ENT to arrange a follow-up appointment.  Return to the emergency department for any symptom personally concerning to yourself.  Please take your meclizine  as needed for dizziness as prescribed.  Please closely monitor your blood sugar using her Dexcom device.

## 2023-12-17 NOTE — ED Triage Notes (Signed)
 Pt to ED via POV from home. Pt reports dizzy spells for the past week with a HA and back pain. Pt denies etoh or drug use.

## 2023-12-26 ENCOUNTER — Encounter: Payer: Self-pay | Admitting: Licensed Clinical Social Worker

## 2023-12-26 ENCOUNTER — Other Ambulatory Visit: Payer: Self-pay | Admitting: Licensed Clinical Social Worker

## 2023-12-26 NOTE — Patient Outreach (Signed)
 SW attempted to follow up multiple time along with the [facility] and again a no a show. SW will close case out.   Tobias CHARM Maranda HEDWIG, PhD [facility], [facility] Social Worker Direct Dial: [phone]  Fax: [phone]

## 2023-12-26 NOTE — Patient Instructions (Signed)
 Visit Information  Thank you for taking time to visit with me today. Please don't hesitate to contact me if I can be of assistance to you before our next scheduled appointment.  Your next care management appointment is no further scheduled appointments.  on     Please call the care guide team at [phone] if you need to cancel, schedule, or reschedule an appointment.   Please call the Suicide and Crisis Lifeline: 988 go to [facility] Urgent [facility] [address], [place] [phone]) call 911 if you are experiencing a Mental Health or Behavioral Health Crisis or need someone to talk to.  Tobias CHARM Maranda HEDWIG, PhD [facility], [facility] Social Worker Direct Dial: [phone]  Fax: [phone]

## 2024-01-01 ENCOUNTER — Ambulatory Visit: Payer: Self-pay

## 2024-01-01 ENCOUNTER — Ambulatory Visit: Payer: Self-pay | Admitting: Internal Medicine

## 2024-01-01 NOTE — Telephone Encounter (Signed)
 FYI Only or Action Required?: FYI only for provider: appointment scheduled on 01/04/24.  Patient was last seen in primary care on 05/02/2023 by [name] ORN, NP.  Called Nurse Triage reporting Back Pain.  Symptoms began several months ago.  Interventions attempted: OTC medications: Tylenol , Ibuprofen .  Symptoms are: stable.  Triage Disposition: See PCP Within 2 Weeks  Patient/caregiver understands and will follow disposition?: Yes Reason for Disposition  Back pain present > 2 weeks  Answer Assessment - Initial Assessment Questions Patient missed appointment today for dizziness/ headaches, calling to reschedule and mentioned back pain. Rescheduled 12/12 with PCP  1. ONSET: When did the pain begin? (e.g., minutes, hours, days)     7-8 months  2. LOCATION: Where does it hurt? (upper, mid or lower back)     Lower back , left side  3. SEVERITY: How bad is the pain?  (e.g., Scale 1-10; mild, moderate, or severe)     9/10  4. PATTERN: Is the pain constant? (e.g., yes, no; constant, intermittent)      Constant  5. RADIATION: Does the pain shoot into your legs or somewhere else?     Left leg today, but usually right leg  6. CAUSE:  What do you think is causing the back pain?      Unsure  7. BACK OVERUSE:  Any recent lifting of heavy objects, strenuous work or exercise?     Denies  8. MEDICINES: What have you taken so far for the pain? (e.g., nothing, acetaminophen , NSAIDS)     Tylenol , Ibuprofen   9. NEUROLOGIC SYMPTOMS: Do you have any weakness, numbness, or problems with bowel/bladder control?     Denies  10. OTHER SYMPTOMS: Do you have any other symptoms? (e.g., fever, abdomen pain, burning with urination, blood in urine)       Sometimes legs get numb when walking  Protocols used: Back Pain-A-AH    Copied from CRM #[id number]. Topic: Clinical - Red Word Triage >> Jan 01, 2024  1:33 PM Montie POUR wrote: Red Word that prompted transfer to Nurse Triage:    complaints of dizziness with headaches for past 2-3 mths He does have back pain - runs down left leg - pain level is a 9

## 2024-01-04 ENCOUNTER — Ambulatory Visit: Payer: Self-pay | Admitting: Internal Medicine
